# Patient Record
Sex: Female | Born: 1940
Health system: Southern US, Community
[De-identification: ages and names within clinical notes are randomized; demographics above are authoritative.]

## PROBLEM LIST (undated history)

## (undated) DIAGNOSIS — M818 Other osteoporosis without current pathological fracture: Secondary | ICD-10-CM

## (undated) DIAGNOSIS — K219 Gastro-esophageal reflux disease without esophagitis: Secondary | ICD-10-CM

## (undated) DIAGNOSIS — R231 Pallor: Secondary | ICD-10-CM

## (undated) DIAGNOSIS — E042 Nontoxic multinodular goiter: Secondary | ICD-10-CM

## (undated) DIAGNOSIS — C8318 Mantle cell lymphoma, lymph nodes of multiple sites: Secondary | ICD-10-CM

## (undated) DIAGNOSIS — J449 Chronic obstructive pulmonary disease, unspecified: Secondary | ICD-10-CM

## (undated) DIAGNOSIS — E611 Iron deficiency: Secondary | ICD-10-CM

## (undated) DIAGNOSIS — E0520 Thyrotoxicosis with toxic multinodular goiter without thyrotoxic crisis or storm: Secondary | ICD-10-CM

## (undated) DIAGNOSIS — E66811 Obesity, class 1: Secondary | ICD-10-CM

## (undated) DIAGNOSIS — E559 Vitamin D deficiency, unspecified: Secondary | ICD-10-CM

## (undated) DIAGNOSIS — E211 Secondary hyperparathyroidism, not elsewhere classified: Secondary | ICD-10-CM

## (undated) DIAGNOSIS — E669 Obesity, unspecified: Secondary | ICD-10-CM

## (undated) DIAGNOSIS — R432 Parageusia: Secondary | ICD-10-CM

## (undated) DIAGNOSIS — E063 Autoimmune thyroiditis: Secondary | ICD-10-CM

## (undated) DIAGNOSIS — I10 Essential (primary) hypertension: Secondary | ICD-10-CM

## (undated) DIAGNOSIS — Z8572 Personal history of non-Hodgkin lymphomas: Secondary | ICD-10-CM

## (undated) DIAGNOSIS — Z85038 Personal history of other malignant neoplasm of large intestine: Secondary | ICD-10-CM

## (undated) DIAGNOSIS — E782 Mixed hyperlipidemia: Secondary | ICD-10-CM

## (undated) HISTORY — DX: Nontoxic multinodular goiter: E04.2

## (undated) HISTORY — DX: Autoimmune thyroiditis: E06.3

## (undated) HISTORY — DX: Personal history of non-Hodgkin lymphomas: Z85.72

## (undated) HISTORY — DX: Iron deficiency: E61.1

## (undated) HISTORY — DX: Mantle cell lymphoma, lymph nodes of multiple sites: C83.18

## (undated) HISTORY — DX: Thyrotoxicosis with toxic multinodular goiter without thyrotoxic crisis or storm: E05.20

## (undated) HISTORY — DX: Mixed hyperlipidemia: E78.2

## (undated) HISTORY — DX: Other osteoporosis without current pathological fracture: M81.8

## (undated) HISTORY — PX: HAND SURGERY: SHX662

## (undated) HISTORY — DX: Personal history of other malignant neoplasm of large intestine: Z85.038

## (undated) HISTORY — DX: Hypercalcemia: E83.52

## (undated) HISTORY — DX: Pallor: R23.1

## (undated) HISTORY — DX: Vitamin D deficiency, unspecified: E55.9

## (undated) HISTORY — DX: Gastro-esophageal reflux disease without esophagitis: K21.9

## (undated) HISTORY — DX: Secondary hyperparathyroidism, not elsewhere classified: E21.1

## (undated) HISTORY — DX: Obesity, unspecified: E66.9

## (undated) HISTORY — DX: Chronic obstructive pulmonary disease, unspecified: J44.9

## (undated) HISTORY — DX: Obesity, class 1: E66.811

## (undated) HISTORY — DX: Parageusia: R43.2

## (undated) HISTORY — DX: Essential (primary) hypertension: I10

---

## 1998-07-16 ENCOUNTER — Ambulatory Visit (HOSPITAL_COMMUNITY): Admission: RE | Admit: 1998-07-16 | Discharge: 1998-07-16 | Payer: Self-pay | Admitting: Obstetrics & Gynecology

## 2005-06-29 ENCOUNTER — Ambulatory Visit: Payer: Self-pay | Admitting: "Endocrinology

## 2005-09-27 ENCOUNTER — Ambulatory Visit: Payer: Self-pay | Admitting: "Endocrinology

## 2005-12-26 ENCOUNTER — Ambulatory Visit: Payer: Self-pay | Admitting: "Endocrinology

## 2006-05-09 ENCOUNTER — Ambulatory Visit: Payer: Self-pay | Admitting: "Endocrinology

## 2006-10-16 ENCOUNTER — Ambulatory Visit: Payer: Self-pay | Admitting: "Endocrinology

## 2007-04-17 ENCOUNTER — Ambulatory Visit: Payer: Self-pay | Admitting: "Endocrinology

## 2007-10-16 ENCOUNTER — Ambulatory Visit: Payer: Self-pay | Admitting: "Endocrinology

## 2008-01-29 ENCOUNTER — Ambulatory Visit: Payer: Self-pay | Admitting: "Endocrinology

## 2008-05-27 ENCOUNTER — Encounter: Payer: Self-pay | Admitting: Cardiovascular Disease

## 2008-06-02 ENCOUNTER — Encounter: Payer: Self-pay | Admitting: Cardiovascular Disease

## 2008-06-06 ENCOUNTER — Ambulatory Visit: Payer: Self-pay | Admitting: "Endocrinology

## 2008-06-11 ENCOUNTER — Encounter: Payer: Self-pay | Admitting: Cardiovascular Disease

## 2008-12-08 ENCOUNTER — Ambulatory Visit: Payer: Self-pay | Admitting: "Endocrinology

## 2009-01-14 ENCOUNTER — Encounter: Admission: RE | Admit: 2009-01-14 | Discharge: 2009-01-14 | Payer: Self-pay | Admitting: "Endocrinology

## 2009-06-01 ENCOUNTER — Ambulatory Visit: Payer: Self-pay | Admitting: "Endocrinology

## 2009-11-24 ENCOUNTER — Ambulatory Visit: Payer: Self-pay | Admitting: "Endocrinology

## 2009-12-15 ENCOUNTER — Encounter: Payer: Self-pay | Admitting: Cardiovascular Disease

## 2010-01-11 ENCOUNTER — Encounter: Payer: Self-pay | Admitting: Cardiovascular Disease

## 2010-02-11 ENCOUNTER — Ambulatory Visit: Payer: Self-pay | Admitting: Cardiovascular Disease

## 2010-02-11 DIAGNOSIS — E782 Mixed hyperlipidemia: Secondary | ICD-10-CM

## 2010-02-11 DIAGNOSIS — R079 Chest pain, unspecified: Secondary | ICD-10-CM

## 2010-02-11 DIAGNOSIS — I10 Essential (primary) hypertension: Secondary | ICD-10-CM | POA: Insufficient documentation

## 2010-02-11 DIAGNOSIS — R0602 Shortness of breath: Secondary | ICD-10-CM | POA: Insufficient documentation

## 2010-02-11 HISTORY — DX: Chest pain, unspecified: R07.9

## 2010-02-11 HISTORY — DX: Mixed hyperlipidemia: E78.2

## 2010-05-24 ENCOUNTER — Ambulatory Visit: Payer: Self-pay | Admitting: "Endocrinology

## 2010-08-19 ENCOUNTER — Encounter: Payer: Self-pay | Admitting: Cardiology

## 2010-08-24 ENCOUNTER — Encounter: Payer: Self-pay | Admitting: Cardiology

## 2010-10-04 ENCOUNTER — Encounter: Payer: Self-pay | Admitting: Cardiology

## 2010-10-12 ENCOUNTER — Ambulatory Visit
Admission: RE | Admit: 2010-10-12 | Discharge: 2010-10-12 | Payer: Self-pay | Source: Home / Self Care | Attending: Cardiology | Admitting: Cardiology

## 2010-10-12 ENCOUNTER — Encounter: Payer: Self-pay | Admitting: Cardiology

## 2010-10-12 DIAGNOSIS — I77810 Thoracic aortic ectasia: Secondary | ICD-10-CM

## 2010-10-12 DIAGNOSIS — I712 Thoracic aortic aneurysm, without rupture, unspecified: Secondary | ICD-10-CM | POA: Insufficient documentation

## 2010-10-12 HISTORY — DX: Thoracic aortic aneurysm, without rupture, unspecified: I71.20

## 2010-10-12 HISTORY — DX: Thoracic aortic ectasia: I77.810

## 2010-10-14 NOTE — Consult Note (Signed)
Summary: [facility]   Imported By: Marylou Mccoy 02/10/2010 11:51:39  _____________________________________________________________________  External Attachment:    Type:   Image     Comment:   External Document

## 2010-10-14 NOTE — Letter (Signed)
Summary: Dr Glean Hess Revankar's Office Note  Dr Glean Hess Revankar's Office Note   Imported By: Roderic Ovens 03/01/2010 10:13:30  _____________________________________________________________________  External Attachment:    Type:   Image     Comment:   External Document

## 2010-10-14 NOTE — Assessment & Plan Note (Signed)
Summary: NP3/CHEST PAIN-MB   Primary Provider:  Dr. Lucianne Lei  CC:  chest pain.  History of Present Illness: Joy Daniels is seen today at the request of Dr Mathis Bud.  She has had a couple of episodes of atypical SSCP.  The first episode ? a couple of months ago was in the setting of a bronchitic/COPD exacerbation.  It had a slight pleuritic componant and was right sided. It resolved with the bronchitis.  She had a little more pain 2 weeks latter that was self limited and non exertional but not pleuritic this time.  Her history is difficult as she appears to have some memory problems.  She has also been under some stress as her 72 yo daughter and 4 children alre living with her due to credit card problems.  She is currently not having any SSCP.  CRF include well Rx HTN and elevated lipids.  Her activity is limited by COPD/dyspnea and right knee pain.  She has had a recent injection by Dr Mathis Bud.    I reviewed notes from Dr Henrietta Hoover who saw her in 05/2008 for atypical pain.  She had a normal stress myovue at that time.  Since her ECG is normal, symptoms atypical and resolved with normal myovue in 2009 I dont think further w/u is necessary at this time.   Current Problems (verified): None  Current Medications (verified): 1)  Advair Diskus 250-50 Mcg/dose Aepb (Fluticasone-Salmeterol) .... As Directed 2)  Calcitriol 0.25 Mcg Caps (Calcitriol) .Marland Kitchen.. 1 Tab By Mouth Once Daily 3)  Furosemide 20 Mg Tabs (Furosemide) .... Take One Tablet By Mouth Daily. 4)  Losartan Potassium-Hctz 100-25 Mg Tabs (Losartan Potassium-Hctz) .Marland Kitchen.. 1 Tab By Mouth Once Daily 5)  Citracal/vitamin D 250-200 Mg-Unit Tabs (Calcium Citrate-Vitamin D) .Marland Kitchen.. 1 Tab By Mouth Once Daily 6)  Metoprolol Succinate 50 Mg Xr24h-Tab (Metoprolol Succinate) .... Take One Tablet By Mouth Daily 7)  Omeprazole 40 Mg Cpdr (Omeprazole) .Marland Kitchen.. 1 Tab By Mouth Once Daily 8)  Proair Hfa 108 (90 Base) Mcg/act Aers (Albuterol Sulfate) .... As Directed 9)  Potassium  Chloride Cr 10 Meq Cr-Caps (Potassium Chloride) .Marland Kitchen.. 1 Tab By Mouth Once Daily 10)  Singulair 10 Mg Tabs (Montelukast Sodium) .Marland Kitchen.. 1 Tab By Mouth Once Daily 11)  Sertraline Hcl 50 Mg Tabs (Sertraline Hcl) .Marland Kitchen.. 1 Tab By Mouth Once Daily 12)  Vesicare 10 Mg Tabs (Solifenacin Succinate) .Marland Kitchen.. 1 Tab By Mouth Once Daily 13)  Centrum Silver  Tabs (Multiple Vitamins-Minerals) .Marland Kitchen.. 1 Tab By Mouth Once Daily 14)  Vitamin B-12 .Marland Kitchen.. 1 Tab By Mouth Once Daily 15)  Asmanex 30 Metered Doses 220 Mcg/inh Aepb (Mometasone Furoate) .... 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   Vital Signs:  Patient profile:   70 year old female Height:  60 inches Weight:      164 pounds BMI:     32.14 Pulse rate:   88 / minute Resp:     12 per minute BP sitting:   112 / 76  (left arm)  Vitals Entered By: Kem Parkinson (February 11, 2010 3:58 PM)   Impression & Recommendations:  Problem # 1:  DYSPNEA (ICD-786.05) COPD/bronchitis no evidence of cor pulmonale or CHF Her updated medication list for this problem includes:    Furosemide 20 Mg  Tabs (Furosemide) .Marland Kitchen... Take one tablet by mouth daily.    Losartan Potassium-hctz 100-25 Mg Tabs (Losartan potassium-hctz) .Marland Kitchen... 1 tab by mouth once daily    Metoprolol Succinate 50 Mg Xr24h-tab (Metoprolol succinate) .Marland Kitchen... Take one tablet by mouth daily  Problem # 2:  MIXED HYPERLIPIDEMIA (ICD-272.2) Diet Rx F/U primary for lipds  Problem # 3:  ESSENTIAL HYPERTENSION, BENIGN (ICD-401.1) Well controlled Her updated medication list for this problem includes:    Furosemide 20 Mg Tabs (Furosemide) .Marland Kitchen... Take one tablet by mouth daily.    Losartan Potassium-hctz 100-25 Mg Tabs (Losartan potassium-hctz) .Marland Kitchen... 1 tab by mouth once daily    Metoprolol Succinate 50 Mg Xr24h-tab (Metoprolol succinate) .Marland Kitchen... Take one tablet by mouth daily  Problem # 4:  CHEST PAIN UNSPECIFIED (ICD-786.50) Resolved related to COPD flair.  Normal ECG and nonischemic myovue 2009  Continue ASA and BB Observe Her updated medication list for this problem includes:    Metoprolol Succinate 50 Mg Xr24h-tab (Metoprolol succinate) .Marland Kitchen... Take one tablet by mouth daily  Patient Instructions: 1)  Your physician recommends that you schedule a follow-up appointment in: AS NEEDED 2)  FOR KNEE SURGERY-DR FRANK ALUISIO OR DR Lajoyce Corners   EKG Report  Procedure date:  02/11/2010  Findings:      NSR 88 Poor R wave progression ? lead placement Voltage criteria for LVH Otherwise normal

## 2010-10-14 NOTE — Consult Note (Signed)
Summary: [facility]   Imported By: Marylou Mccoy 02/10/2010 11:53:20  _____________________________________________________________________  External Attachment:    Type:   Image     Comment:   External Document

## 2010-10-20 NOTE — Assessment & Plan Note (Signed)
Summary: ec6. recently dx ca. sob. pt has humana gold choice. gd   Primary Provider:  Dr. Lucianne Lei   History of Present Illness: 70 year old female for evaluation of TAA. Myoview in September of 2009 showed an ejection fraction of 64% and normal perfusion. This was performed in [place]. Seen by Dr. Eden Emms in June of 2011 for dyspnea and this was felt secondary to COPD/bronchitis. Patient was recently diagnosed with thoracic aortic aneurysm. This occurred in the setting of a CT scan for mantle cell lymphoma. Apparently it measured 3.8 cm but those records are not available. We were therefore asked to evaluate. She does have chronic dyspnea on exertion but there is no orthopnea or PND. She has had mild edema in the right lower extremity from recent DVT. There is no chest pain, palpitations or syncope.  Current Medications (verified): 1)  Advair Diskus 250-50 Mcg/dose Aepb (Fluticasone-Salmeterol) .... As Directed 2)  Calcitriol 0.25 Mcg Caps (Calcitriol) .Marland Kitchen.. 1 Tab By Mouth Once Daily 3)  Losartan Potassium-Hctz 100-25 Mg Tabs (Losartan Potassium-Hctz) .Marland Kitchen.. 1 Tab By Mouth Once Daily 4)  Citracal/vitamin D 250-200 Mg-Unit Tabs (Calcium Citrate-Vitamin D) .Marland Kitchen.. 1 Tab By Mouth Once Daily 5)  Metoprolol Succinate 50 Mg Xr24h-Tab (Metoprolol Succinate) .... Take One Tablet By Mouth Daily 6)  Omeprazole 40 Mg Cpdr (Omeprazole) .Marland Kitchen.. 1 Tab By Mouth Once Daily 7)  Proair Hfa 108 (90 Base) Mcg/act Aers (Albuterol Sulfate) .... As Directed 8)  Potassium Chloride Cr 10 Meq Cr-Caps (Potassium Chloride) .Marland Kitchen.. 1 Tab By Mouth Once Daily 9)  Singulair 10 Mg Tabs (Montelukast Sodium) .Marland Kitchen.. 1 Tab By Mouth Once Daily 10)  Centrum Silver  Tabs (Multiple Vitamins-Minerals) .Marland Kitchen.. 1 Tab By Mouth Once Daily 11)  Voltaren 1 % Gel (Diclofenac Sodium) .... Uad 12)  Welchol 625 Mg Tabs (Colesevelam Hcl) .... Two Times A Day 13)  Prochlorperazine Maleate 10 Mg Tabs (Prochlorperazine Maleate) .... As Needed 14)  Miralax  Powd  (Polyethylene Glycol 3350) .... As Needed  Allergies (verified): No Known Drug Allergies  Past History:  Past Medical History: H/O dyslipidemeia H/O HTN GERD Hyperthyroid s/p radiation therapy Osteoporosis Allergic Rhinosinustitis Anxiety Postmanapusal H/O  chronic tinnitus S-N deafness Urinary incontinence Sleep apnea Recent DVT COPD Mantle cell lymphoma  Past Surgical History: No previous surgery  Family History: Reviewed history from 02/11/2010 and no changes required. Sister with MI at age 28 Family history of CVA  Social History: Reviewed history from 02/11/2010 and no changes required. Married Nonsmoker No ETOH Sedentary Daughter and her 4 grandchildren living with her  Review of Systems       Fatigue from Recent Chemotherapy but no fevers or chills, productive cough, hemoptysis, dysphasia, odynophagia, melena, hematochezia, dysuria, hematuria, rash, seizure activity, orthopnea, PND,  claudication. Remaining systems are negative.   Vital Signs:  Patient profile:   70 year old female Height:      60 inches Weight:      164 pounds Pulse rate:   97 / minute BP sitting:   120 / 60  (right arm)  Vitals Entered By: Laurance Flatten CMA (October 12, 2010 12:07 PM)  Physical Exam  General:  Well-developed well-nourished in no acute distress.  Skin is warm and dry.  HEENT is normal.  Neck is supple. No thyromegaly.  Chest is clear to auscultation with normal expansion.  Cardiovascular exam is regular rate and rhythm.  Abdominal exam nontender or distended. No masses palpated. Extremities show no edema. neuro grossly intact    EKG  Procedure date:  10/12/2010  Findings:      Sinus rhythm at a rate of 97. Left ventricular hypertrophy. Nonspecific ST changes.  Impression & Recommendations:  Problem # 1:  THORACIC AORTIC ANEURYSM (ICD-441.2) Will review the patient's CT scan and have records forwarded from [facility]. Per the patient it measured 3.8  cm. Plan followup study in one year. No indication for intervention.  Problem # 2:  DYSPNEA (ICD-786.05) Felt previously secondary to COPD. No change in symptoms. Previous negative Myoview. The following medications were removed from the medication list:    Furosemide 20 Mg Tabs (Furosemide) .Marland Kitchen... Take one tablet by mouth daily. Her updated medication list for this problem includes:    Losartan Potassium-hctz 100-25 Mg Tabs (Losartan potassium-hctz) .Marland Kitchen... 1 tab by mouth once daily    Metoprolol Succinate 50 Mg Xr24h-tab (Metoprolol succinate) .Marland Kitchen... Take one tablet by mouth daily  Problem # 3:  MIXED HYPERLIPIDEMIA (ICD-272.2) Management per primary care. Her updated medication list for this problem includes:    Welchol 625 Mg Tabs (Colesevelam hcl) .Marland Kitchen..Marland Kitchen Two times a day  Problem # 4:  ESSENTIAL HYPERTENSION, BENIGN (ICD-401.1) Blood pressure controlled. Continue present medications. Lipids and liver monitored by primary care. The following medications were removed from the medication list:    Furosemide 20 Mg Tabs (Furosemide) .Marland Kitchen... Take one tablet by mouth daily. Her updated medication list for this problem includes:    Losartan Potassium-hctz 100-25 Mg Tabs (Losartan potassium-hctz) .Marland Kitchen... 1 tab by mouth once daily    Metoprolol Succinate 50 Mg Xr24h-tab (Metoprolol succinate) .Marland Kitchen... Take one tablet by mouth daily

## 2010-10-25 ENCOUNTER — Telehealth (INDEPENDENT_AMBULATORY_CARE_PROVIDER_SITE_OTHER): Payer: Self-pay | Admitting: *Deleted

## 2010-11-03 NOTE — Progress Notes (Signed)
Summary: DVD from [facility]  Received DVD of CT Chest-Abd-Pelvis from 10/04/10 & CT Chest 08/31/10. Place in black box.

## 2010-11-09 NOTE — Letter (Signed)
[facility]: [facility]   Imported By: Marylou Mccoy 11/05/2010 15:44:14  _____________________________________________________________________  External Attachment:    Type:   Image     Comment:   External Document

## 2010-11-09 NOTE — Letter (Signed)
Summary: [facility]   Imported By: Marylou Mccoy 11/05/2010 15:51:40  _____________________________________________________________________  External Attachment:    Type:   Image     Comment:   External Document

## 2010-11-15 ENCOUNTER — Ambulatory Visit (INDEPENDENT_AMBULATORY_CARE_PROVIDER_SITE_OTHER): Payer: Medicare PPO | Admitting: Pediatrics

## 2010-11-15 DIAGNOSIS — E559 Vitamin D deficiency, unspecified: Secondary | ICD-10-CM

## 2010-11-15 DIAGNOSIS — E210 Primary hyperparathyroidism: Secondary | ICD-10-CM

## 2010-11-15 DIAGNOSIS — E063 Autoimmune thyroiditis: Secondary | ICD-10-CM

## 2011-01-27 ENCOUNTER — Encounter: Payer: Self-pay | Admitting: *Deleted

## 2011-04-28 ENCOUNTER — Other Ambulatory Visit: Payer: Self-pay | Admitting: "Endocrinology

## 2011-05-18 ENCOUNTER — Ambulatory Visit (INDEPENDENT_AMBULATORY_CARE_PROVIDER_SITE_OTHER): Payer: Medicare PPO | Admitting: "Endocrinology

## 2011-05-18 VITALS — BP 124/78 | HR 71 | Wt 173.0 lb

## 2011-05-18 DIAGNOSIS — E211 Secondary hyperparathyroidism, not elsewhere classified: Secondary | ICD-10-CM

## 2011-05-18 DIAGNOSIS — K3189 Other diseases of stomach and duodenum: Secondary | ICD-10-CM

## 2011-05-18 DIAGNOSIS — R432 Parageusia: Secondary | ICD-10-CM

## 2011-05-18 DIAGNOSIS — R439 Unspecified disturbances of smell and taste: Secondary | ICD-10-CM

## 2011-05-18 DIAGNOSIS — R1013 Epigastric pain: Secondary | ICD-10-CM

## 2011-05-18 DIAGNOSIS — E559 Vitamin D deficiency, unspecified: Secondary | ICD-10-CM

## 2011-05-18 DIAGNOSIS — E213 Hyperparathyroidism, unspecified: Secondary | ICD-10-CM

## 2011-05-18 DIAGNOSIS — E063 Autoimmune thyroiditis: Secondary | ICD-10-CM

## 2011-05-18 NOTE — Patient Instructions (Signed)
Followup visit in 6 months. Please continue to take her calcitriol, calcium, vitamin D.

## 2011-05-19 LAB — T3, FREE: T3, Free: 2.6 pg/mL (ref 2.3–4.2)

## 2011-05-19 LAB — TSH: TSH: 2.497 u[IU]/mL (ref 0.350–4.500)

## 2011-05-19 LAB — PTH, INTACT AND CALCIUM
Calcium, Total (PTH): 10.4 mg/dL (ref 8.4–10.5)
PTH: 64.2 pg/mL (ref 14.0–72.0)

## 2011-05-19 LAB — VITAMIN D 25 HYDROXY (VIT D DEFICIENCY, FRACTURES): Vit D, 25-Hydroxy: 36 ng/mL (ref 30–89)

## 2011-05-19 LAB — T4, FREE: Free T4: 1.04 ng/dL (ref 0.80–1.80)

## 2011-05-21 LAB — VITAMIN D 1,25 DIHYDROXY: Vitamin D2 1, 25 (OH)2: 17 pg/mL

## 2011-06-21 ENCOUNTER — Telehealth: Payer: Self-pay | Admitting: *Deleted

## 2011-06-21 NOTE — Telephone Encounter (Signed)
T/C to patient with 05/18/11 Lab results.  Per Dr. Fransico Michael: 1. 25 Vit. D and 1,25 Vit. D are normal. 2. TFT levels are normal. 3. PTH is high normal. 4. Follow-up as planned.  Patient requests copy of these results be faxed to Dr. Lucianne Lei.

## 2011-10-21 ENCOUNTER — Encounter: Payer: Self-pay | Admitting: "Endocrinology

## 2011-10-21 DIAGNOSIS — C8318 Mantle cell lymphoma, lymph nodes of multiple sites: Secondary | ICD-10-CM | POA: Insufficient documentation

## 2011-10-21 DIAGNOSIS — E211 Secondary hyperparathyroidism, not elsewhere classified: Secondary | ICD-10-CM | POA: Insufficient documentation

## 2011-10-21 DIAGNOSIS — I10 Essential (primary) hypertension: Secondary | ICD-10-CM | POA: Insufficient documentation

## 2011-10-21 DIAGNOSIS — E063 Autoimmune thyroiditis: Secondary | ICD-10-CM | POA: Insufficient documentation

## 2011-10-21 DIAGNOSIS — M818 Other osteoporosis without current pathological fracture: Secondary | ICD-10-CM | POA: Insufficient documentation

## 2011-10-21 DIAGNOSIS — E0520 Thyrotoxicosis with toxic multinodular goiter without thyrotoxic crisis or storm: Secondary | ICD-10-CM | POA: Insufficient documentation

## 2011-10-21 DIAGNOSIS — E669 Obesity, unspecified: Secondary | ICD-10-CM | POA: Insufficient documentation

## 2011-10-21 DIAGNOSIS — E782 Mixed hyperlipidemia: Secondary | ICD-10-CM | POA: Insufficient documentation

## 2011-10-21 DIAGNOSIS — R432 Parageusia: Secondary | ICD-10-CM | POA: Insufficient documentation

## 2011-10-21 DIAGNOSIS — E042 Nontoxic multinodular goiter: Secondary | ICD-10-CM | POA: Insufficient documentation

## 2011-10-21 DIAGNOSIS — E611 Iron deficiency: Secondary | ICD-10-CM | POA: Insufficient documentation

## 2011-10-21 DIAGNOSIS — K219 Gastro-esophageal reflux disease without esophagitis: Secondary | ICD-10-CM | POA: Insufficient documentation

## 2011-10-21 DIAGNOSIS — R231 Pallor: Secondary | ICD-10-CM | POA: Insufficient documentation

## 2011-10-21 DIAGNOSIS — J449 Chronic obstructive pulmonary disease, unspecified: Secondary | ICD-10-CM | POA: Insufficient documentation

## 2011-10-21 DIAGNOSIS — E559 Vitamin D deficiency, unspecified: Secondary | ICD-10-CM | POA: Insufficient documentation

## 2011-10-21 NOTE — Progress Notes (Addendum)
Subjective:  Patient Name: Joy Daniels Date of Birth: [birth year]  MRN: [id number]  Viha Kriegel  presents to the office today for follow-up of her secondary hyperparathyroidism, hypocalcemia, vitamin D deficiency, iron deficiency, dysgeusia, dyspepsia, and nontoxic multinodular goiter.  HISTORY OF PRESENT ILLNESS:   Joy Daniels is a 71 y.o. Caucasian woman.  Joy Daniels was accompanied by her sister.   1. The patient was first referred to me on 02/01/05 by her primary care physician, Dr. Samuel Germany, for followup of toxic nodular goiter. The patient was then 71 years of age.   A.  The patient had been previously evaluated by Dr. Lillia Mountain, an adultt endocrinologist based in [place]. Dr. Cheryll Cockayne was performing an outreach clinic at the [facility] in [place], [place]. He evaluated the patient for hyperthyroidism and found that she had a toxic nodular goiter. He arranged for her to have radioactive iodine treatment on 12/13/04 with 24.5 mCi of I-131. When Dr. Cheryll Cockayne retired, I was asked to handle the outreach clinic for the next several months. At the patient's first visit with me, many of her hyperthyroid symptoms and signs had significantly improved. Past medical history was positive for hypertension, osteoporosis, hip pains, and GERD. She had a previous injury to her right hand that required surgical repair. Family history was positive for cancer in one sister and a heart attack in another sister. Her brother had diabetes and had a stroke. On physical examination she was hypertensive. She had a 25+ gram goiter. She also had pallor of her nailbeds of her fingers.   B. At her next followup visit in [place] on 03/29/05, she felt much better. Her energy had been much improved. Muscle strength was improving. She was still significantly bothered by her excessive belly hunger that was causing weight gain. Recent TFTs from 03/22/05 were normal, with a TSH of 1.11, a free T4 of 1.22, and a free T3 of 2.8. I started her on  Nexium 40 mg per day. Since I was getting ready to terminate the outreach clinic in [place], I made arrangements for her to be followed at my clinic in [place].  2. During the last 6 years that I have followed her here in [place], we dealt was several different problem areas:  A. I followed two aspects of her multinodular goiter, the nodules themselves and her thyroid hormone status.   1. A thyroid ultrasound performed on 10/26/06 showed a 1.5 cm dominant hyperechoic mass in the left upper pole. There were 2 smaller nodules in the right lobe measuring 6.9 and 7.6 mm in diameter. On 12/14/06 a fine needle aspirate was performed on the dominant nodule in the left upper lobe. The pathologists diagnosis was: "Rare benign follicular cells, abundant histiocytes and colloid. No neoplasms identified." A followup thyroid ultrasound performed on 11/05/07 showed an echogenic nodule in the mid left lobe measuring 1.7 x 1.1 x 0.8 cm. This was not grossly changed from images obtained during the previous fine needle aspirate. There was also a smaller nodules in the left lobe measuring 1.3 x 0.6 cm.  In the inferior aspect of the right lobe there was a nodule measuring 1.1 x 0.7 x 0.5. Her next thyroid ultrasound performed on 01/14/2009 showed a partially calcified nodule in the lower pole of the right lobe measuring 1.8 x 0.6 x 0.8. By report, this nodule had increased somewhat in size. However, because the images from the 2009 study were not available, no direct comparison could be made. The largest nodule in the left  lobe measured 1.6 x 1.0 x 0.8., essentially unchanged from the previous study.   2. The patient remained euthyroid through the spring of 2010. In September of 2010 and again in April of 2011 she became mildly hypothyroid. It appeared that she was having some flareups of Hashimoto's disease at those times. Her TFTs subsequently normalized on 11/26/10.  B. Because of her known osteoporosis, I wanted to treat  her with a bisphosphonate. Since she had previously not tolerated Actonel well, she was reluctant. I finally convinced her to try Boniva, but she later discontinued that medication as well. She was not at all interested in doing Forteo injections. To further try to minimize bone loss, I monitored her bone mineral parameters closely. Up through her visit on 04/17/07 her parameters were normal, but she had many difficulties in taking all of her calcium and vitamin D pills, in part due to constipation. Unfortunately, by 12/03/07 her 25-hydroxy vitamin D dropped to 24.2 and her 1,25- dihydroxy vitamin D dropped to 23.3. It appeared not only was she deficient in vitamin D intake, but her kidneys were also not optimally converting 25 vitamin D to 1, 25 hydroxy vitamin D. I prescribed vitamin D, 50,000 IU per week. I also added calcitriol, 0.25 mcg per week. When she took these medications, her bone parmeters normalized. In early 2010, however, she had stopped taking these medications. Her PTH rose to 75 and then to 81.8. When she again took her calcium and her two vitamin D preparations, her PTH decreased progressively to 62.1 and then to 33 on 11/26/10. On that date, her serum calcium was 10.2, her 25-hydroxy vitamin D was 32.2, and her 1, 25- dihydroxy vitamin D was 28.2. By then, however, she had stopped taking the vitamin D 50,000 international unit weekly because her insurance company would not cover it.  C. 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 She began chemotherapy and 11/2 soft/11. He is receiving her cancer treatments at the [facility]. 3. The patient's last PSSG visit was on 11/25/10. In the interim, at her recent CT scan her lymph nodes looked "fine". She has been told she is "lymphoma free". She still taking chemotherapy for 8 hours every month. 4. Pertinent Review of Systems:  Constitutional: The patient feels tired all  the time. She is "lazy".  Eyes: Vision is good with her glasses. Her eyes are dry all the time. Eyelids often seemed to stick to the eyes. Mouth: Mouth is also quite dry. But still working better. Neck: She has some left lower anterior neck soreness dramatically. She has no complaints of difficulty swallowing.  Heart: Heart rate increases with exercise or other physical activity. The patient has no complaints of palpitations, irregular heat beats, chest pain, or chest pressure. Lungs: Her COPD is still a major issue. She frequently gets short of breath even with minor exertion. Gastrointestinal: She has occasional constipation. The patient has no complaints of excessive hunger, acid reflux, upset stomach, stomach aches or pains, or diarrhea. Legs: Muscle mass and strength seem normal. There are no complaints of numbness, tingling, burning, or pain. No edema is noted. Feet: There are no obvious foot problems. There are no complaints of numbness, tingling, burning, or pain. No edema is noted.   PAST MEDICAL, FAMILY, AND SOCIAL HISTORY:  Past Medical History  Diagnosis Date  . Toxic nodular goiter   . Other osteoporosis   . GERD (gastroesophageal reflux disease)   .  Iron deficiency   . Pallor   . Combined hyperlipidemia   . Hypercalcemia   . Nontoxic multinodular goiter   . Vitamin D deficiency disease   . Hyperparathyroidism , secondary, non-renal   . Thyroiditis, autoimmune   . Hypothyroidism, acquired, autoimmune   . COPD (chronic obstructive pulmonary disease)   . Dysgeusia   . Lymphoma, mantle cell, multiple sites   . Hypertension   . Obesity (BMI 30.0-34.9)     Family History  Problem Relation Age of Onset  . Diabetes Father   . Cancer Sister     Current outpatient prescriptions:calcitRIOL (ROCALTROL) 0.25 MCG capsule, TAKE 1 CAPSULE WEEKLY, Disp: 12 capsule, Rfl: 6;  furosemide (LASIX) 20 MG tablet, Take 20 mg by mouth 2 (two) times daily.  , Disp: , Rfl: ;  metoprolol  (LOPRESSOR) 50 MG tablet, Take 50 mg by mouth daily.  , Disp: , Rfl: ;  omeprazole (PRILOSEC) 10 MG capsule, Take 10 mg by mouth daily.  , Disp: , Rfl:  sertraline (ZOLOFT) 50 MG tablet, Take 50 mg by mouth daily.  , Disp: , Rfl:   Allergies as of 05/18/2011 - Review Complete 05/18/2011  Allergen Reaction Noted  . Codeine  01/27/2011    1. Work and Family: Her family is supportive. 2. Activities: She "sits a lot". She does a little bit of house cleaning. 3. Smoking, alcohol, or drugs: Unknown 4. Primary Care Provider: Dr. Coralee North B. Uppin  ROS: There are no other significant problems involving Joy Daniels's other body systems.   Objective:  Vital Signs:  BP 124/78  Pulse 71  Wt 173 lb (78.472 kg)   Ht Readings from Last 3 Encounters:  10/12/10 5' (1.524 m)  02/11/10 5' (1.524 m)   Wt Readings from Last 3 Encounters:  05/18/11 173 lb (78.472 kg)  10/12/10 164 lb (74.39 kg)  02/11/10 164 lb (74.39 kg)   PHYSICAL EXAM:  Constitutional: The patient appears tired.   Face: The face appears normal.  Eyes: There is no obvious arcus or proptosis. Moisture appears normal. Mouth: The oropharynx and tongue appear normal. Oral moisture is normal. Neck: The neck appears to be visibly normal. No carotid bruits are noted. The thyroid gland is low-lying. The thyroid gland is approximately 20 grams in size. The consistency of the thyroid gland is normal. The left lobe of the thyroid gland is 1+ tender to palpation. Lungs: The lungs are clear to auscultation. Air movement is good. Heart: Heart rate and rhythm are regular. Heart sounds S1 and S2 are normal. I did not appreciate any pathologic cardiac murmurs. Abdomen: The abdomen appears to be normal in size. Bowel sounds are normal. There is no obvious hepatomegaly, splenomegaly, or other mass effect.  Arms: Muscle size and bulk are normal for age. Hands: There is no obvious tremor. Phalangeal and metacarpophalangeal joints are normal. Palmar muscles  are normal. Palmar skin is normal. Palmar moisture is also normal. Legs: Muscles appear normal for age. She has trace leg edema.  Neurologic: Strength is normal for age in both the upper and lower extremities. Muscle tone is normal. Sensation to touch is normal in both legs.    LAB DATA: 11/16/10 as noted above.   Assessment and Plan:   ASSESSMENT:  1. Obesity: The patient has gained 9 pounds recently. Some of that is water weight. 2. Vitamin D deficiency: Patient is doing well in March. Unfortunately, she stopped her 50.000 units of vitamin D per week. Based upon her past history, it  is unlikely that she will take as many calcium and vitamin D pills daily as she will need. We will see. 3. Thyroiditis: Her Hashimoto's disease is active today. She was euthyroid in March. I suspect that she will lose more thyroid cells over time and eventually become permanently hypothyroid. 4. Dyspepsia: She is doing better. 5. Hyperparathyroidism, secondary to calcium and vitamin D intake deficiency: She was doing very well in March. 6. Dysgeusia: Surprisingly, despite her chemotherapy, this problem has resolved.  PLAN:  1. Diagnostic: TFTs, PTH, calcium, both vitamin D's 2. Therapeutic: Please take all of her medications, to include her calcitriol, calcium, and vitamin D.  3. Patient education: Now that she is recovering from her lymphoma, it is still important to protect her bones the best way we can. We need to keep up her intake of calcium and vitamin D. 4. Follow-up: Return in about 6 months (around 11/15/2011).  Level of Service: This visit lasted in excess of 40 minutes. More than 50% of the visit was devoted to counseling.  David Stall, MD 10/21/2011 9:25 AM  Adendum: 1. Lab results from 05/18/11: Calcium was 10.4. PT to 64.2. 25 hydroxy vitamin D was 36. 1, 25 hydroxy vitamin D was 47. TSH was 2.497. Free T4 was 1.04. Free T3 was 2.6. 2. Results were called to patient on  06/21/11. David Stall

## 2011-11-17 ENCOUNTER — Ambulatory Visit (INDEPENDENT_AMBULATORY_CARE_PROVIDER_SITE_OTHER): Payer: Medicare PPO | Admitting: "Endocrinology

## 2011-11-17 ENCOUNTER — Encounter: Payer: Self-pay | Admitting: "Endocrinology

## 2011-11-17 VITALS — BP 132/82 | HR 96 | Wt 180.0 lb

## 2011-11-17 DIAGNOSIS — R432 Parageusia: Secondary | ICD-10-CM

## 2011-11-17 DIAGNOSIS — E211 Secondary hyperparathyroidism, not elsewhere classified: Secondary | ICD-10-CM

## 2011-11-17 DIAGNOSIS — E559 Vitamin D deficiency, unspecified: Secondary | ICD-10-CM

## 2011-11-17 DIAGNOSIS — R5381 Other malaise: Secondary | ICD-10-CM

## 2011-11-17 DIAGNOSIS — C8589 Other specified types of non-Hodgkin lymphoma, extranodal and solid organ sites: Secondary | ICD-10-CM

## 2011-11-17 DIAGNOSIS — E669 Obesity, unspecified: Secondary | ICD-10-CM

## 2011-11-17 DIAGNOSIS — C8590 Non-Hodgkin lymphoma, unspecified, unspecified site: Secondary | ICD-10-CM

## 2011-11-17 DIAGNOSIS — R439 Unspecified disturbances of smell and taste: Secondary | ICD-10-CM

## 2011-11-17 DIAGNOSIS — R1013 Epigastric pain: Secondary | ICD-10-CM

## 2011-11-17 DIAGNOSIS — E049 Nontoxic goiter, unspecified: Secondary | ICD-10-CM

## 2011-11-17 DIAGNOSIS — R5383 Other fatigue: Secondary | ICD-10-CM

## 2011-11-17 DIAGNOSIS — E069 Thyroiditis, unspecified: Secondary | ICD-10-CM

## 2011-11-17 NOTE — Progress Notes (Signed)
Subjective:  Patient Name: Joy Daniels Date of Birth: February 14, 1941  MRN: [id number]  Joy Daniels  presents to the office today for follow-up of her secondary hyperparathyroidism, hypocalcemia, vitamin D deficiency, iron deficiency, dysgeusia, dyspepsia, and nontoxic multinodular goiter.  HISTORY OF PRESENT ILLNESS:   Joy Daniels is a 71 y.o. Caucasian woman.  Joy Daniels was accompanied by her friend.   1. The patient was first referred to me on 02/01/05 by her primary care physician, Dr. Samuel Germany, for followup of toxic nodular goiter. The patient was then 71 years of age.   A.  The patient had been previously evaluated by Dr. Lillia Mountain, an adult endocrinologist based in [place]. Dr. Cheryll Cockayne was performing an outreach clinic at the [facility] in [place], [place]. He evaluated the patient for hyperthyroidism and found that she had a toxic nodular goiter. He arranged for her to have radioactive iodine treatment on 12/13/04 with 24.5 mCi of I-131. When Dr. Cheryll Cockayne retired, I was asked to handle the outreach clinic for the next several months. At the patient's first visit with me, many of her hyperthyroid symptoms and signs had significantly improved. Past medical history was positive for hypertension, osteoporosis, hip pains, and GERD. She had a previous injury to her right hand that required surgical repair. Family history was positive for cancer in one sister and a heart attack in another sister. Her brother had diabetes and had a stroke. On physical examination she was hypertensive. She had a 25+ gram goiter. She also had pallor of the nailbeds of her fingers.   B. At her next followup visit in [place] on 03/29/05, she felt much better. Her energy had been much improved. Muscle strength was improving. She was still significantly bothered by her excessive belly hunger that was causing weight gain. Recent TFTs from 03/22/05 were normal, with a TSH of 1.11, a free T4 of 1.22, and a free T3 of 2.8. I started her on  Nexium 40 mg per day. Since I was getting ready to terminate the outreach clinic in [place], I made arrangements for her to be followed at my clinic in [place].  2. During the last 6 years that I have followed her here in [place], we dealt with several different problem areas:  A. I followed two aspects of her multinodular goiter, the nodules themselves and her thyroid hormone status.   1. A thyroid ultrasound performed on 10/26/06 showed a 1.5 cm dominant hyperechoic mass in the left upper pole. There were 2 smaller nodules in the right lobe measuring 6.9 and 7.6 mm in diameter. On 12/14/06 a fine needle aspirate was performed on the dominant nodule in the left upper lobe. The pathologists diagnosis was: "Rare benign follicular cells, abundant histiocytes and colloid. No neoplasms identified." A followup thyroid ultrasound performed on 11/05/07 showed an echogenic nodule in the mid left lobe measuring 1.7 x 1.1 x 0.8 cm. This was not grossly changed from images obtained during the previous fine needle aspirate. There was also a smaller nodule in the left lobe measuring 1.3 x 0.6 cm.  In the inferior aspect of the right lobe there was a nodule measuring 1.1 x 0.7 x 0.5. Her next thyroid ultrasound performed on 01/14/2009 showed a partially calcified nodule in the lower pole of the right lobe measuring 1.8 x 0.6 x 0.8. By report, this nodule had increased somewhat in size. However, because the images from the 2009 study were not available, no direct comparison could be made. The largest nodule in the left  lobe measured 1.6 x 1.0 x 0.8., essentially unchanged from the previous study.   2. The patient remained euthyroid through the spring of 2010. In September of 2010 and again in April of 2011 she became mildly hypothyroid. It appeared that she was having some flareups of Hashimoto's disease at those times. Her TFTs subsequently normalized on 11/26/10.  B. Because of her known osteoporosis, I wanted to treat  her with a bisphosphonate. Since she had previously not tolerated Actonel well, she was reluctant. I finally convinced her to try Boniva, but she later discontinued that medication as well. She was not at all interested in doing Forteo injections. To further try to minimize bone loss, I monitored her bone mineral parameters closely. Up through her visit on 04/17/07 her parameters were normal, but she had many difficulties in taking all of her calcium and vitamin D pills, in part due to constipation. Unfortunately, by 12/03/07 her 25-hydroxy vitamin D dropped to 24.2 and her 1,25- dihydroxy vitamin D dropped to 23.3. It appeared not only was she deficient in vitamin D intake, but her kidneys were also not optimally converting 25-hydroxy vitamin D to 1, 25-dihydroxy hydroxy vitamin D. I prescribed vitamin D, 50,000 IU per week. I also added calcitriol, 0.25 mcg per week. When she took these medications, her bone parameters normalized. In early 2010, however, she had stopped taking these medications. Her PTH rose to 75 and then to 81.8. When she again took her calcium and her two vitamin D preparations, her PTH decreased progressively to 62.1 and then to 33 on 11/26/10. On that date, her serum calcium was 10.2, her 25-hydroxy vitamin D was 32.2, and her 1, 25- dihydroxy vitamin D was 28.2. By then, however, she had stopped taking the vitamin D 50,000 international unit weekly because her insurance company would not cover it.  C. 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 She began chemotherapy and 07/14/10. She is receiving her cancer treatments at the [facility]. She will receive chemotherapy every other month for two years. 3. The patient's last PSSG visit was on 05/18/11. In the interim, she had more chemotherapy in January. She is still in remission. She will have another CT scan and round of chemotherapy later this month. She has  had a recent URI. She still feels tired and weak, but is better. She has yet to regain her strength. 4. Pertinent Review of Systems:  Constitutional: The patient feels tired a lot. She gets short of breath with exertion due to her COPD.   Eyes: Vision is good with her glasses. Her eyes are dry a lot. Eyelids sometimes seem to stick to the eyes. She knows that she should use eye drops, but she is not bothered enough to do so.  Mouth: Mouth is also quite dry, but is better. Neck: She has some left mid-neck "cramps". She has no complaints of difficulty swallowing.  Heart: Heart rate increases with exercise or other physical activity. The patient has no complaints of palpitations, irregular heat beats, chest pain, or chest pressure. Lungs: Her COPD is still a major issue. She gets short of breath even with minor exertion. Gastrointestinal: She has occasional constipation. The patient has no complaints of excessive hunger, acid reflux, upset stomach, stomach aches or pains, diarrhea, or constipation. Legs: Muscle mass and strength seem normal. There are no complaints of numbness, tingling, or burning. She has a "bad" right knee. She has  some edema, always worse on the right.  Feet: There are no obvious foot problems. There are no complaints of numbness, tingling, burning, or pain. No edema is noted.   PAST MEDICAL, FAMILY, AND SOCIAL HISTORY:  Past Medical History  Diagnosis Date  . Toxic nodular goiter   . Other osteoporosis   . GERD (gastroesophageal reflux disease)   . Iron deficiency   . Pallor   . Combined hyperlipidemia   . Hypercalcemia   . Nontoxic multinodular goiter   . Vitamin D deficiency disease   . Hyperparathyroidism , secondary, non-renal   . Thyroiditis, autoimmune   . Hypothyroidism, acquired, autoimmune   . COPD (chronic obstructive pulmonary disease)   . Dysgeusia   . Lymphoma, mantle cell, multiple sites   . Hypertension   . Obesity (BMI 30.0-34.9)     Family  History  Problem Relation Age of Onset  . Diabetes Father   . Cancer Sister   . Heart disease Sister     Current outpatient prescriptions:calcitRIOL (ROCALTROL) 0.25 MCG capsule, TAKE 1 CAPSULE WEEKLY, Disp: 12 capsule, Rfl: 6;  furosemide (LASIX) 20 MG tablet, Take 20 mg by mouth 2 (two) times daily.  , Disp: , Rfl: ;  metoprolol (LOPRESSOR) 50 MG tablet, Take 50 mg by mouth daily.  , Disp: , Rfl: ;  omeprazole (PRILOSEC) 10 MG capsule, Take 10 mg by mouth daily.  , Disp: , Rfl:  sertraline (ZOLOFT) 50 MG tablet, Take 50 mg by mouth daily.  , Disp: , Rfl:   Allergies as of 11/17/2011 - Review Complete 11/17/2011  Allergen Reaction Noted  . Codeine  01/27/2011    1. Work and Family: Her family is supportive. 2. Activities: She "sits a lot". She does a little bit of house cleaning. 3. Smoking, alcohol, or drugs: Unknown 4. Primary Care Provider: Dr. Coralee North B. Uppin  ROS: There are no other significant problems involving Joy Daniels other body systems.   Objective:  Vital Signs:  BP 132/82  Pulse 96  Wt 180 lb (81.647 kg)   Ht Readings from Last 3 Encounters:  10/12/10 5' (1.524 m)  02/11/10 5' (1.524 m)   Wt Readings from Last 3 Encounters:  11/17/11 180 lb (81.647 kg)  05/18/11 173 lb (78.472 kg)  10/12/10 164 lb (74.39 kg)   PHYSICAL EXAM:  Constitutional: The patient really looks good today. .   Face: The face appears normal.  Eyes: There is no obvious arcus or proptosis. Moisture appears normal. Mouth: The oropharynx and tongue appear normal. Oral moisture is normal. Neck: The neck appears to be visibly normal. No carotid bruits are noted. The thyroid gland is low-lying. The thyroid gland is 18-20 grams in size. The consistency of the thyroid gland is normal. The thyroid gland is not tender to palpation. Lungs: The lungs are clear to auscultation. Air movement is good. Heart: Heart rate and rhythm are regular. Heart sounds S1 and S2 are normal. I did not appreciate any  pathologic cardiac murmurs. Abdomen: The abdomen is enlarged. Bowel sounds are normal. There is no obvious hepatomegaly, splenomegaly, or other mass effect.  Arms: Muscle size and bulk are normal for age. Hands: There is no obvious tremor. Phalangeal and metacarpophalangeal joints are normal. Palmar muscles are normal. Palmar skin is normal. Palmar moisture is also normal. Legs: Muscles appear normal for age. She has trace leg edema on the right, none on the left.  Neurologic: Strength is normal for age in both the upper and lower extremities. Muscle  tone is normal. Sensation to touch is normal in both legs.    LAB DATA: 05/19/11: PTH 64, calcium 10.4, 25-hydroxy vitamin D 36, 1,25-dihydroxy vitamin D 47, TSH 2.497, free T4 1.04, free T3 2.6   Assessment and Plan:   ASSESSMENT:  1. Obesity: The patient has gained 16 pounds in 14 months. She is still taking in more starches and sugars than she needs. She is taking in about 125 excess calories per day.  2. Vitamin D deficiency: Patient was doing well in March and September.  3. Thyroiditis: Her Hashimoto's disease is clinically quiescent today.  4. Dyspepsia: She is doing better. 5. Hyperparathyroidism, secondary to calcium and vitamin D intake deficiency: Her PTH was in the upper quartile of normal  6. Dysgeusia: She still has some problems, but is doing much better. Vitamin B6 will help. 7. Goiter: Her thyroid gland is smaller today. The waxing and waning of the thyroid gland size is another indicator of Hashimoto's disease. 8. Fatigue: Patient is doing much better. 9. Lymphoma: her lymphoma is in remission.  PLAN:  1. Diagnostic: TFTs, PTH, calcium, both vitamin D's 2. Therapeutic: Please take all of her medications, to include her calcitriol, calcium, and vitamin D. Take a multivitamin pill daily. 3. Patient education: Now that she is recovering from her lymphoma, it is still important to protect her bones the best way we can. We need to  keep up her intake of calcium and vitamin D. 4. Follow-up: 6 months  Level of Service: This visit lasted in excess of 40 minutes. More than 50% of the visit was devoted to counseling.  David Stall, MD 11/17/2011 9:04 AM

## 2011-11-17 NOTE — Patient Instructions (Signed)
Follow-up visit in 6 months. Please take a good multivitamin daily. Pleas try to fit in a walk every day.

## 2011-11-18 LAB — TSH: TSH: 2.312 u[IU]/mL (ref 0.350–4.500)

## 2011-11-18 LAB — VITAMIN D 25 HYDROXY (VIT D DEFICIENCY, FRACTURES): Vit D, 25-Hydroxy: 40 ng/mL (ref 30–89)

## 2011-11-20 LAB — VITAMIN D 1,25 DIHYDROXY
Vitamin D 1, 25 (OH)2 Total: 67 pg/mL (ref 18–72)
Vitamin D2 1, 25 (OH)2: 26 pg/mL
Vitamin D3 1, 25 (OH)2: 41 pg/mL

## 2012-06-11 ENCOUNTER — Encounter: Payer: Self-pay | Admitting: "Endocrinology

## 2012-06-11 ENCOUNTER — Ambulatory Visit (INDEPENDENT_AMBULATORY_CARE_PROVIDER_SITE_OTHER): Payer: Medicare PPO | Admitting: "Endocrinology

## 2012-06-11 VITALS — BP 134/63 | HR 87 | Wt 175.4 lb

## 2012-06-11 DIAGNOSIS — R1013 Epigastric pain: Secondary | ICD-10-CM

## 2012-06-11 DIAGNOSIS — K3189 Other diseases of stomach and duodenum: Secondary | ICD-10-CM

## 2012-06-11 DIAGNOSIS — R439 Unspecified disturbances of smell and taste: Secondary | ICD-10-CM

## 2012-06-11 DIAGNOSIS — E049 Nontoxic goiter, unspecified: Secondary | ICD-10-CM

## 2012-06-11 DIAGNOSIS — J069 Acute upper respiratory infection, unspecified: Secondary | ICD-10-CM

## 2012-06-11 DIAGNOSIS — E211 Secondary hyperparathyroidism, not elsewhere classified: Secondary | ICD-10-CM

## 2012-06-11 DIAGNOSIS — E669 Obesity, unspecified: Secondary | ICD-10-CM

## 2012-06-11 DIAGNOSIS — E063 Autoimmune thyroiditis: Secondary | ICD-10-CM

## 2012-06-11 DIAGNOSIS — R5383 Other fatigue: Secondary | ICD-10-CM

## 2012-06-11 DIAGNOSIS — E559 Vitamin D deficiency, unspecified: Secondary | ICD-10-CM

## 2012-06-11 DIAGNOSIS — R432 Parageusia: Secondary | ICD-10-CM

## 2012-06-11 DIAGNOSIS — C8590 Non-Hodgkin lymphoma, unspecified, unspecified site: Secondary | ICD-10-CM

## 2012-06-11 DIAGNOSIS — C8589 Other specified types of non-Hodgkin lymphoma, extranodal and solid organ sites: Secondary | ICD-10-CM

## 2012-06-11 LAB — COMPREHENSIVE METABOLIC PANEL
ALT: 20 U/L (ref 0–35)
AST: 18 U/L (ref 0–37)
Albumin: 4.3 g/dL (ref 3.5–5.2)
Alkaline Phosphatase: 69 U/L (ref 39–117)
Glucose, Bld: 98 mg/dL (ref 70–99)
Potassium: 3.6 mEq/L (ref 3.5–5.3)
Sodium: 144 mEq/L (ref 135–145)
Total Bilirubin: 0.8 mg/dL (ref 0.3–1.2)
Total Protein: 6.7 g/dL (ref 6.0–8.3)

## 2012-06-11 LAB — T4, FREE: Free T4: 0.98 ng/dL (ref 0.80–1.80)

## 2012-06-11 MED ORDER — CALCITRIOL 0.25 MCG PO CAPS: ORAL_CAPSULE | ORAL | Status: AC

## 2012-06-11 MED ORDER — CALCITRIOL 0.25 MCG PO CAPS: 0.2500 ug | ORAL_CAPSULE | Freq: Every day | ORAL | Status: DC

## 2012-06-11 NOTE — Patient Instructions (Addendum)
Follow up visit in 6 months. Please take all medications. If you have questions, please call.

## 2012-06-11 NOTE — Progress Notes (Signed)
Subjective:  Patient Name: Joy Daniels Date of Birth: 03/23/1941  MRN: [id number]  Keyra Virella  presents to the office today for follow-up of her secondary hyperparathyroidism, hypocalcemia, vitamin D deficiency, iron deficiency, dysgeusia, dyspepsia, hypothyroidism, thyroiditis, nontoxic multinodular goiter, and mantle cell lymphoma.  HISTORY OF PRESENT ILLNESS:   Karissa is a 71 y.o. Caucasian woman.  Oasis was unaccompanied.   1. The patient was first referred to me on 02/01/05 by her primary care physician, Dr. Samuel Germany, for follow up of toxic nodular goiter. The patient was then 71 years of age.   A.  The patient had been previously evaluated by Dr. Lillia Mountain, an adult endocrinologist based in [place]. Dr. Cheryll Cockayne was performing an outreach clinic at the [facility] in [place], [place]. He evaluated the patient for hyperthyroidism and found that she had a toxic nodular goiter. He arranged for her to have radioactive iodine treatment on 12/13/04 with 24.5 mCi of I-131. When Dr. Cheryll Cockayne retired, I was asked to handle the outreach clinic for the next several months. At the patient's first visit with me, many of her hyperthyroid symptoms and signs had significantly improved. Past medical history was positive for hypertension, osteoporosis, hip pains, dyspepsia, and GERD. She had a previous injury to her right hand that required surgical repair. Family history was positive for cancer in one sister and a heart attack in another sister. Her brother had diabetes and had a stroke. On physical examination she was hypertensive. She had a 25+ gram goiter. She also had pallor of the nailbeds of her fingers.   B. At her next follow up visit in [place] on 03/29/05, she felt much better. Her energy was much improved. Muscle strength was improving. She was still significantly bothered by her excessive belly hunger that was causing weight gain. Recent TFTs from 03/22/05 were normal, with a TSH of 1.11, a free T4 of  1.22, and a free T3 of 2.8. I started her on Nexium 40 mg per day. Since I was getting ready to terminate the outreach clinic in [place], I made arrangements for her to be followed at my clinic in [place].  2. During the last 7 years that I have followed her here in [place], we dealt with several different problem areas:  A. I followed two aspects of her multinodular goiter, the nodules themselves and her thyroid hormone status.   1. A thyroid ultrasound performed on 10/26/06 showed a 1.5 cm dominant hyperechoic mass in the left upper pole. There were 2 smaller nodules in the right lobe measuring 6.9 and 7.6 mm in diameter. On 12/14/06 a fine needle aspirate was performed on the dominant nodule in the left upper lobe. The pathologists diagnosis was: "Rare benign follicular cells, abundant histiocytes and colloid. No neoplasms identified." A follow up thyroid ultrasound performed on 11/05/07 showed an echogenic nodule in the mid left lobe measuring 1.7 x 1.1 x 0.8 cm. This was not grossly changed from images obtained during the previous fine needle aspirate. There was also a smaller nodule in the left lobe measuring 1.3 x 0.6 cm.  In the inferior aspect of the right lobe there was a nodule measuring 1.1 x 0.7 x 0.5. Her next thyroid ultrasound performed on 01/14/2009 showed a partially calcified nodule in the lower pole of the right lobe measuring 1.8 x 0.6 x 0.8. By report, this nodule had increased somewhat in size. However, because the images from the 2009 study were not available, no direct comparison could be made. The  largest nodule in the left lobe measured 1.6 x 1.0 x 0.8., essentially unchanged from the previous study.   2. The patient remained euthyroid through the spring of 2010. In September of 2010 and again in April of 2011 she became mildly hypothyroid. It appeared that she was having some flare ups of Hashimoto's disease at those times. Her TFTs subsequently normalized on 11/26/10.  B. Because  of her known osteoporosis, I wanted to treat her with a bisphosphonate. Since she had previously not tolerated Actonel well, she was reluctant. I finally convinced her to try Boniva, but she later discontinued that medication as well. She was not at all interested in doing Forteo injections. To further try to minimize bone loss, I monitored her bone mineral parameters closely. Up through her visit on 04/17/07 her parameters were normal, but she had many difficulties in taking all of her calcium and vitamin D pills, in part due to constipation. Unfortunately, by 12/03/07 her 25-hydroxy vitamin D dropped to 24.2 and her 1,25- dihydroxy vitamin D dropped to 23.3. It appeared not only was she deficient in vitamin D intake, but her kidneys were also not optimally converting 25-hydroxy vitamin D to 1, 25-dihydroxy hydroxy vitamin D. I prescribed vitamin D, 50,000 IU per week. I also added calcitriol, 0.25 mcg per week. When she took these medications, her bone parameters normalized. In early 2010, however, she had stopped taking these medications. Her PTH rose to 75 and then to 81.8. When she again took her calcium and her two vitamin D preparations, her PTH decreased progressively to 62.1 and then to 33 on 11/26/10. On that date, her serum calcium was 10.2, her 25-hydroxy vitamin D was 32.2, and her 1, 25- dihydroxy vitamin D was 28.2. By then, however, she had stopped taking the vitamin D 50,000 international unit weekly because her insurance company would not cover it.  C. 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 She began chemotherapy on 07/14/10. She is receiving her cancer treatments at the [facility] She will receive chemotherapy every other month for two years. She just had her 9th course of chemotherapy. She will have an additional 3 courses. The cancer has been in remission. She will have a follow up CT scan in November just  prior to her next course of chemotherapy.  3. The patient's last [facility] visit was on 11/17/11. In the interim, she has had more COPD symptoms, some URI symptoms, some nausea, vomiting, and diarrhea. She saw a urologist for her urinary incontinence. He gave her a new pill which helps. She still feels tired and weak, but is better. She has yet to regain her strength. She ran out of calcitriol. She has vitamin D, but has not been taking it. She has her nebulizer and inhalers, but just doesn't use them.  4. Pertinent Review of Systems:  Constitutional: The patient feels tired a lot. She gets short of breath with exertion due to her COPD.  Her appetite is still poor.  Eyes: Vision is good with her glasses. Her eyes are dry a lot. Eyelids sometimes seem to stick to the eyes. She knows that she should use eye drops, but she is not bothered enough to use them.  Mouth: Mouth is also quite dry, but is better. Neck: She has some left lateral mid-neck "cramps". She has no complaints of difficulty swallowing.  Heart: Heart rate increases with exercise or other physical activity. The  patient has no complaints of palpitations, irregular heat beats, chest pain, or chest pressure. Lungs: Her COPD is still a major issue. She gets short of breath even with minor exertion. S noted above, she is not using her nebulizer or MDIs. Gastrointestinal: As above. She has occasional constipation. The patient has no complaints of excessive hunger, acid reflux, upset stomach, stomach aches or pains, or constipation. Legs: Muscle mass and strength seem normal. There are no complaints of numbness, tingling, or burning. She has some edema, but much less.   Feet: There are no obvious foot problems. There are no complaints of numbness, tingling, burning, or pain. No edema is noted. Neuro: Her balance is not so good. She should use her cane, but doesn't.   PAST MEDICAL, FAMILY, AND SOCIAL HISTORY:  Past Medical History  Diagnosis Date  .  Toxic nodular goiter   . Other osteoporosis   . GERD (gastroesophageal reflux disease)   . Iron deficiency   . Pallor   . Combined hyperlipidemia   . Hypercalcemia   . Nontoxic multinodular goiter   . Vitamin D deficiency disease   . Hyperparathyroidism , secondary, non-renal   . Thyroiditis, autoimmune   . Hypothyroidism, acquired, autoimmune   . COPD (chronic obstructive pulmonary disease)   . Dysgeusia   . Lymphoma, mantle cell, multiple sites   . Hypertension   . Obesity (BMI 30.0-34.9)     Family History  Problem Relation Age of Onset  . Diabetes Father   . Cancer Sister   . Heart disease Sister     Current outpatient prescriptions:calcitRIOL (ROCALTROL) 0.25 MCG capsule, TAKE 1 CAPSULE WEEKLY, Disp: 12 capsule, Rfl: 6;  furosemide (LASIX) 20 MG tablet, Take 20 mg by mouth 2 (two) times daily.  , Disp: , Rfl: ;  metoprolol (LOPRESSOR) 50 MG tablet, Take 50 mg by mouth daily.  , Disp: , Rfl: ;  omeprazole (PRILOSEC) 10 MG capsule, Take 10 mg by mouth daily.  , Disp: , Rfl:  sertraline (ZOLOFT) 50 MG tablet, Take 50 mg by mouth daily.  , Disp: , Rfl:   Allergies as of 06/11/2012 - Review Complete 06/11/2012  Allergen Reaction Noted  . Codeine  01/27/2011    1. Work and Family: Her family is supportive. 2. Activities: She "sits a lot". She does some house work. 3. Smoking, alcohol, or drugs: Unknown 4. Primary Care Provider: Dr. Coralee North B. Uppin  ROS: There are no other significant problems involving Taylen's other body systems.   Objective:  Vital Signs:  BP 134/63  Pulse 87  Wt 175 lb 6.4 oz (79.561 kg)   Ht Readings from Last 3 Encounters:  10/12/10 5' (1.524 m)  02/11/10 5' (1.524 m)   Wt Readings from Last 3 Encounters:  06/11/12 175 lb 6.4 oz (79.561 kg)  11/17/11 180 lb (81.647 kg)  05/18/11 173 lb (78.472 kg)   PHYSICAL EXAM:  Constitutional: The patient really looks tired today.   Face: The face appears normal.  Eyes: There is no obvious arcus or  proptosis. Moisture appears normal. Mouth: The oropharynx and tongue appear normal. Oral moisture is normal. Neck: The neck appears to be visibly normal. No carotid bruits are noted. The thyroid gland is low-lying. The thyroid gland is 18-20 grams in size. The consistency of the thyroid gland is normal. The thyroid gland is not tender to palpation. Lungs: The lungs are clear to auscultation. Air movement is good. Heart: Heart rate and rhythm are regular. Heart sounds S1 and  S2 are normal. I did not appreciate any pathologic cardiac murmurs. Abdomen: The abdomen is enlarged. Bowel sounds are normal. There is no obvious hepatomegaly, splenomegaly, or other mass effect.  Arms: Muscle size and bulk are normal for age. Hands: There is no obvious tremor. Phalangeal and metacarpophalangeal joints are normal. Palmar muscles are normal. Palmar skin is normal. Palmar moisture is also normal. Legs: Muscles appear normal for age. She has trace leg edema bilaterally.  Feet: DP pulses are 1+ bilaterally. Neurologic: Strength is normal for age in both the upper and lower extremities. Muscle tone is normal. Sensation to touch is normal in both legs.  Shins are painful to deeper palpation.  LAB DATA:  11/17/11: PTH 70.9,calcium 10.0, 25-hydroxy vitamin D 40, 1,25-dihydroxy vitamin D 67, TSH 2.312. Free T4  0.97, Free T3 2.4  05/19/11: PTH 64, calcium 10.4, 25-hydroxy vitamin D 36, 1,25-dihydroxy vitamin D 47, TSH 2.497, free T4 1.04, free T3 2.6   Assessment and Plan:   ASSESSMENT:  1. Obesity: The patient has lost 5 pounds in 6 months, partly due to food not tasting well and partly due to recent episodes of stomach flu and/or food poisoning.  2. Vitamin D deficiency: Patient was doing well in March and the prior September. Since she stopped taking vitamin D, I expect that her vitamin D level will be lower now.  3. Thyroiditis: Her Hashimoto's disease is clinically quiescent today. She was again euthyroid in  March. Due to the mantle radiation, she is at risk for developing hypothyroidism due to the XRT itself.  4. Dyspepsia: She is doing better. 5. Hyperparathyroidism, secondary to calcium and vitamin D intake deficiency: Her PTH was more elevated in March, c/w somewhat lower calcium and vitamin D intake.   6. Dysgeusia: She still has some problems, but is doing much better. Vitamin B6 will help, if she takes it. Unfortunately, she has not been willing to do so.  7. Goiter: Her thyroid gland is again within normal for size  today. The waxing and waning of the thyroid gland size in the past is another indicator of Hashimoto's disease. 8. Fatigue: Patient is still fairly tired. There are many potential reasons for this problem.  9. Lymphoma: Her lymphoma is in remission. 10. URI: She appears to have a new URI. Her lungs sound amazingly good.   PLAN:  1. Diagnostic: TFTs, PTH, calcium, both vitamin D's 2. Therapeutic: Please take all of her medications, to include her calcitriol, calcium, and vitamin D. Take a multivitamin pill daily. 3. Patient education: Since her lymphoma is in remission, it is important to protect her bones the best way we can. We need to keep up her intake of calcium and vitamin D. 4. Follow-up: 6 months  Level of Service: This visit lasted in excess of 40 minutes. More than 50% of the visit was devoted to counseling.  David Stall, MD 06/11/2012 10:07 AM

## 2012-06-12 LAB — VITAMIN D 25 HYDROXY (VIT D DEFICIENCY, FRACTURES): Vit D, 25-Hydroxy: 29 ng/mL — ABNORMAL LOW (ref 30–89)

## 2012-06-12 LAB — PTH, INTACT AND CALCIUM
Calcium, Total (PTH): 9.9 mg/dL (ref 8.4–10.5)
PTH: 102.3 pg/mL — ABNORMAL HIGH (ref 14.0–72.0)

## 2012-06-15 LAB — VITAMIN D 1,25 DIHYDROXY
Vitamin D2 1, 25 (OH)2: 14 pg/mL
Vitamin D3 1, 25 (OH)2: 43 pg/mL

## 2012-12-10 ENCOUNTER — Encounter: Payer: Self-pay | Admitting: "Endocrinology

## 2012-12-10 ENCOUNTER — Ambulatory Visit (INDEPENDENT_AMBULATORY_CARE_PROVIDER_SITE_OTHER): Payer: Medicare PPO | Admitting: "Endocrinology

## 2012-12-10 VITALS — BP 125/80 | HR 92 | Wt 176.3 lb

## 2012-12-10 DIAGNOSIS — E049 Nontoxic goiter, unspecified: Secondary | ICD-10-CM

## 2012-12-10 DIAGNOSIS — R5381 Other malaise: Secondary | ICD-10-CM

## 2012-12-10 DIAGNOSIS — R432 Parageusia: Secondary | ICD-10-CM

## 2012-12-10 DIAGNOSIS — R5383 Other fatigue: Secondary | ICD-10-CM

## 2012-12-10 DIAGNOSIS — R1013 Epigastric pain: Secondary | ICD-10-CM

## 2012-12-10 DIAGNOSIS — E559 Vitamin D deficiency, unspecified: Secondary | ICD-10-CM

## 2012-12-10 DIAGNOSIS — E063 Autoimmune thyroiditis: Secondary | ICD-10-CM

## 2012-12-10 DIAGNOSIS — E669 Obesity, unspecified: Secondary | ICD-10-CM

## 2012-12-10 DIAGNOSIS — C8310 Mantle cell lymphoma, unspecified site: Secondary | ICD-10-CM

## 2012-12-10 DIAGNOSIS — C8319 Mantle cell lymphoma, extranodal and solid organ sites: Secondary | ICD-10-CM

## 2012-12-10 DIAGNOSIS — R439 Unspecified disturbances of smell and taste: Secondary | ICD-10-CM

## 2012-12-10 DIAGNOSIS — E211 Secondary hyperparathyroidism, not elsewhere classified: Secondary | ICD-10-CM

## 2012-12-10 LAB — COMPREHENSIVE METABOLIC PANEL
ALT: 28 U/L (ref 0–35)
AST: 28 U/L (ref 0–37)
Albumin: 3.9 g/dL (ref 3.5–5.2)
CO2: 29 mEq/L (ref 19–32)
Calcium: 9.9 mg/dL (ref 8.4–10.5)
Chloride: 102 mEq/L (ref 96–112)
Potassium: 3.6 mEq/L (ref 3.5–5.3)
Total Protein: 6.4 g/dL (ref 6.0–8.3)

## 2012-12-10 LAB — CBC WITH DIFFERENTIAL/PLATELET
Basophils Absolute: 0 10*3/uL (ref 0.0–0.1)
Basophils Relative: 1 % (ref 0–1)
Eosinophils Absolute: 0.2 10*3/uL (ref 0.0–0.7)
Hemoglobin: 11.1 g/dL — ABNORMAL LOW (ref 12.0–15.0)
MCH: 31.1 pg (ref 26.0–34.0)
MCHC: 33 g/dL (ref 30.0–36.0)
Monocytes Absolute: 0.8 10*3/uL (ref 0.1–1.0)
Monocytes Relative: 14 % — ABNORMAL HIGH (ref 3–12)
Neutrophils Relative %: 54 % (ref 43–77)
RDW: 15.7 % — ABNORMAL HIGH (ref 11.5–15.5)

## 2012-12-10 NOTE — Patient Instructions (Signed)
Follow up visit in 6 months. Please have lab tests done 2 weeks early.

## 2012-12-10 NOTE — Progress Notes (Signed)
Subjective:  Patient Name: Joy Daniels Date of Birth: 05/20/1941  MRN: [id number]  Joy Daniels  presents to the office today for follow-up of her secondary hyperparathyroidism, hypocalcemia, vitamin D deficiency, iron deficiency, dysgeusia, dyspepsia, hypothyroidism, thyroiditis, nontoxic multinodular goiter, and mantle cell lymphoma.  HISTORY OF PRESENT ILLNESS:   Joy Daniels is a 72 y.o. Caucasian woman.  Joy Daniels was unaccompanied.   1. The patient was first referred to me on 02/01/05 by her primary care physician, Dr. Samuel Germany, for follow up of toxic nodular goiter. The patient was then 72 years of age.   A.  The patient had been previously evaluated by Dr. Lillia Mountain, an adult endocrinologist based in [place]. Dr. Cheryll Cockayne was performing an outreach clinic at the [facility] in [place], [place]. He evaluated the patient for hyperthyroidism and found that she had a toxic nodular goiter. He arranged for her to have radioactive iodine treatment on 12/13/04 with 24.5 mCi of I-131. When Dr. Cheryll Cockayne retired, I was asked to handle the outreach clinic for the next several months. At the patient's first visit with me, many of her hyperthyroid symptoms and signs had significantly improved. Past medical history was positive for hypertension, osteoporosis, hip pains, dyspepsia, and GERD. She had a previous injury to her right hand that required surgical repair. Family history was positive for cancer in one sister and a heart attack in another sister. Her brother had diabetes and had a stroke. On physical examination she was hypertensive. She had a 25+ gram goiter. She also had pallor of the nailbeds of her fingers.   B. At her next follow up visit in [place] on 03/29/05, she felt much better. Her energy was much improved. Muscle strength was improving. She was still significantly bothered by her excessive belly hunger that was causing weight gain. TFTs from 03/22/05 were normal, with a TSH of 1.11, a free T4 of 1.22,  and a free T3 of 2.8. I started her on Nexium 40 mg per day. Since I was getting ready to terminate the outreach clinic in [place], I made arrangements for her to be followed at my clinic in [place].  2. During the last 7 years that I have followed her here in [place], we have dealt with several different problem areas:  A. I followed two aspects of her multinodular goiter, the nodules themselves and her thyroid hormone status.   1. A thyroid ultrasound performed on 10/26/06 showed a 1.5 cm dominant hyperechoic mass in the left upper pole. There were 2 smaller nodules in the right lobe measuring 6.9 and 7.6 mm in diameter. On 12/14/06 a fine needle aspirate was performed on the dominant nodule in the left upper lobe. The pathologists diagnosis was: "Rare benign follicular cells, abundant histiocytes and colloid. No neoplasms identified." A follow up thyroid ultrasound performed on 11/05/07 showed an echogenic nodule in the mid left lobe measuring 1.7 x 1.1 x 0.8 cm. This was not grossly changed from images obtained during the previous fine needle aspirate. There was also a smaller nodule in the left lobe measuring 1.3 x 0.6 cm.  In the inferior aspect of the right lobe there was a nodule measuring 1.1 x 0.7 x 0.5. Her next thyroid ultrasound performed on 01/14/2009 showed a partially calcified nodule in the lower pole of the right lobe measuring 1.8 x 0.6 x 0.8. By report, this nodule had increased somewhat in size. However, because the images from the 2009 study were not available, no direct comparison could be made. The  largest nodule in the left lobe measured 1.6 x 1.0 x 0.8., essentially unchanged from the previous study.   2. The patient remained euthyroid through the spring of 2010. In September of 2010 and again in April of 2011 she became mildly hypothyroid. It appeared that she was having some flare ups of Hashimoto's disease at those times. Her TFTs subsequently normalized on 11/26/10.  B. Because  of her known osteoporosis, I wanted to treat her with a bisphosphonate. Since she had previously not tolerated Actonel well, she was reluctant. I finally convinced her to try Boniva, but she later discontinued that medication as well. She was not at all interested in doing Forteo injections. To further try to minimize bone loss, I monitored her bone mineral parameters closely. Up through her visit on 04/17/07 her parameters were normal, but she had many difficulties in taking all of her calcium and vitamin D pills, in part due to constipation. Unfortunately, by 12/03/07 her 25-hydroxy vitamin D dropped to 24.2 and her 1,25- dihydroxy vitamin D dropped to 23.3. It appeared not only was she deficient in vitamin D intake, but her kidneys were also not optimally converting 25-hydroxy vitamin D to 1, 25-dihydroxy hydroxy vitamin D. I prescribed vitamin D, 50,000 IU per week. I also added calcitriol, 0.25 mcg per week. When she took these medications, her bone parameters normalized. In early 2010, however, she had stopped taking these medications. Her PTH rose to 75 and then to 81.8. When she again took her calcium and her two vitamin D preparations, her PTH decreased progressively to 62.1 and then to 33 on 11/26/10. On that date, her serum calcium was 10.2, her 25-hydroxy vitamin D was 32.2, and her 1, 25- dihydroxy vitamin D was 28.2. By then, however, she had stopped taking the vitamin D 50,000 international unit weekly because her insurance company would not cover it.  3. The patient's last [facility] visit was on 06/11/12. In the interim, she completed chemotherapy for her stage IV mantle cell lymphoma on 11/30/12. She has no signs of recurrent lymphoma at this time. She has not had many COPD symptoms. She has not needed to use either her inhaler or her nebulizer. Since stopping dairy products, she has not had any further nausea, vomiting, and diarrhea. She is on a new medication for urinary incontinence. She feels somewhat  less tired and weak. She is taking her calcitriol, calcium, and vitamin D.  4. Pertinent Review of Systems:  Constitutional: The patient feels tired a lot. She gets short of breath with exertion due to her COPD.  Her appetite is still better and she has regained 5 pounds.   Eyes: Vision is not as good in her left eye. Her last eye exam was more than one year ago. Her eyes are dry a lot, so she is now using her eye drops regularly.  Mouth: Mouth is not as dry, in part due to changing medications for her urinary incontinence.  Neck: She still has some left anterolateral "cramps", but much less frequently. Cepacol mouthwash helps. She occasionally has difficulty swallowing meats or hard breads.   Heart: Heart rate increases with exercise or other physical activity. The patient has no complaints of palpitations, irregular heat beats, chest pain, or chest pressure. Lungs: Her COPD is better since having chemotherapy. She gets short of breath even with minor exertion. Gastrointestinal: Her stomach pains and nausea have resolved. She has occasional constipation. The patient has no complaints of excessive hunger, acid reflux, upset stomach, stomach  aches or pains, or constipation. Legs: Muscle mass and strength seem normal. There are no complaints of numbness, tingling, or burning. She has more edema, but sometimes does not take her Lasix so that she will not have to urinate so often.  She often has restless legs at night.  Feet: There are no obvious foot problems. There are no complaints of numbness, tingling, burning, or pain. No edema is noted. Neuro: Her balance is not so good. She should use her cane, but doesn't.   PAST MEDICAL, FAMILY, AND SOCIAL HISTORY:  Past Medical History  Diagnosis Date  . Toxic nodular goiter   . Other osteoporosis   . GERD (gastroesophageal reflux disease)   . Iron deficiency   . Pallor   . Combined hyperlipidemia   . Hypercalcemia   . Nontoxic multinodular goiter   .  Vitamin D deficiency disease   . Hyperparathyroidism , secondary, non-renal   . Thyroiditis, autoimmune   . Hypothyroidism, acquired, autoimmune   . COPD (chronic obstructive pulmonary disease)   . Dysgeusia   . Lymphoma, mantle cell, multiple sites   . Hypertension   . Obesity (BMI 30.0-34.9)     Family History  Problem Relation Age of Onset  . Diabetes Father   . Cancer Sister   . Heart disease Sister     Current outpatient prescriptions:calcitRIOL (ROCALTROL) 0.25 MCG capsule, Take one capsule (0.25 mcg) per week., Disp: 12 capsule, Rfl: 3;  furosemide (LASIX) 20 MG tablet, Take 20 mg by mouth 2 (two) times daily.  , Disp: , Rfl: ;  metoprolol (LOPRESSOR) 50 MG tablet, Take 50 mg by mouth daily.  , Disp: , Rfl: ;  omeprazole (PRILOSEC) 10 MG capsule, Take 10 mg by mouth daily.  , Disp: , Rfl:  sertraline (ZOLOFT) 50 MG tablet, Take 50 mg by mouth daily.  , Disp: , Rfl:   Allergies as of 12/10/2012 - Review Complete 12/10/2012  Allergen Reaction Noted  . Codeine  01/27/2011    1. Work and Family: Her family is supportive. 2. Activities: She now feels as if she will be able to do more physically. She does some house work. 3. Smoking, alcohol, or drugs: Unknown 4. Primary Care Provider: Dr. Coralee North B. Uppin  REVIEW OF SYSTEMS: There are no other significant problems involving Joy Daniels's other body systems.   Objective:  Vital Signs:  BP 125/80  Pulse 92  Wt 176 lb 4.8 oz (79.969 kg)  BMI 34.43 kg/m2   Ht Readings from Last 3 Encounters:  10/12/10 5' (1.524 m)  02/11/10 5' (1.524 m)   Wt Readings from Last 3 Encounters:  12/10/12 176 lb 4.8 oz (79.969 kg)  06/11/12 175 lb 6.4 oz (79.561 kg)  11/17/11 180 lb (81.647 kg)   PHYSICAL EXAM:  Constitutional: The patient really looks pretty good today.  She is perky and smiling.  Face: The face appears normal.  Eyes: There is no obvious arcus or proptosis. Moisture appears normal. Mouth: The oropharynx and tongue appear  normal. Oral moisture is normal. Neck: The neck appears to be visibly normal. No carotid bruits are noted. The thyroid gland is low-lying. The thyroid gland is 18-20 grams in size. The consistency of the thyroid gland is normal. The thyroid gland is not tender to palpation. Lungs: The lungs are clear to auscultation. Air movement is good. Heart: Heart rate and rhythm are regular. Heart sounds S1 and S2 are normal. I did not appreciate any pathologic cardiac murmurs. Abdomen: The abdomen is  enlarged. Bowel sounds are normal. There is no obvious hepatomegaly, splenomegaly, or other mass effect.  Arms: Muscle size and bulk are normal for age. Hands: There is no obvious tremor. Phalangeal and metacarpophalangeal joints are normal. Palmar muscles are normal. Palmar skin is normal. Palmar moisture is also normal. Legs: Muscles appear normal for age. She has 1+ leg edema bilaterally. In retrospect, she did not take Lasix yesterday or today. Neurologic: Strength is normal for age in both the upper and lower extremities. Muscle tone is normal. Sensation to touch is normal in both legs.  Shins are painful to deeper palpation.  LAB DATA:  06/11/12: CMP was normal, to include a calcium of 9.9; TSH 2.195,  free T4 0.98, free T3 2.6; PTH 102.3, 25-hydroxy vitamin D 29, 1,25-dihydroxy vitamin D 57. 11/17/11: PTH 70.9,calcium 10.0, 25-hydroxy vitamin D 40, 1,25-dihydroxy vitamin D 67, TSH 2.312. Free T4  0.97, Free T3 2.4 05/19/11: PTH 64, calcium 10.4, 25-hydroxy vitamin D 36, 1,25-dihydroxy vitamin D 47, TSH 2.497, free T4 1.04, free T3 2.6   Assessment and Plan:   ASSESSMENT:  1. Obesity: The patient has regained a few pounds that she lost. Once she can resume exercising, that will help to control weight.  2. Vitamin D deficiency: Patient was doing well in March 2013, but not so well last September. Since she has now resumed taking the vitamin D and calcitriol, her numbers should be better.  3. Thyroiditis: Her  Hashimoto's disease is clinically quiescent today. She was euthyroid in March and in September 2013.   4. Dyspepsia: She is doing better on omeprazole. 5. Hyperparathyroidism, secondary to calcium and vitamin D intake deficiency: Her PTH was more elevated in September, c/w lower calcium and vitamin D intake.   6. Dysgeusia: Her dysgeusia has improved after resuming her Centrum Silver.  7. Goiter: Her thyroid gland is again within normal for size  today. The waxing and waning of the thyroid gland size in the past is another indicator of Hashimoto's disease. 8. Fatigue: Patient's fatigue has improved slightly.   9. Lymphoma: Her lymphoma is in remission. She really looks so much better today.   PLAN:  1. Diagnostic: TFTs, PTH, calcium, 25-hydroxy vitamin D today. Please repeat tests, to include a 1,25-dihydroxy vitamin D test 2 weeks prior to next  Visit.  2. Therapeutic: Please take all of her medications, to include her calcitriol, calcium, and vitamin D. Take a multivitamin pill daily. 3. Patient education: Since her lymphoma is in remission, it is important to protect her bones the best way we can. We need to keep up her intake of calcium and vitamin D. 4. Follow-up: 6 months  Level of Service: This visit lasted in excess of 60 minutes. More than 50% of the visit was devoted to counseling.  David Stall, MD 12/10/2012 10:11 AM

## 2012-12-11 LAB — PTH, INTACT AND CALCIUM
Calcium, Total (PTH): 9.9 mg/dL (ref 8.4–10.5)
PTH: 89.4 pg/mL — ABNORMAL HIGH (ref 14.0–72.0)

## 2013-05-09 ENCOUNTER — Other Ambulatory Visit: Payer: Self-pay | Admitting: *Deleted

## 2013-05-09 DIAGNOSIS — E038 Other specified hypothyroidism: Secondary | ICD-10-CM

## 2013-06-11 ENCOUNTER — Ambulatory Visit (INDEPENDENT_AMBULATORY_CARE_PROVIDER_SITE_OTHER): Payer: Medicare PPO | Admitting: "Endocrinology

## 2013-06-11 ENCOUNTER — Encounter: Payer: Self-pay | Admitting: "Endocrinology

## 2013-06-11 VITALS — BP 130/80 | HR 75 | Wt 174.0 lb

## 2013-06-11 DIAGNOSIS — E559 Vitamin D deficiency, unspecified: Secondary | ICD-10-CM

## 2013-06-11 DIAGNOSIS — E049 Nontoxic goiter, unspecified: Secondary | ICD-10-CM

## 2013-06-11 DIAGNOSIS — R432 Parageusia: Secondary | ICD-10-CM

## 2013-06-11 DIAGNOSIS — K3189 Other diseases of stomach and duodenum: Secondary | ICD-10-CM

## 2013-06-11 DIAGNOSIS — R1013 Epigastric pain: Secondary | ICD-10-CM

## 2013-06-11 DIAGNOSIS — R5381 Other malaise: Secondary | ICD-10-CM

## 2013-06-11 DIAGNOSIS — C8310 Mantle cell lymphoma, unspecified site: Secondary | ICD-10-CM

## 2013-06-11 DIAGNOSIS — E063 Autoimmune thyroiditis: Secondary | ICD-10-CM

## 2013-06-11 DIAGNOSIS — C8319 Mantle cell lymphoma, extranodal and solid organ sites: Secondary | ICD-10-CM

## 2013-06-11 DIAGNOSIS — R439 Unspecified disturbances of smell and taste: Secondary | ICD-10-CM

## 2013-06-11 DIAGNOSIS — E211 Secondary hyperparathyroidism, not elsewhere classified: Secondary | ICD-10-CM

## 2013-06-11 LAB — T4, FREE: Free T4: 1.06 ng/dL (ref 0.80–1.80)

## 2013-06-11 LAB — TSH: TSH: 2.901 u[IU]/mL (ref 0.350–4.500)

## 2013-06-11 NOTE — Patient Instructions (Signed)
Follow up visit in 6 months. Please take one calcium pill with vitamin D twice daily with meals. Please continue to take other medications as planned.

## 2013-06-11 NOTE — Progress Notes (Signed)
Subjective:  Patient Name: Joy Daniels Date of Birth: [birth year]  MRN: [id number]  Joy Daniels  presents to the office today for follow-up of her secondary hyperparathyroidism, hypocalcemia, vitamin D deficiency, iron deficiency, dysgeusia, dyspepsia, hypothyroidism, thyroiditis, nontoxic multinodular goiter, and mantle cell lymphoma.  HISTORY OF PRESENT ILLNESS:   Joy Daniels is a 72 y.o. Caucasian woman.  Joy Daniels was unaccompanied.   1. The patient was first referred to me on 02/01/05 by her primary care physician, Dr. Samuel Germany, for follow up of toxic nodular goiter. The patient was then 72 years of age.   A.  The patient had been previously evaluated by Dr. Lillia Mountain, an adult endocrinologist based in [place]. Dr. Cheryll Cockayne was performing an outreach clinic at the [facility] in [place], [place]. He evaluated the patient for hyperthyroidism and found that she had a toxic nodular goiter. He arranged for her to have radioactive iodine treatment on 12/13/04 with 24.5 mCi of I-131. When Dr. Cheryll Cockayne retired, I was asked to handle the outreach clinic for the next several months. At the patient's first visit with me, many of her hyperthyroid symptoms and signs had significantly improved. Past medical history was positive for hypertension, osteoporosis, hip pains, dyspepsia, and GERD. She had a previous injury to her right hand that required surgical repair. Family history was positive for cancer in one sister and a heart attack in another sister. Her brother had diabetes and had a stroke. On physical examination she was hypertensive. She had a 25+ gram goiter. She also had pallor of the nailbeds of her fingers.   B. At her next follow up visit in [place] on 03/29/05, she felt much better. Her energy was much improved. Muscle strength was improving. She was still significantly bothered by her excessive belly hunger that was causing weight gain. TFTs from 03/22/05 were normal, with a TSH of 1.11, a free T4 of 1.22,  and a free T3 of 2.8. I started her on Nexium 40 mg per day. Since I was getting ready to terminate the outreach clinic in [place], I made arrangements for her to be followed at my clinic in [place].   2. During the last 8 years that I have followed her here in [place], we have dealt with several different problem areas:  A. I followed two aspects of her multinodular goiter, the nodules themselves and her thyroid hormone status.   1. A thyroid ultrasound performed on 10/26/06 showed a 1.5 cm dominant hyperechoic mass in the left upper pole. There were 2 smaller nodules in the right lobe measuring 6.9 and 7.6 mm in diameter. On 12/14/06 a fine needle aspirate was performed on the dominant nodule in the left upper lobe. The pathologists diagnosis was: "Rare benign follicular cells, abundant histiocytes and colloid. No neoplasms identified." A follow up thyroid ultrasound performed on 11/05/07 showed an echogenic nodule in the mid left lobe measuring 1.7 x 1.1 x 0.8 cm. This was not grossly changed from images obtained during the previous fine needle aspirate. There was also a smaller nodule in the left lobe measuring 1.3 x 0.6 cm.  In the inferior aspect of the right lobe there was a nodule measuring 1.1 x 0.7 x 0.5. Her next thyroid ultrasound performed on 01/14/2009 showed a partially calcified nodule in the lower pole of the right lobe measuring 1.8 x 0.6 x 0.8. By report, this nodule had increased somewhat in size. However, because the images from the 2009 study were not available, no direct comparison could be made.  The largest nodule in the left lobe measured 1.6 x 1.0 x 0.8., essentially unchanged from the previous study.   2. The patient remained euthyroid through the spring of 2010. In September of 2010 and again in April of 2011 she became mildly hypothyroid. It appeared that she was having some flare ups of Hashimoto's disease at those times. Her TFTs subsequently normalized on 11/26/10.  B. Because  of her known osteoporosis, I wanted to treat her with a bisphosphonate. Since she had previously not tolerated Actonel well, she was reluctant. I finally convinced her to try Boniva, but she later discontinued that medication as well. She was not at all interested in doing Forteo injections. To further try to minimize bone loss, I monitored her bone mineral parameters closely. Up through her visit on 04/17/07 her parameters were normal, but she had many difficulties in taking all of her calcium and vitamin D pills, in part due to constipation. Unfortunately, by 12/03/07 her 25-hydroxy vitamin D dropped to 24.2 and her 1,25- dihydroxy vitamin D dropped to 23.3. It appeared not only was she deficient in vitamin D intake, but her kidneys were also not optimally converting 25-hydroxy vitamin D to 1, 25-dihydroxy hydroxy vitamin D. I prescribed vitamin D, 50,000 IU per week. I also added calcitriol, 0.25 mcg per week. When she took these medications, her bone parameters normalized. In early 2010, however, she had stopped taking these medications. Her PTH rose to 75 and then to 81.8. When she again took her calcium and her two vitamin D preparations, her PTH decreased progressively to 62.1 and then to 33 on 11/26/10. On that date, her serum calcium was 10.2, her 25-hydroxy vitamin D was 32.2, and her 1, 25- dihydroxy vitamin D was 28.2. By then, however, she had stopped taking the vitamin D 50,000 international unit weekly because her insurance company would not cover it.   3. The patient's last [facility] visit was on 12/10/12. In the interim, she shows no signs of recurrence of her mantle cell lymphoma. Her energy and iron are low. She has been otherwise healthy. She has not had many COPD symptoms. She has not needed to use either her inhaler or her nebulizer. Since stopping dairy products, she has not had any further nausea, vomiting, and diarrhea. She continues to have almost constant urinary incontinence despite being on  medication. She is taking her calcitriol, calcium, and vitamin D.   4. Pertinent Review of Systems:  Constitutional: The patient feels tired a lot. She gets short of breath with exertion due to her COPD.  Her appetite is "ferocious".    Eyes: Vision is not as good in her left eye. She will have an eye exam and cataract surgery on 06/19/12. Her eyes are dry a lot, but she is not using the drops very often due to concerns about possible side effects of the drops.   Mouth: Mouth is not dry like it was before.   Neck: She still has some left anterolateral "cramps", but much less frequently. The left side of her neck appears larger to her. She no longer has difficulty swallowing.   Heart: Heart rate increases with exercise or other physical activity. The patient has no complaints of palpitations, irregular heat beats, chest pain, or chest pressure. Lungs: Her COPD is better since completing chemotherapy. She still gets short of breath even with minor exertion.  Gastrointestinal: She still has frequent bloating after meals, even after small meals. Her stomach pains and nausea have resolved. She  has more frequent constipation. The patient has no complaints of excessive hunger, acid reflux, upset stomach, stomach aches or pains. Legs: She still has episodic calf cramps at night, 1-2 times per week.Muscle mass and strength seem normal. There are no complaints of numbness, tingling, or burning. She has frequent edema, but sometimes does not take her Lasix so that she will not have to urinate so often.  She often has restless legs at night.  Feet: There are no obvious foot problems. There are no complaints of numbness, tingling, burning, or pain. No edema is noted. Neuro: Her balance is not so good.    PAST MEDICAL, FAMILY, AND SOCIAL HISTORY:  Past Medical History  Diagnosis Date  . Toxic nodular goiter   . Other osteoporosis   . GERD (gastroesophageal reflux disease)   . Iron deficiency   . Pallor   .  Combined hyperlipidemia   . Hypercalcemia   . Nontoxic multinodular goiter   . Vitamin D deficiency disease   . Hyperparathyroidism , secondary, non-renal   . Thyroiditis, autoimmune   . Hypothyroidism, acquired, autoimmune   . COPD (chronic obstructive pulmonary disease)   . Dysgeusia   . Lymphoma, mantle cell, multiple sites   . Hypertension   . Obesity (BMI 30.0-34.9)     Family History  Problem Relation Age of Onset  . Diabetes Father   . Cancer Sister   . Heart disease Sister     Current outpatient prescriptions:calcitRIOL (ROCALTROL) 0.25 MCG capsule, Take one capsule (0.25 mcg) per week., Disp: 12 capsule, Rfl: 3;  metoprolol (LOPRESSOR) 50 MG tablet, Take 50 mg by mouth daily.  , Disp: , Rfl: ;  omeprazole (PRILOSEC) 10 MG capsule, Take 10 mg by mouth daily.  , Disp: , Rfl: ;  furosemide (LASIX) 20 MG tablet, Take 20 mg by mouth 2 (two) times daily.  , Disp: , Rfl:  sertraline (ZOLOFT) 50 MG tablet, Take 50 mg by mouth daily.  , Disp: , Rfl:   Allergies as of 06/11/2013 - Review Complete 06/11/2013  Allergen Reaction Noted  . Codeine  01/27/2011    1. Work and Family: Her family is supportive. 2. Activities: She does not do any regular physical activity, but tries to stay active with shopping and house work. 3. Smoking, alcohol, or drugs: Unknown 4. Primary Care Provider: Dr. Coralee North B. Uppin  REVIEW OF SYSTEMS: There are no other significant problems involving Nabilah's other body systems.   Objective:  Vital Signs:  BP 130/80  Pulse 75  Wt 174 lb (78.926 kg)  BMI 33.98 kg/m2   Ht Readings from Last 3 Encounters:  10/12/10 5' (1.524 m)  02/11/10 5' (1.524 m)   Wt Readings from Last 3 Encounters:  06/11/13 174 lb (78.926 kg)  12/10/12 176 lb 4.8 oz (79.969 kg)  06/11/12 175 lb 6.4 oz (79.561 kg)   PHYSICAL EXAM:  Constitutional: The patient really seems pretty good today. She is perky, smiling, and laughing. She has lost 2 pounds since last visit.  Face: The  face appears normal, but very lined and worn. She looks about 10 years older than her chronologic age.  Eyes: There is no obvious arcus or proptosis. Moisture appears normal. Mouth: The oropharynx and tongue appear normal. Oral moisture is normal. Neck: The neck appears to be visibly normal. No carotid bruits are noted. The thyroid gland is low-lying. The thyroid gland is about 20 grams in size. The right lobe is well within normal limits for size. The  left lobe is slightly enlarged today. The consistency of the thyroid gland is normal. The thyroid gland is not tender to palpation. The supraclavicular area appear normal. The left anterior side of the neck may be a bit more full than the right.  Lungs: The lungs are clear to auscultation. Air movement is good. Heart: Heart rate and rhythm are regular. Heart sounds S1 and S2 are normal. I did not appreciate any pathologic cardiac murmurs. Abdomen: The abdomen is enlarged. Bowel sounds are normal. There is no obvious hepatomegaly, splenomegaly, or other mass effect.  Arms: Muscle size and bulk are normal for age. Hands: There is no obvious tremor. Phalangeal and metacarpophalangeal joints are normal. Palmar muscles are normal. Palmar skin is normal. Palmar moisture is also normal. She has just a trace of pallor in her fingernails. Legs: Muscles appear normal for age. She has no leg edema today.  Neurologic: Strength is normal for age in both the upper and lower extremities. Muscle tone is normal. Sensation to touch is normal in both legs.  Left shin is somewhat painful to deer palpation. Right shin is only minimally sore.   LAB DATA:  12/10/12: Hgb 11.1, Hct 33.6%; TSH 2.964, free T4 1.06, free T3 2.4; 25-hydroxy vitamin D 37, PTH 89.4, calcium 9.9; CMP normal 06/11/12: CMP was normal, to include a calcium of 9.9; TSH 2.195,  free T4 0.98, free T3 2.6; PTH 102.3, 25-hydroxy vitamin D 29, 1,25-dihydroxy vitamin D 57. 11/17/11: PTH 70.9,calcium 10.0,  25-hydroxy vitamin D 40, 1,25-dihydroxy vitamin D 67, TSH 2.312. Free T4  0.97, Free T3 2.4 05/19/11: PTH 64, calcium 10.4, 25-hydroxy vitamin D 36, 1,25-dihydroxy vitamin D 47, TSH 2.497, free T4 1.04, free T3 2.6   Assessment and Plan:   ASSESSMENT:  1. Obesity: The patient has lost two more pounds. Once she can resume exercising, that will help to control weight.  2. Vitamin D deficiency: Patient was doing well in March 2014. Her vitamin D levels vary directly with her vitamin D intake.   3. Thyroiditis: Her Hashimoto's disease is clinically quiescent today. She was euthyroid in March, but at the lower end of the normal range.    4. Dyspepsia: She is doing better on omeprazole. 5. Hyperparathyroidism, secondary to calcium and vitamin D intake deficiency: Her PTH was lower in March, but still elevated, at a time when her vitamin D level was higher. Her PTH levels vary inversely with her intake of calcium and vitamin D.  6. Dysgeusia: Her dysgeusia has improved after resuming her Centrum Silver.  7. Goiter: Her thyroid gland is slightly enlarged today. The waxing and waning of the thyroid gland size in the past is another indicator of Hashimoto's disease. 8. Fatigue: Patient's fatigue has improved slightly.   9. Lymphoma: Her lymphoma is in remission. I'm very happy for her.   PLAN:  1. Diagnostic: TFTs, PTH, calcium, 25-hydroxy vitamin D today. Please repeat tests, to include a 1,25-dihydroxy vitamin D test 2 weeks prior to next  Visit.  2. Therapeutic: Please take all of her medications, to include her calcitriol, calcium, and vitamin D. Take a multivitamin pill daily. 3. Patient education: Since her lymphoma is in remission, it is important to protect her bones the best way we can. We need to keep up her intake of calcium and vitamin D. 4. Follow-up: 6 months  Level of Service: This visit lasted in excess of 60 minutes. More than 50% of the visit was devoted to  counseling.  David Stall,  MD 06/11/2013 9:24 AM

## 2013-06-12 LAB — VITAMIN D 25 HYDROXY (VIT D DEFICIENCY, FRACTURES): Vit D, 25-Hydroxy: 47 ng/mL (ref 30–89)

## 2013-06-17 ENCOUNTER — Encounter: Payer: Self-pay | Admitting: *Deleted

## 2013-12-09 ENCOUNTER — Encounter: Payer: Self-pay | Admitting: "Endocrinology

## 2013-12-09 ENCOUNTER — Ambulatory Visit (INDEPENDENT_AMBULATORY_CARE_PROVIDER_SITE_OTHER): Payer: Medicare PPO | Admitting: "Endocrinology

## 2013-12-09 VITALS — BP 115/73 | HR 88 | Wt 168.0 lb

## 2013-12-09 DIAGNOSIS — C189 Malignant neoplasm of colon, unspecified: Secondary | ICD-10-CM

## 2013-12-09 DIAGNOSIS — E669 Obesity, unspecified: Secondary | ICD-10-CM

## 2013-12-09 DIAGNOSIS — K3189 Other diseases of stomach and duodenum: Secondary | ICD-10-CM

## 2013-12-09 DIAGNOSIS — E063 Autoimmune thyroiditis: Secondary | ICD-10-CM

## 2013-12-09 DIAGNOSIS — R439 Unspecified disturbances of smell and taste: Secondary | ICD-10-CM

## 2013-12-09 DIAGNOSIS — R432 Parageusia: Secondary | ICD-10-CM

## 2013-12-09 DIAGNOSIS — E049 Nontoxic goiter, unspecified: Secondary | ICD-10-CM

## 2013-12-09 DIAGNOSIS — N2581 Secondary hyperparathyroidism of renal origin: Secondary | ICD-10-CM

## 2013-12-09 DIAGNOSIS — R5381 Other malaise: Secondary | ICD-10-CM

## 2013-12-09 DIAGNOSIS — E559 Vitamin D deficiency, unspecified: Secondary | ICD-10-CM

## 2013-12-09 DIAGNOSIS — R5383 Other fatigue: Secondary | ICD-10-CM

## 2013-12-09 DIAGNOSIS — R1013 Epigastric pain: Secondary | ICD-10-CM

## 2013-12-09 HISTORY — DX: Malignant neoplasm of colon, unspecified: C18.9

## 2013-12-09 MED ORDER — CALCITRIOL 0.25 MCG PO CAPS: ORAL_CAPSULE | ORAL | Status: DC

## 2013-12-09 NOTE — Progress Notes (Signed)
Subjective:  Patient Name: Joy Daniels Date of Birth: 03/10/1941  MRN: [id number]  Joy Daniels  presents to the office today for follow-up of her secondary hyperparathyroidism, hypocalcemia, vitamin D deficiency, iron deficiency, dysgeusia, dyspepsia, hypothyroidism, thyroiditis, nontoxic multinodular goiter, and mantle cell lymphoma.  HISTORY OF PRESENT ILLNESS:   Joy Daniels is a 73 y.o. Caucasian woman.  Joy Daniels was accompanied by her granddaughter, Joy Daniels.   1. The patient was first referred to me on 02/01/05 by her primary care physician, Dr. Micheal Likens, for follow up of toxic nodular goiter. The patient was then 73 years of age.   A.  The patient had been previously evaluated by Dr. Genelle Bal, an adult endocrinologist based in [place]. Dr. London Pepper was performing an outreach clinic at the [facility] in [place], [place]. He evaluated the patient for hyperthyroidism and found that she had a toxic nodular goiter. He arranged for her to have radioactive iodine treatment on 12/13/04 with 24.5 mCi of I-131. When Dr. London Pepper retired, I was asked to handle the outreach clinic for the next several months. At the patient's first visit with me, many of her hyperthyroid symptoms and signs had significantly improved. Past medical history was positive for hypertension, osteoporosis, hip pains, dyspepsia, and GERD. She had a previous injury to her right hand that required surgical repair. Family history was positive for cancer in one sister and a heart attack in another sister. Her brother had diabetes and had a stroke. On physical examination she was hypertensive. She had a 25+ gram goiter. She also had pallor of the nailbeds of her fingers.   B. At her next follow up visit in [place] on 03/29/05, she felt much better. Her energy was much improved. Muscle strength was improving. She was still significantly bothered by her excessive belly hunger that was causing weight gain. TFTs from 03/22/05 were normal, with a TSH  of 1.11, a free T4 of 1.22, and a free T3 of 2.8. I started her on Nexium 40 mg per day. Since I was getting ready to terminate the outreach clinic in [place], I made arrangements for her to be followed at my clinic in [place].   2. During the last 9 years that I have followed her here in [place], we have dealt with several different problem areas:  A. I followed two aspects of her multinodular goiter, the nodules themselves and her thyroid hormone status.   1. A thyroid ultrasound performed on 10/26/06 showed a 1.5 cm dominant hyperechoic mass in the left upper pole. There were 2 smaller nodules in the right lobe measuring 6.9 and 7.6 mm in diameter. On 12/14/06 a fine needle aspirate was performed on the dominant nodule in the left upper lobe. The pathologists diagnosis was: "Rare benign follicular cells, abundant histiocytes and colloid. No neoplasms identified." A follow up thyroid ultrasound performed on 11/05/07 showed an echogenic nodule in the mid left lobe measuring 1.7 x 1.1 x 0.8 cm. This was not grossly changed from images obtained during the previous fine needle aspirate. There was also a smaller nodule in the left lobe measuring 1.3 x 0.6 cm.  In the inferior aspect of the right lobe there was a nodule measuring 1.1 x 0.7 x 0.5. Her next thyroid ultrasound performed on 01/14/2009 showed a partially calcified nodule in the lower pole of the right lobe measuring 1.8 x 0.6 x 0.8. By report, this nodule had increased somewhat in size. However, because the images from the 2009 study were not available, no direct  comparison could be made. The largest nodule in the left lobe measured 1.6 x 1.0 x 0.8., essentially unchanged from the previous study.   2. The patient remained euthyroid through the spring of 2010. In September of 2010 and again in April of 2011 she became mildly hypothyroid. It appeared that she was having some flare ups of Hashimoto's disease at those times. Her TFTs subsequently  normalized on 11/26/10.  B. Because of her known osteoporosis, I wanted to treat her with a bisphosphonate. Since she had previously not tolerated Actonel well, she was reluctant. I finally convinced her to try Boniva, but she later discontinued that medication as well. She was not at all interested in doing Forteo injections. To further try to minimize bone loss, I monitored her bone mineral parameters closely. Up through her visit on 04/17/07 her parameters were normal, but she had many difficulties in taking all of her calcium and vitamin D pills, in part due to constipation. Unfortunately, by 12/03/07 her 25-hydroxy vitamin D dropped to 24.2 and her 1,25- dihydroxy vitamin D dropped to 23.3. It appeared not only was she deficient in vitamin D intake, but her kidneys were also not optimally converting 25-hydroxy vitamin D to 1, 25-dihydroxy hydroxy vitamin D. I prescribed vitamin D, 50,000 IU per week. I also added calcitriol, 0.25 mcg per week. When she took these medications, her bone parameters normalized. In early 2010, however, she had stopped taking these medications. Her PTH rose to 75 and then to 81.8. When she again took her calcium and her two vitamin D preparations, her PTH decreased progressively to 62.1 and then to 33 on 11/26/10. On that date, her serum calcium was 10.2, her 25-hydroxy vitamin D was 32.2, and her 1, 25- dihydroxy vitamin D was 28.2. By then, however, she had stopped taking the vitamin D 50,000 international unit weekly because her insurance company would not cover it.   3. The patient's last [facility] visit was on 06/11/13. In the interim, she shows no signs of recurrence of her mantle cell lymphoma. Unfortunately, however, in November she was diagnosed with cancer of the ascending colon. She had resection surgery and re-anastomosis in December. The surgeon thought she had a surgical cure. She is not taking any chemotherapy, but is followed by oncology every 4 months. Her energy and iron  are low. She has not had many COPD symptoms except when she had bronchitis. She has not needed to use either her inhaler or her nebulizer except when she had bronchitis. She still has episodic problems with nausea, vomiting, and diarrhea. She continues to have almost constant urinary incontinence despite being on medication. She is taking her calcitriol, .25, weekly; Citracal with D once daily; omeprazole 40, once daily; metoprolol Er, 50 mg, once daily; B12, 100 mcg, once daily; KCl ER 19 mEq, twice daily; Losartan/Hctz, 100/25, 1/2 tab daily; Enablex, 7.5 mg, daily; Welchol,  625, 3 packets, twice daily; Montelukast, 10 mg, once daily. Dr.Uppin ordered thyroid tests and found two results "out of range". The patient deferred to me.   4. Pertinent Review of Systems:  Constitutional: The patient feels tired a lot. She gets short of breath with exertion due to her COPD.  Her appetite is "ferocious, but I'm trying to cut back.".    Eyes: Vision is better since having bilateral cataractectomies. Her eyes are dry a lot, but she is not using the drops very often due to concerns about possible side effects of the drops.   Mouth: Mouth is  still dry.    Neck: She has not had any thyroid pains or inflammation. She occasionally had left lateral neck pains. She no longer has difficulty swallowing.   Heart: Heart rate increases with exercise or other physical activity. The patient has no complaints of palpitations, irregular heat beats, chest pain, or chest pressure. Lungs: Her COPD is better since completing chemotherapy. She still gets short of breath even with minor exertion.  Gastrointestinal: As above. Her bloating has resolved. Her constipation resolved with Miralax.  The patient has no complaints of excessive hunger, acid reflux, upset stomach, stomach aches or pains. Legs: She still has episodic calf cramps at night, 1-2 times per week.Muscle mass and strength seem normal. There are no complaints of numbness,  tingling, or burning. She has not had as much edema, despite stopping Lasix. She often has restless legs at night.  Feet: There are no obvious foot problems. There are no complaints of numbness, tingling, burning, or pain. No edema is noted. Neuro: Her balance is not so good.    PAST MEDICAL, FAMILY, AND SOCIAL HISTORY:  Past Medical History  Diagnosis Date  . Toxic nodular goiter   . Other osteoporosis   . GERD (gastroesophageal reflux disease)   . Iron deficiency   . Pallor   . Combined hyperlipidemia   . Hypercalcemia   . Nontoxic multinodular goiter   . Vitamin D deficiency disease   . Hyperparathyroidism , secondary, non-renal   . Thyroiditis, autoimmune   . Hypothyroidism, acquired, autoimmune   . COPD (chronic obstructive pulmonary disease)   . Dysgeusia   . Lymphoma, mantle cell, multiple sites   . Hypertension   . Obesity (BMI 30.0-34.9)     Family History  Problem Relation Age of Onset  . Diabetes Father   . Cancer Sister   . Heart disease Sister     Current outpatient prescriptions:metoprolol (LOPRESSOR) 50 MG tablet, Take 50 mg by mouth daily.  , Disp: , Rfl: ;  omeprazole (PRILOSEC) 10 MG capsule, Take 10 mg by mouth daily.  , Disp: , Rfl: ;  sertraline (ZOLOFT) 50 MG tablet, Take 50 mg by mouth daily.  , Disp: , Rfl: ;  furosemide (LASIX) 20 MG tablet, Take 20 mg by mouth 2 (two) times daily.  , Disp: , Rfl:   Allergies as of 12/09/2013 - Review Complete 12/09/2013  Allergen Reaction Noted  . Codeine  01/27/2011    1. Work and Family: Her family is supportive. Joy Daniels lives with her now.  2. Activities: She does not do any regular physical activity, but tries to stay active with shopping and house work. 3. Smoking, alcohol, or drugs: Unknown 4. Primary Care Provider: Dr. Gae Bon B. Uppin  REVIEW OF SYSTEMS: There are no other significant problems involving Joy Daniels's other body systems.   Objective:  Vital Signs:  BP 115/73  Pulse 88  Wt 168 lb (76.204  kg)   Ht Readings from Last 3 Encounters:  10/12/10 5' (1.524 m)  02/11/10 5' (1.524 m)   Wt Readings from Last 3 Encounters:  12/09/13 168 lb (76.204 kg)  06/11/13 174 lb (78.926 kg)  12/10/12 176 lb 4.8 oz (79.969 kg)   PHYSICAL EXAM:  Constitutional: The patient really seems pretty good today. She is perky, smiling, and laughing. She has lost 6 pounds since last visit.  Face: The face appears normal, but very lined and worn. She looks about 65 years older than her chronologic age.  Eyes: There is no obvious arcus  or proptosis. Moisture appears normal. Mouth: The oropharynx and tongue appear normal. Oral moisture is normal. Neck: The neck appears to be visibly normal. No carotid bruits are noted. The thyroid gland is low-lying. The thyroid gland is about 20 grams in size. The right lobe is well within normal limits for size. The left lobe is slightly enlarged today. The consistency of the thyroid gland is normal. The thyroid gland is not tender to palpation.  Lungs: The lungs are clear to auscultation. Air movement is good. Heart: Heart rate and rhythm are regular. Heart sounds S1 and S2 are normal. I did not appreciate any pathologic cardiac murmurs. Abdomen: The abdomen is enlarged. Bowel sounds are normal. There is no obvious hepatomegaly, splenomegaly, or other mass effect.  Arms: Muscle size and bulk are normal for age. Hands: There is no obvious tremor. Phalangeal and metacarpophalangeal joints are normal. Palmar muscles are normal. Palmar skin is normal. Palmar moisture is also normal. She has just a trace of pallor in her fingernails. Legs: Muscles appear normal for age. She has a trace of leg edema in the left shin.  Shins are again tender.   Neurologic: Strength is normal for age in both the upper and lower extremities. Muscle tone is normal. Sensation to touch is normal in both legs.    LAB DATA:   Labs 11/05/13: TSH 2.902, free T4 1.06, free T3 2.5; 1,25-dihydroxy vitamin D  39; 25-hydroxy vitamin D 47, PTH 72, calcium 10.1  Labs 12/10/12: Hgb 11.1, Hct 33.6%; TSH 2.964, free T4 1.06, free T3 2.4; 25-hydroxy vitamin D 37, PTH 89.4, calcium 9.9; CMP normal  Labs 06/11/12: CMP was normal, to include a calcium of 9.9; TSH 2.195,  free T4 0.98, free T3 2.6; PTH 102.3, 25-hydroxy vitamin D 29, 1,25-dihydroxy vitamin D 57.  Labs 11/17/11: PTH 70.9,calcium 10.0, 25-hydroxy vitamin D 40, 1,25-dihydroxy vitamin D 67, TSH 2.312. Free T4  0.97, Free T3 2.4  Labs 05/19/11: PTH 64, calcium 10.4, 25-hydroxy vitamin D 36, 1,25-dihydroxy vitamin D 47, TSH 2.497, free T4 1.04, free T3 2.6   Assessment and Plan:   ASSESSMENT:  1. Obesity: The patient has lost six more pounds. Once she can resume exercising, that will help to control weight.  2. Vitamin D deficiency: Patient's 25-hydroxy vitamin D level is good.  Her 1,25-dihydroxy vitamin D level is normal, but has been steadily decreasing for the past two ears. While this level is "normal" per the lab, this level is too low for her. Her kidneys no longer produce calcitriol as well at they did even four years ago.  3. Thyroiditis/goiter: Her Hashimoto's disease is clinically quiescent today, but her thyroid gland is a bit larger and more swollen, c/w evolving thyroiditis.  She was euthyroid in March 2014 and again in February 2015, but at the lower end of the normal range.    4. Dyspepsia: She is doing better on omeprazole. 5. Hyperparathyroidism, secondary to calcium and vitamin D intake deficiency: Her PTH was lower in March 2013, but still high. Her PTH level in February 2015 was even lower, at the upper limit of normal. The progressive decline in PTH with therapy proves that she does not have a parathyroid adenoma or hyperplasia. Her hyperparathyroidism in past years and again now is secondary to inadequate calcium and vitamin D intake and inadequate renal production of calcitriol.  Her PTH levels vary inversely with her intake of  calcium and vitamin D.  6. Dysgeusia: Her dysgeusia has improved after resuming  her Centrum Silver.  8. Fatigue: Patient's fatigue is still a big problem for her.    9. Lymphoma: Her lymphoma is in remission. I'm very happy for her. 10. Colon cancer: She is in remission. I'm very happy for her.    PLAN:  1. Diagnostic: Please repeat TFTs, calcium, PTH, and 25-hydroxy vitamin D 2 weeks prior to next  Visit.  2. Therapeutic: Increase calcitriol to one tablet/capsule, twice weekly. Increase the Citracal-D to twice daily. Please take all of her medications, to include her calcitriol, calcium, and vitamin D. Take a multivitamin pill daily. 3. 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 We need to keep up her intake of calcium and vitamin D. 4. Follow-up: 6 months  Level of Service: This visit lasted in excess of 60 minutes. More than 50% of the visit was devoted to counseling.  Sherrlyn Hock, MD 12/09/2013 10:27 AM

## 2013-12-09 NOTE — Patient Instructions (Signed)
Follow up visit in 6 months. Increase the calcitriol to two times weekly. Increase the Citracal-D to twice daily.

## 2014-06-11 ENCOUNTER — Encounter: Payer: Self-pay | Admitting: "Endocrinology

## 2014-06-11 ENCOUNTER — Ambulatory Visit (INDEPENDENT_AMBULATORY_CARE_PROVIDER_SITE_OTHER): Payer: Medicare PPO | Admitting: "Endocrinology

## 2014-06-11 VITALS — BP 132/79 | HR 99 | Wt 175.0 lb

## 2014-06-11 DIAGNOSIS — M79669 Pain in unspecified lower leg: Secondary | ICD-10-CM

## 2014-06-11 DIAGNOSIS — E669 Obesity, unspecified: Secondary | ICD-10-CM

## 2014-06-11 DIAGNOSIS — E049 Nontoxic goiter, unspecified: Secondary | ICD-10-CM

## 2014-06-11 DIAGNOSIS — E063 Autoimmune thyroiditis: Secondary | ICD-10-CM

## 2014-06-11 DIAGNOSIS — R432 Parageusia: Secondary | ICD-10-CM

## 2014-06-11 DIAGNOSIS — R5381 Other malaise: Secondary | ICD-10-CM

## 2014-06-11 DIAGNOSIS — E559 Vitamin D deficiency, unspecified: Secondary | ICD-10-CM

## 2014-06-11 DIAGNOSIS — E211 Secondary hyperparathyroidism, not elsewhere classified: Secondary | ICD-10-CM

## 2014-06-11 DIAGNOSIS — M79609 Pain in unspecified limb: Secondary | ICD-10-CM

## 2014-06-11 DIAGNOSIS — R439 Unspecified disturbances of smell and taste: Secondary | ICD-10-CM

## 2014-06-11 DIAGNOSIS — R5383 Other fatigue: Secondary | ICD-10-CM

## 2014-06-11 HISTORY — DX: Hypocalcemia: E83.51

## 2014-06-11 NOTE — Progress Notes (Signed)
Subjective:  Patient Name: Joy Daniels Date of Birth: 15-May-1941  MRN: [id number]  Joy Daniels  presents to the office today for follow-up of her secondary hyperparathyroidism, hypocalcemia, vitamin D deficiency, iron deficiency anemia, dysgeusia, dyspepsia, hypothyroidism, thyroiditis, nontoxic multinodular goiter, colon cancer, and mantle cell lymphoma.  HISTORY OF PRESENT ILLNESS:   Joy Daniels is a 73 y.o. Caucasian woman.  Joy Daniels was accompanied by her granddaughter, Joy Daniels.   1. The patient was first referred to me on 02/01/05 by her primary care physician, Dr. Micheal Daniels, for follow up of toxic nodular goiter. The patient was then 73 years of age.   A.  The patient had been previously evaluated by Dr. Genelle Daniels, an adult endocrinologist based in [place]. Dr. London Daniels was performing an outreach clinic at the [facility] in [place], [place]. He evaluated the patient for hyperthyroidism and found that she had a toxic nodular goiter. He arranged for her to have radioactive iodine treatment on 12/13/04 with 24.5 mCi of I-131. When Dr. London Daniels retired, I was asked to handle the outreach clinic for the next several months. At the patient's first visit with me, many of her hyperthyroid symptoms and signs had significantly improved. Past medical history was positive for hypertension, osteoporosis, hip pains, dyspepsia, and GERD. She had a previous injury to her right hand that required surgical repair. Family history was positive for cancer in one sister and a heart attack in another sister. Her brother had diabetes and had a stroke. On physical examination she was hypertensive. She had a 25+ gram goiter. She also had pallor of the nailbeds of her fingers.   B. At her next follow up visit in [place] on 03/29/05, she felt much better. Her energy was much improved. Muscle strength was improving. She was still significantly bothered by her excessive belly hunger that was causing weight gain. TFTs from 03/22/05 were  normal, with a TSH of 1.11, a free T4 of 1.22, and a free T3 of 2.8. I started her on Nexium 40 mg per day. Since I was getting ready to terminate the outreach clinic in [place], I made arrangements for her to be followed at my clinic in [place].   2. During the last 9 years that I have followed her here in [place], we have dealt with several different problem areas:  A. I followed two aspects of her multinodular goiter, the nodules themselves and her thyroid hormone status.   1. A thyroid ultrasound performed on 10/26/06 showed a 1.5 cm dominant hyperechoic mass in the left upper pole. There were 2 smaller nodules in the right lobe measuring 6.9 and 7.6 mm in diameter. On 12/14/06 a fine needle aspirate was performed on the dominant nodule in the left upper lobe. The pathologists diagnosis was: "Rare benign follicular cells, abundant histiocytes and colloid. No neoplasms identified." A follow up thyroid ultrasound performed on 11/05/07 showed an echogenic nodule in the mid left lobe measuring 1.7 x 1.1 x 0.8 cm. This was not grossly changed from images obtained during the previous fine needle aspirate. There was also a smaller nodule in the left lobe measuring 1.3 x 0.6 cm.  In the inferior aspect of the right lobe there was a nodule measuring 1.1 x 0.7 x 0.5. Her next thyroid ultrasound performed on 01/14/2009 showed a partially calcified nodule in the lower pole of the right lobe measuring 1.8 x 0.6 x 0.8. By report, this nodule had increased somewhat in size. However, because the images from the 2009 study were not  available, no direct comparison could be made. The largest nodule in the left lobe measured 1.6 x 1.0 x 0.8., essentially unchanged from the previous study.   2. The patient remained euthyroid through the spring of 2010. In September of 2010 and again in April of 2011 she became mildly hypothyroid. It appeared that she was having some flare ups of Hashimoto's disease at those times. Her TFTs  subsequently normalized on 11/26/10.  B. Because of her known osteoporosis, I wanted to treat her with a bisphosphonate. Since she had previously not tolerated Actonel well, she was reluctant. I finally convinced her to try Boniva, but she later discontinued that medication as well. She was not at all interested in doing Forteo injections. To further try to minimize bone loss, I monitored her bone mineral parameters closely. Up through her visit on 04/17/07 her parameters were normal, but she had many difficulties in taking all of her calcium and vitamin D pills, in part due to constipation. Unfortunately, by 12/03/07 her 25-hydroxy vitamin D dropped to 24.2 and her 1,25- dihydroxy vitamin D dropped to 23.3. It appeared not only was she deficient in vitamin D intake, but her kidneys were also not optimally converting 25-hydroxy vitamin D to 1, 25-dihydroxy hydroxy vitamin D. I prescribed vitamin D, 50,000 IU per week. I also added calcitriol, 0.25 mcg per week. When she took these medications, her bone parameters normalized. In early 2010, however, she had stopped taking these medications. Her PTH rose to 75 and then to 81.8. When she again took her calcium and her two vitamin D preparations, her PTH decreased progressively to 62.1 and then to 33 on 11/26/10. On that date, her serum calcium was 10.2, her 25-hydroxy vitamin D was 32.2, and her 1, 25- dihydroxy vitamin D was 28.2. By then, however, she had stopped taking the vitamin D 50,000 international unit weekly because her insurance company would not cover it.  C.In 2011 she developed stage IV mantle cell lymphoma of the tonsils, stomach, and digestive tract and was successfully treated with chemotherapy for two years. In late 2014 she developed cancer of the ascending and was successfully treated with surgery.    3. The patient's last [facility] visit was on 12/09/13. In the interim, she has been healthy. She shows no signs of recurrence of her mantle cell lymphoma or  her colon cancer. She is not taking any chemotherapy, but is followed by oncology every 4 months. Her energy is a little better. She has had some seasonal allergies, but has not had many COPD symptoms. She has not needed to use either her inhaler or her nebulizer. She has had only one episode of nausea, vomiting, and diarrhea since last visit.  She continues to have almost constant urinary incontinence despite being on medication. She is taking her calcitriol, .25, twice weekly; Citracal with D once daily; omeprazole 40, once daily; metoprolol ER, 50 mg, once daily; B12, 100 mcg, once daily; KCl ER 19 mEq, twice daily; Losartan/Hctz, 100/25, 1/2 tab daily; Enablex, 7.5 mg, daily; Welchol,  625, 3 packets, twice daily; Montelukast, 10 mg, once daily.   4. Pertinent Review of Systems:  Constitutional: The patient feels tired a lot. She gets short of breath with exertion due to her COPD.  Her appetite is "good", but she does not eat as much as she did 6 months ago.     Eyes: Vision is better since having bilateral cataractectomies. Her eye dryness has improved so she no longer takes the moisturizing  drops.    Mouth: Mouth is still dry.    Neck: She has not had any thyroid pains or inflammation. She occasionally had left postero-lateral neck pains, as she has had off and on for years. Her range of motion of the cervical spine is much reduced. She no longer has difficulty swallowing.   Heart: Heart rate increases with exercise or other physical activity. The patient has no complaints of palpitations, irregular heat beats, chest pain, or chest pressure. Lungs: Her COPD is better since completing chemotherapy. She still gets short of breath even with minor exertion.  Gastrointestinal: As above. Her bloating has resolved. Her constipation resolved with Miralax, as needed.  The patient has no complaints of excessive hunger, acid reflux, upset stomach, stomach aches or pains. Legs: She still has episodic calf cramps  at night, 1-2 times per week. Muscle mass and strength seem normal. There are no complaints of numbness, tingling, or burning. She has not had as much edema, usually related to salt. She often has restless legs at night.  Feet: There are no obvious foot problems. There are no complaints of numbness, tingling, burning, or pain. She has occasional edema.  Neuro: Her balance is not as good.    PAST MEDICAL, FAMILY, AND SOCIAL HISTORY:  Past Medical History  Diagnosis Date  . Toxic nodular goiter   . Other osteoporosis   . GERD (gastroesophageal reflux disease)   . Iron deficiency   . Pallor   . Combined hyperlipidemia   . Hypercalcemia   . Nontoxic multinodular goiter   . Vitamin D deficiency disease   . Hyperparathyroidism , secondary, non-renal   . Thyroiditis, autoimmune   . Hypothyroidism, acquired, autoimmune   . COPD (chronic obstructive pulmonary disease)   . Dysgeusia   . Lymphoma, mantle cell, multiple sites   . Hypertension   . Obesity (BMI 30.0-34.9)     Family History  Problem Relation Age of Onset  . Diabetes Father   . Cancer Sister   . Heart disease Sister     Current outpatient prescriptions:calcitRIOL (ROCALTROL) 0.25 MCG capsule, Take one capsule twice weekly., Disp: 25 capsule, Rfl: 3;  metoprolol (LOPRESSOR) 50 MG tablet, Take 50 mg by mouth daily.  , Disp: , Rfl: ;  omeprazole (PRILOSEC) 10 MG capsule, Take 10 mg by mouth daily.  , Disp: , Rfl: ;  sertraline (ZOLOFT) 50 MG tablet, Take 50 mg by mouth daily.  , Disp: , Rfl:  furosemide (LASIX) 20 MG tablet, Take 20 mg by mouth 2 (two) times daily.  , Disp: , Rfl:   Allergies as of 06/11/2014 - Review Complete 06/11/2014  Allergen Reaction Noted  . Codeine  01/27/2011    1. Work and Family: Her family is supportive. Greysen lives with her husband. Joy Daniels lives with her husband now.   2. Activities: She does not do any regular physical activity, but tries to stay active with shopping and house work. 3.  Smoking, alcohol, or drugs: Unknown 4. Primary Care Provider: Dr. Gae Bon B. Uppin  REVIEW OF SYSTEMS: There are no other significant problems involving Sissy's other body systems.   Objective:  Vital Signs:  BP 132/79  Pulse 99  Wt 175 lb (79.379 kg)   Ht Readings from Last 3 Encounters:  10/12/10 5' (1.524 m)  02/11/10 5' (1.524 m)   Wt Readings from Last 3 Encounters:  06/11/14 175 lb (79.379 kg)  12/09/13 168 lb (76.204 kg)  06/11/13 174 lb (78.926 kg)   PHYSICAL  EXAM:  Constitutional: The patient really seems pretty good today. She is alert, oriented, and quite lucid. She has gained 7 pounds since last visit.  Face: The face appears normal, but very lined and worn. She looks about 71 years older than her chronologic age.  Eyes: There is no obvious arcus or proptosis. Moisture appears normal. Mouth: The oropharynx and tongue appear normal. Oral moisture is normal. Neck: The neck appears to be visibly normal. No carotid bruits are noted. The thyroid gland is low-lying. The thyroid gland is smaller at about 18-20 grams in size.  Both lobes are normal in size today. The consistency of the thyroid gland is normal. The thyroid gland is not tender to palpation. She has limited range of motion of her cervical spine. Her left trapezius muscle and left nuchal cord are tight and tender.  Lungs: The lungs are clear to auscultation. Air movement is good. Heart: Heart rate and rhythm are regular. Heart sounds S1 and S2 are normal. I did not appreciate any pathologic cardiac murmurs. Abdomen: The abdomen is enlarged. Bowel sounds are normal. There is no obvious hepatomegaly, splenomegaly, or other mass effect.  Arms: Muscle size and bulk are normal for age. Hands: There is no obvious tremor. Phalangeal and metacarpophalangeal joints are normal. Palmar muscles are normal. Palmar skin is normal. Palmar moisture is also normal. Her nail pallor has resolved.  Legs: Muscles appear normal for age. She  has no edema in the lags today. Shins are again tender.   Neurologic: Strength is normal for age in both the upper and lower extremities. Muscle tone is normal. Sensation to touch is normal in both legs.    LAB DATA:   Labs 06/11/13: 1,25-dihydroxy vitamin D 39, 25-hydroxy vitamin D 47, PTH 72, calcium 10.1 ; TSH 2.901, free T4 1.06, free T3  2.5  Labs 11/05/13: TSH 2.902, free T4 1.06, free T3 2.5; 1,25-dihydroxy vitamin D 39; 25-hydroxy vitamin D 47, PTH 72, calcium 10.1  Labs 12/10/12: Hgb 11.1, Hct 33.6%; TSH 2.964, free T4 1.06, free T3 2.4; 25-hydroxy vitamin D 37, PTH 89.4, calcium 9.9; CMP normal  Labs 06/11/12: CMP was normal, to include a calcium of 9.9; TSH 2.195,  free T4 0.98, free T3 2.6; PTH 102.3, 25-hydroxy vitamin D 29, 1,25-dihydroxy vitamin D 57.  Labs 11/17/11: PTH 70.9,calcium 10.0, 25-hydroxy vitamin D 40, 1,25-dihydroxy vitamin D 67, TSH 2.312. Free T4  0.97, Free T3 2.4  Labs 05/19/11: PTH 64, calcium 10.4, 25-hydroxy vitamin D 36, 1,25-dihydroxy vitamin D 47, TSH 2.497, free T4 1.04, free T3 2.6   Assessment and Plan:   ASSESSMENT:  1. Obesity: The patient has re-gained 7 pounds. If she resumes exercising, that will help to control weight.  2. Vitamin D deficiency: Patient's 25-hydroxy vitamin D level was good one year ago.  Her 1,25-dihydroxy vitamin D level was normal, but had been steadily decreasing for the past two years. While this level was "normal" per the lab, this level was trending to be too low for her. Her kidneys no longer produce calcitriol as well at they did four years ago.  3. Thyroiditis/goiter: Her Hashimoto's disease is clinically quiescent today and her thyroid gland is a bit smaller. The waxing and waning of thyroid gland size is c/w thyroiditis.  She was euthyroid in March 2014 and again in February 2015, but at the lower end of the normal range.    4. Dyspepsia: She is doing better on omeprazole. 5. Hyperparathyroidism, secondary to calcium and  vitamin D intake deficiency: Her PTH was lower one year ago. The progressive decline in PTH with therapy proves that she does not have a parathyroid adenoma or hyperplasia. Her hyperparathyroidism in past years was secondary to inadequate calcium and vitamin D intake and inadequate renal production of calcitriol.  Her PTH levels vary inversely with her intake of calcium and vitamin D.  6. Dysgeusia: Her dysgeusia has improved after resuming her Centrum Silver.  8. Fatigue: Patient's fatigue is still a big problem for her, but is a bit better. .    9. Lymphoma: Her lymphoma is in remission. I'm very happy for her. 10. Colon cancer: She is in remission. I'm very happy for her.   11. Shin pains: This is likely due to inadequate vitamin D intake and/or calcium intake.  12. Calf cramps and restless legs:  I showed her the hurler's stretching exercise to be done for at least tow minutes before going to bed.  13. Trapezius spasm and tight nuchal cord: She likely has some degenerative arthritis of the cervical spine, causing pinching of her left cervical nerves. I showed her cervical stretching exercises that she can do several times per day.   PLAN:  1. Diagnostic: The patient will call Dr. Lauralee Evener office to ask that a copy of her recent labs be sent to me. I'll review the TFTs, calcium, PTH, and 25-hydroxy vitamin D 2 once I receive them. Repeat these labs 2 weeks prior to next visit.  2. Therapeutic: Continue calcitriol at one tablet/capsule, twice weekly. Increase the Citracal-D to twice daily. Please take all of her medications, to include her calcitriol, calcium, and vitamin D. Take a multivitamin pill daily. I asked Joy Daniels to write down a list of Shadaya's meds and call our office with an update.  3. 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 We need to keep up her intake of calcium and vitamin D. 4. Follow-up: 6  months  Level of Service: This visit lasted in excess of 55 minutes. More than 50% of the visit was devoted to counseling.  Sherrlyn Hock, MD 06/11/2014 10:21 AM

## 2014-06-11 NOTE — Patient Instructions (Signed)
Follow up visit in 6 months. Please have lab tests done prior to next visit.

## 2014-11-17 ENCOUNTER — Other Ambulatory Visit: Payer: Self-pay | Admitting: *Deleted

## 2014-11-17 DIAGNOSIS — E034 Atrophy of thyroid (acquired): Secondary | ICD-10-CM

## 2014-12-08 ENCOUNTER — Other Ambulatory Visit: Payer: Self-pay | Admitting: "Endocrinology

## 2014-12-10 ENCOUNTER — Encounter: Payer: Self-pay | Admitting: "Endocrinology

## 2014-12-10 ENCOUNTER — Ambulatory Visit (INDEPENDENT_AMBULATORY_CARE_PROVIDER_SITE_OTHER): Payer: Medicare PPO | Admitting: "Endocrinology

## 2014-12-10 VITALS — BP 117/54 | HR 99 | Wt 168.0 lb

## 2014-12-10 DIAGNOSIS — R432 Parageusia: Secondary | ICD-10-CM

## 2014-12-10 DIAGNOSIS — E559 Vitamin D deficiency, unspecified: Secondary | ICD-10-CM

## 2014-12-10 DIAGNOSIS — E063 Autoimmune thyroiditis: Secondary | ICD-10-CM

## 2014-12-10 DIAGNOSIS — E211 Secondary hyperparathyroidism, not elsewhere classified: Secondary | ICD-10-CM

## 2014-12-10 DIAGNOSIS — E049 Nontoxic goiter, unspecified: Secondary | ICD-10-CM | POA: Diagnosis not present

## 2014-12-10 DIAGNOSIS — E782 Mixed hyperlipidemia: Secondary | ICD-10-CM

## 2014-12-10 DIAGNOSIS — R5383 Other fatigue: Secondary | ICD-10-CM

## 2014-12-10 NOTE — Patient Instructions (Signed)
Follow up visit in 6 months. 

## 2014-12-10 NOTE — Progress Notes (Signed)
Subjective:  Patient Name: Joy Daniels Date of Birth: 12/03/1940  MRN: [id number]  Joy Daniels  presents to the office today for follow-up of her secondary hyperparathyroidism, hypocalcemia, vitamin D deficiency, iron deficiency anemia, dysgeusia, dyspepsia, intermittent abnormal TFTS/hypothyroidism, thyroiditis, nontoxic multinodular goiter, colon cancer, and mantle cell lymphoma.  HISTORY OF PRESENT ILLNESS:   Joy Daniels is a 74 y.o. Caucasian woman.  [name] was accompanied by her daughter, Joy Daniels.   1. The patient was first referred to me on 02/01/05 by her primary care physician, Dr. Micheal Likens, for follow up of toxic nodular goiter. The patient was then 74 years of age.   A.  The patient had been previously evaluated by Dr. Genelle Bal, an adult endocrinologist based in [place]. Dr. London Pepper was performing an outreach clinic at the [facility] in [place], [place]. He evaluated the patient for hyperthyroidism and found that she had a toxic nodular goiter. He arranged for her to have radioactive iodine treatment on 12/13/04 with 24.5 mCi of I-131. When Dr. London Pepper retired, I was asked to handle the outreach clinic for the next several months. At the patient's first visit with me, many of her hyperthyroid symptoms and signs had significantly improved. Past medical history was positive for hypertension, osteoporosis, hip pains, dyspepsia, and GERD. She had a previous injury to her right hand that required surgical repair. Family history was positive for cancer in one sister and a heart attack in another sister. Her brother had diabetes and had a stroke. On physical examination she was hypertensive. She had a 25+ gram goiter. She also had pallor of the nailbeds of her fingers.   B. At her next follow up visit in [place] on 03/29/05, she felt much better. Her energy was much improved. Muscle strength was improving. She was still significantly bothered by her excessive belly hunger that was causing weight gain.  TFTs from 03/22/05 were normal, with a TSH of 1.11, a free T4 of 1.22, and a free T3 of 2.8. I started her on Nexium 40 mg per day. Since I was getting ready to terminate the outreach clinic in [place], I made arrangements for her to be followed at my clinic in [place].   2. During the last 10 years that I have followed her here in [place], we have dealt with several different problem areas:  A. I followed two aspects of her multinodular goiter, the nodules themselves and her thyroid hormone status.   1. A thyroid ultrasound performed on 10/26/06 showed a 1.5 cm dominant hyperechoic mass in the left upper pole. There were 2 smaller nodules in the right lobe measuring 6.9 and 7.6 mm in diameter. On 12/14/06 a fine needle aspirate was performed on the dominant nodule in the left upper lobe. The pathologists diagnosis was: "Rare benign follicular cells, abundant histiocytes and colloid. No neoplasms identified." A follow up thyroid ultrasound performed on 11/05/07 showed an echogenic nodule in the mid left lobe measuring 1.7 x 1.1 x 0.8 cm. This was not grossly changed from images obtained during the previous fine needle aspirate. There was also a smaller nodule in the left lobe measuring 1.3 x 0.6 cm.  In the inferior aspect of the right lobe there was a nodule measuring 1.1 x 0.7 x 0.5. Her next thyroid ultrasound performed on 01/14/2009 showed a partially calcified nodule in the lower pole of the right lobe measuring 1.8 x 0.6 x 0.8. By report, this nodule had increased somewhat in size. However, because the images from the 2009 study  were not available, no direct comparison could be made. The largest nodule in the left lobe measured 1.6 x 1.0 x 0.8., essentially unchanged from the previous study.   2. The patient remained euthyroid through the spring of 2010. In September of 2010 and again in April of 2011 she became mildly hypothyroid. It appeared that she was having some flare ups of Hashimoto's disease at  those times. Her TFTs subsequently normalized on 11/26/10.  B. Because of her known osteoporosis, I wanted to treat her with a bisphosphonate. Since she had previously not tolerated Actonel well, she was reluctant. I finally convinced her to try Boniva, but she later discontinued that medication as well. She was not at all interested in doing Forteo injections. To further try to minimize bone loss, I monitored her bone mineral parameters closely. Up through her visit on 04/17/07 her parameters were normal, but she had many difficulties in taking all of her calcium and vitamin D pills, in part due to constipation. Unfortunately, by 12/03/07 her 25-hydroxy vitamin D dropped to 24.2 and her 1,25- dihydroxy vitamin D dropped to 23.3. It appeared not only was she deficient in vitamin D intake, but her kidneys were also not optimally converting 25-hydroxy vitamin D to 1, 25-dihydroxy hydroxy vitamin D. I prescribed vitamin D, 50,000 IU per week. I also added calcitriol, 0.25 mcg per week. When she took these medications, her bone parameters normalized. In early 2010, however, she had stopped taking these medications. Her PTH rose to 75 and then to 81.8. When she again took her calcium and her two vitamin D preparations, her PTH decreased progressively to 62.1 and then to 33 on 11/26/10. On that date, her serum calcium was 10.2, her 25-hydroxy vitamin D was 32.2, and her 1, 25- dihydroxy vitamin D was 28.2. By then, however, she had stopped taking the vitamin D 50,000 international unit weekly because her insurance company would not cover it. Her calcium levels, vitamin D levels, and PTH levels have fluctuated ever since.   C.In 2011 she developed stage IV mantle cell lymphoma of the tonsils, stomach, and digestive tract and was successfully treated with chemotherapy for two years. In late 2014 she developed cancer of the ascending colon and was successfully treated with surgery.    3. The patient's last [facility] visit was on  06/11/14. In the interim, she has been healthy. She shows no signs of recurrence of her mantle cell lymphoma or her colon cancer. She is followed by oncology every 4 months. Her energy is a little better, but not as high as she would like. She has been taking care of her 60 month-old great grandchild. She has had some seasonal allergies, but has not had many COPD symptoms. She has not needed to use either her inhaler or her nebulizer.  She continues to have frequent urinary incontinence despite being on medication. She is taking her calcitriol, .25, twice weekly; Citracal with D twice daily; omeprazole 40, once daily; metoprolol ER, 50 mg, once daily; B12, 100 mcg, once daily; KCl ER 19 mEq, twice daily; Losartan/Hctz, 100/25, 1/2 tab daily; Enablex, 7.5 mg, daily; Welchol,  625, 3 packets, twice daily; Montelukast, 10 mg, once daily. She recently re-started Vitamin D, 50.000 IU weekly.   4. Pertinent Review of Systems:  Constitutional: The patient feels less tired. She still gets short of breath with exertion due to her COPD.  Her appetite is "good", but she has consciously cut back on her food intake in an effort to  lose weight. Food tastes good.  Eyes: Vision is better since having bilateral cataractectomies. Her eye dryness has improved so she no longer takes the moisturizing drops.    Mouth: Mouth is still dry.    Neck: She has not had any thyroid pains or inflammation. She occasionally had left postero-lateral neck pains, as she has had off and on for years. Her range of motion of the cervical spine is much reduced. She no longer has difficulty swallowing.   Heart: Heart rate increases with physical activity. The patient has no complaints of palpitations, irregular heat beats, chest pain, or chest pressure. Lungs: Her COPD is better, but she still gets short of breath even with minor exertion.  Gastrointestinal: She still has occasional diarrhea, which her daughter blames on drinking orange juice. Her  bloating has resolved. Her constipation resolved with Miralax, as needed.  The patient has no complaints of excessive hunger, acid reflux, upset stomach, stomach aches or pains. Legs: She still has episodic calf cramps at night, 1-2 times per week. Calf cramps are reduced if she stretches before going to bed. Muscle mass and strength seem normal. There are no complaints of numbness, tingling, or burning. She has not had as much edema, usually related to salt. She often has restless legs at night.  Feet: There are no obvious foot problems. There are no complaints of numbness, tingling, burning, or pain. She has occasional edema.  Neuro: Her balance is not as good.    PAST MEDICAL, FAMILY, AND SOCIAL HISTORY:  Past Medical History  Diagnosis Date  . Toxic nodular goiter   . Other osteoporosis   . GERD (gastroesophageal reflux disease)   . Iron deficiency   . Pallor   . Combined hyperlipidemia   . Hypercalcemia   . Nontoxic multinodular goiter   . Vitamin D deficiency disease   . Hyperparathyroidism , secondary, non-renal   . Thyroiditis, autoimmune   . Hypothyroidism, acquired, autoimmune   . COPD (chronic obstructive pulmonary disease)   . Dysgeusia   . Lymphoma, mantle cell, multiple sites   . Hypertension   . Obesity (BMI 30.0-34.9)     Family History  Problem Relation Age of Onset  . Diabetes Father   . Cancer Sister   . Heart disease Sister      Current outpatient prescriptions:  .  calcitRIOL (ROCALTROL) 0.25 MCG capsule, TAKE ONE CAPSULE TWICE WEEKLY., Disp: 25 capsule, Rfl: 3 .  colesevelam (WELCHOL) 625 MG tablet, Take by mouth 2 (two) times daily with a meal. 3 tablest bid, Disp: , Rfl:  .  ezetimibe (ZETIA) 10 MG tablet, Take 10 mg by mouth daily., Disp: , Rfl:  .  hydrochlorothiazide (MICROZIDE) 12.5 MG capsule, Take 12.5 mg by mouth daily., Disp: , Rfl:  .  losartan (COZAAR) 25 MG tablet, Take 25 mg by mouth daily., Disp: , Rfl:  .  montelukast (SINGULAIR) 10 MG  tablet, Take 10 mg by mouth at bedtime., Disp: , Rfl:  .  omeprazole (PRILOSEC) 10 MG capsule, Take 10 mg by mouth daily.  , Disp: , Rfl:  .  potassium chloride (K-DUR) 10 MEQ tablet, Take 10 mEq by mouth 2 (two) times daily., Disp: , Rfl:   Allergies as of 12/10/2014 - Review Complete 12/10/2014  Allergen Reaction Noted  . Codeine  01/27/2011    1. Work and Family: Her family is supportive. Kenny lives with her husband, daughter Joy Daniels, and two grandsons.    2. Activities: She does not do any  regular physical activity, but tries to stay active with shopping and house work. 3. Smoking, alcohol, or drugs: Unknown 4. Primary Care Provider: Dr. Gae Bon B. Uppin  REVIEW OF SYSTEMS: There are no other significant problems involving Estela's other body systems.   Objective:  Vital Signs:  BP 117/54 mmHg  Pulse 99  Wt 168 lb (76.204 kg)   Ht Readings from Last 3 Encounters:  10/12/10 5' (1.524 m)  02/11/10 5' (1.524 m)   Wt Readings from Last 3 Encounters:  12/10/14 168 lb (76.204 kg)  06/11/14 175 lb (79.379 kg)  12/09/13 168 lb (76.204 kg)   PHYSICAL EXAM:  Constitutional: The patient really seems good today. She is alert, oriented, and quite lucid. Her sense of humor and affect are very normal. She has lost 7 pounds since last visit.  Face: The face appears very lined and worn. She looks about 51 years older than her chronologic age.  Eyes: There is no obvious arcus or proptosis. Moisture appears normal. Mouth: The oropharynx and tongue appear normal. Oral moisture is normal. Neck: The neck appears to be visibly normal. No carotid bruits are noted. The thyroid gland is low-lying. The thyroid gland is again at about 18-20 grams in size.  Both lobes are normal in size today. The consistency of the thyroid gland is normal. The thyroid gland is not tender to palpation. She has limited range of motion of her cervical spine. Her left trapezius muscle and left nuchal cord are tight and tender.   Lungs: The lungs are clear to auscultation. Air movement is good. Heart: Heart rate and rhythm are regular. Heart sounds S1 and S2 are normal. I did not appreciate any pathologic cardiac murmurs. Abdomen: The abdomen is enlarged. Bowel sounds are normal. There is no obvious hepatomegaly, splenomegaly, or other mass effect.  Arms: Muscle size and bulk are normal for age. Hands: There is no obvious tremor. Phalangeal and metacarpophalangeal joints are normal. Palmar muscles are normal. Palmar skin is normal. Palmar moisture is also normal. Her nail pallor has resolved.  Legs: Muscles appear normal for age. She has no edema in the lags today. Neurologic: Strength is normal for age in both the upper and lower extremities. Muscle tone is normal. Sensation to touch is normal in both legs.    LAB DATA:   Labs: 11/18/14: 25-0H vitamin D 32.9, calcium 10.1; ALT 42; cholesterol 237, triglycerides 290, HDL 46, LDL 133  Labs 08/14/14: 25-OH vitamin D 31.9, calcium 10.6; ALT 33; TSH 3.47   Labs 11/05/13: TSH 2.902, free T4 1.06, free T3 2.5; 1,25-dihydroxy vitamin D 39; 25-hydroxy vitamin D 47, PTH 72, calcium 10.1  Labs 06/11/13: 1,25-dihydroxy vitamin D 39, 25-hydroxy vitamin D 47, PTH 72, calcium 10.1 ; TSH 2.901, free T4 1.06, free T3  2.5  Labs 12/10/12: Hgb 11.1, Hct 33.6%; TSH 2.964, free T4 1.06, free T3 2.4; 25-hydroxy vitamin D 37, PTH 89.4, calcium 9.9; CMP normal  Labs 06/11/12: CMP was normal, to include a calcium of 9.9; TSH 2.195,  free T4 0.98, free T3 2.6; PTH 102.3, 25-hydroxy vitamin D 29, 1,25-dihydroxy vitamin D 57.  Labs 11/17/11: PTH 70.9,calcium 10.0, 25-hydroxy vitamin D 40, 1,25-dihydroxy vitamin D 67, TSH 2.312. Free T4  0.97, Free T3 2.4  Labs 05/19/11: PTH 64, calcium 10.4, 25-hydroxy vitamin D 36, 1,25-dihydroxy vitamin D 47, TSH 2.497, free T4 1.04, free T3 2.6   Assessment and Plan:   ASSESSMENT:  1. Obesity: The patient has lost the 7 pounds that  she had gained at her last  visit. If she resumes exercising, that will help to control weight.  2. Vitamin D deficiency: Patient's 25-hydroxy vitamin D level was in the low normal range in December 2015 and March 2016.  Her last 1,25-dihydroxy vitamin D level in February 2015 was normal, but had decreased in the past two years.   3. Thyroiditis/goiter: Her Hashimoto's disease is clinically quiescent today and her thyroid gland is again normal in size.  The waxing and waning of thyroid gland size is c/w thyroiditis.  She was euthyroid in March 2014 and again in February 2015, but at the lower end of the normal range. In December 2015 she was borderline hypothyroid.   4. Dyspepsia: She is doing better on omeprazole. 5. Hyperparathyroidism, secondary to calcium and vitamin D intake deficiency: Her PTH was lower two years ago, much lower than in September 2013. We need to repeat her labs now.  6. Dysgeusia: Her dysgeusia improved after resuming her Centrum Silver.  8. Fatigue: Patient's fatigue is better, but is still a problem for her.  9. Lymphoma: Her lymphoma is in remission. I'm glad. 10. Colon cancer: She is in remission. I'm glad.   11. Shin pains: This has not been an issue recently..  12. Calf cramps and restless legs:  I again showed her the hurdler's stretching exercise to be done for at least two minutes before going to bed.  13. Trapezius spasm and tight nuchal cord: She likely has some degenerative arthritis of the cervical spine, causing pinching of her left cervical nerves. I asked her to resume her cervical stretching exercises that she can do several times per day.  14. Combined hyperlipidemia: This was anew problem for me to discuss with her. She already takes Welchol, but my sense is that she is not always as compliant as she should be.   PLAN:  1. Diagnostic: Repeat lipid panel, TFTs, calcium, PTH. 25-OH vitamin D, and 1,25-dihydroxy vitamin D. Repeat labs, except for the 1,25-dihydroxy vitamin D, in 6 months.   2. Therapeutic: Continue calcitriol at one tablet/capsule, twice weekly. Please take all of her medications, to include her calcitriol, calcium, Welchol, and vitamin D. Continue to take a multivitamin pill daily.  3. 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 We need to keep up her intake of calcium and vitamin D. 4. Follow-up: 6 months  Level of Service: This visit lasted in excess of 55 minutes. More than 50% of the visit was devoted to counseling.  Sherrlyn Hock, MD 12/10/2014 10:53 AM

## 2014-12-11 LAB — T3, FREE: T3 FREE: 2.6 pg/mL (ref 2.3–4.2)

## 2014-12-11 LAB — TSH: TSH: 2.204 u[IU]/mL (ref 0.350–4.500)

## 2014-12-11 LAB — LIPID PANEL
Cholesterol: 184 mg/dL (ref 0–200)
HDL: 35 mg/dL — AB (ref >=46)
LDL CALC: 98 mg/dL (ref 0–99)
Total CHOL/HDL Ratio: 5.3 Ratio
Triglycerides: 255 mg/dL — ABNORMAL HIGH (ref <150)
VLDL: 51 mg/dL — ABNORMAL HIGH (ref 0–40)

## 2014-12-11 LAB — CALCIUM: Calcium: 10.2 mg/dL (ref 8.4–10.5)

## 2014-12-11 LAB — PTH, INTACT AND CALCIUM
Calcium: 10.2 mg/dL (ref 8.4–10.5)
PTH: 60 pg/mL (ref 14–64)

## 2014-12-11 LAB — T4, FREE: FREE T4: 1.08 ng/dL (ref 0.80–1.80)

## 2014-12-11 LAB — VITAMIN D 25 HYDROXY (VIT D DEFICIENCY, FRACTURES): Vit D, 25-Hydroxy: 40 ng/mL (ref 30–100)

## 2015-01-14 ENCOUNTER — Encounter: Payer: Self-pay | Admitting: *Deleted

## 2015-03-23 DIAGNOSIS — I10 Essential (primary) hypertension: Secondary | ICD-10-CM | POA: Diagnosis not present

## 2015-03-23 DIAGNOSIS — E785 Hyperlipidemia, unspecified: Secondary | ICD-10-CM | POA: Diagnosis not present

## 2015-03-23 DIAGNOSIS — Z1389 Encounter for screening for other disorder: Secondary | ICD-10-CM | POA: Diagnosis not present

## 2015-03-23 DIAGNOSIS — Z79899 Other long term (current) drug therapy: Secondary | ICD-10-CM | POA: Diagnosis not present

## 2015-03-23 DIAGNOSIS — Z9181 History of falling: Secondary | ICD-10-CM | POA: Diagnosis not present

## 2015-03-23 DIAGNOSIS — Z1212 Encounter for screening for malignant neoplasm of rectum: Secondary | ICD-10-CM | POA: Diagnosis not present

## 2015-03-23 DIAGNOSIS — K219 Gastro-esophageal reflux disease without esophagitis: Secondary | ICD-10-CM | POA: Diagnosis not present

## 2015-03-23 DIAGNOSIS — E559 Vitamin D deficiency, unspecified: Secondary | ICD-10-CM | POA: Diagnosis not present

## 2015-03-23 DIAGNOSIS — Z0000 Encounter for general adult medical examination without abnormal findings: Secondary | ICD-10-CM | POA: Diagnosis not present

## 2015-03-23 DIAGNOSIS — R928 Other abnormal and inconclusive findings on diagnostic imaging of breast: Secondary | ICD-10-CM | POA: Diagnosis not present

## 2015-04-06 DIAGNOSIS — R928 Other abnormal and inconclusive findings on diagnostic imaging of breast: Secondary | ICD-10-CM | POA: Diagnosis not present

## 2015-04-24 DIAGNOSIS — R7989 Other specified abnormal findings of blood chemistry: Secondary | ICD-10-CM | POA: Diagnosis not present

## 2015-04-24 DIAGNOSIS — Z6831 Body mass index (BMI) 31.0-31.9, adult: Secondary | ICD-10-CM | POA: Diagnosis not present

## 2015-04-24 DIAGNOSIS — J029 Acute pharyngitis, unspecified: Secondary | ICD-10-CM | POA: Diagnosis not present

## 2015-05-20 DIAGNOSIS — R970 Elevated carcinoembryonic antigen [CEA]: Secondary | ICD-10-CM | POA: Diagnosis not present

## 2015-05-20 DIAGNOSIS — C8319 Mantle cell lymphoma, extranodal and solid organ sites: Secondary | ICD-10-CM | POA: Diagnosis not present

## 2015-05-22 DIAGNOSIS — Z85038 Personal history of other malignant neoplasm of large intestine: Secondary | ICD-10-CM | POA: Diagnosis not present

## 2015-06-10 ENCOUNTER — Other Ambulatory Visit: Payer: Self-pay | Admitting: *Deleted

## 2015-06-10 DIAGNOSIS — E211 Secondary hyperparathyroidism, not elsewhere classified: Secondary | ICD-10-CM

## 2015-06-15 ENCOUNTER — Ambulatory Visit: Payer: Medicare PPO | Admitting: "Endocrinology

## 2015-06-23 DIAGNOSIS — Z6832 Body mass index (BMI) 32.0-32.9, adult: Secondary | ICD-10-CM | POA: Diagnosis not present

## 2015-06-23 DIAGNOSIS — E785 Hyperlipidemia, unspecified: Secondary | ICD-10-CM | POA: Diagnosis not present

## 2015-06-23 DIAGNOSIS — Z23 Encounter for immunization: Secondary | ICD-10-CM | POA: Diagnosis not present

## 2015-06-23 DIAGNOSIS — J309 Allergic rhinitis, unspecified: Secondary | ICD-10-CM | POA: Diagnosis not present

## 2015-06-23 DIAGNOSIS — E559 Vitamin D deficiency, unspecified: Secondary | ICD-10-CM | POA: Diagnosis not present

## 2015-06-23 DIAGNOSIS — I10 Essential (primary) hypertension: Secondary | ICD-10-CM | POA: Diagnosis not present

## 2015-06-23 DIAGNOSIS — J439 Emphysema, unspecified: Secondary | ICD-10-CM | POA: Diagnosis not present

## 2015-09-18 DIAGNOSIS — C8319 Mantle cell lymphoma, extranodal and solid organ sites: Secondary | ICD-10-CM | POA: Diagnosis not present

## 2015-09-21 DIAGNOSIS — D72820 Lymphocytosis (symptomatic): Secondary | ICD-10-CM | POA: Diagnosis not present

## 2015-09-21 DIAGNOSIS — C8310 Mantle cell lymphoma, unspecified site: Secondary | ICD-10-CM | POA: Diagnosis not present

## 2015-09-21 DIAGNOSIS — Z85038 Personal history of other malignant neoplasm of large intestine: Secondary | ICD-10-CM | POA: Diagnosis not present

## 2015-09-30 DIAGNOSIS — E669 Obesity, unspecified: Secondary | ICD-10-CM | POA: Diagnosis not present

## 2015-09-30 DIAGNOSIS — J209 Acute bronchitis, unspecified: Secondary | ICD-10-CM | POA: Diagnosis not present

## 2015-09-30 DIAGNOSIS — J019 Acute sinusitis, unspecified: Secondary | ICD-10-CM | POA: Diagnosis not present

## 2015-09-30 DIAGNOSIS — R05 Cough: Secondary | ICD-10-CM | POA: Diagnosis not present

## 2015-09-30 DIAGNOSIS — Z6832 Body mass index (BMI) 32.0-32.9, adult: Secondary | ICD-10-CM | POA: Diagnosis not present

## 2015-09-30 DIAGNOSIS — R0602 Shortness of breath: Secondary | ICD-10-CM | POA: Diagnosis not present

## 2015-10-22 DIAGNOSIS — E559 Vitamin D deficiency, unspecified: Secondary | ICD-10-CM | POA: Diagnosis not present

## 2015-10-22 DIAGNOSIS — E785 Hyperlipidemia, unspecified: Secondary | ICD-10-CM | POA: Diagnosis not present

## 2015-10-22 DIAGNOSIS — J309 Allergic rhinitis, unspecified: Secondary | ICD-10-CM | POA: Diagnosis not present

## 2015-10-22 DIAGNOSIS — I10 Essential (primary) hypertension: Secondary | ICD-10-CM | POA: Diagnosis not present

## 2015-10-22 DIAGNOSIS — N3281 Overactive bladder: Secondary | ICD-10-CM | POA: Diagnosis not present

## 2015-10-22 DIAGNOSIS — R112 Nausea with vomiting, unspecified: Secondary | ICD-10-CM | POA: Diagnosis not present

## 2015-10-22 DIAGNOSIS — Z6832 Body mass index (BMI) 32.0-32.9, adult: Secondary | ICD-10-CM | POA: Diagnosis not present

## 2015-10-22 DIAGNOSIS — K219 Gastro-esophageal reflux disease without esophagitis: Secondary | ICD-10-CM | POA: Diagnosis not present

## 2015-10-22 DIAGNOSIS — J439 Emphysema, unspecified: Secondary | ICD-10-CM | POA: Diagnosis not present

## 2015-12-21 ENCOUNTER — Encounter: Payer: Self-pay | Admitting: *Deleted

## 2015-12-22 ENCOUNTER — Ambulatory Visit (INDEPENDENT_AMBULATORY_CARE_PROVIDER_SITE_OTHER): Payer: Medicare PPO | Admitting: "Endocrinology

## 2015-12-22 VITALS — BP 139/77 | HR 82 | Wt 168.8 lb

## 2015-12-22 DIAGNOSIS — E669 Obesity, unspecified: Secondary | ICD-10-CM

## 2015-12-22 DIAGNOSIS — E211 Secondary hyperparathyroidism, not elsewhere classified: Secondary | ICD-10-CM

## 2015-12-22 DIAGNOSIS — E559 Vitamin D deficiency, unspecified: Secondary | ICD-10-CM

## 2015-12-22 DIAGNOSIS — E063 Autoimmune thyroiditis: Secondary | ICD-10-CM

## 2015-12-22 DIAGNOSIS — E049 Nontoxic goiter, unspecified: Secondary | ICD-10-CM

## 2015-12-22 LAB — T3, FREE: T3 FREE: 2.4 pg/mL (ref 2.3–4.2)

## 2015-12-22 LAB — T4, FREE: FREE T4: 1.2 ng/dL (ref 0.8–1.8)

## 2015-12-22 LAB — COMPREHENSIVE METABOLIC PANEL
ALBUMIN: 4.3 g/dL (ref 3.6–5.1)
ALT: 48 U/L — ABNORMAL HIGH (ref 6–29)
AST: 39 U/L — ABNORMAL HIGH (ref 10–35)
Alkaline Phosphatase: 67 U/L (ref 33–130)
BUN: 15 mg/dL (ref 7–25)
CALCIUM: 10 mg/dL (ref 8.6–10.4)
CHLORIDE: 104 mmol/L (ref 98–110)
CO2: 25 mmol/L (ref 20–31)
Creat: 0.91 mg/dL (ref 0.60–0.93)
GLUCOSE: 92 mg/dL (ref 70–99)
Potassium: 3.8 mmol/L (ref 3.5–5.3)
Sodium: 142 mmol/L (ref 135–146)
Total Bilirubin: 1 mg/dL (ref 0.2–1.2)
Total Protein: 7.3 g/dL (ref 6.1–8.1)

## 2015-12-22 LAB — TSH: TSH: 2.66 mIU/L

## 2015-12-22 NOTE — Progress Notes (Signed)
Subjective:  Patient Name: Joy Daniels Date of Birth: [birth year]  MRN: [id number]  Joy Daniels  presents to the office today for follow-up of her secondary hyperparathyroidism, hypocalcemia, vitamin D deficiency, iron deficiency anemia, dysgeusia, dyspepsia, intermittent abnormal TFTs/hypothyroidism, thyroiditis, nontoxic multinodular goiter, s/p colon cancer, and s/p mantle cell lymphoma.  HISTORY OF PRESENT ILLNESS:   Joy Daniels is a 75 y.o. Caucasian woman.  Joy Daniels was accompanied by her daughter, Joy Daniels, and Joy Daniels's little boy.   1. The patient was first referred to me on 02/01/05 by her primary care physician, Dr. Micheal Likens, for follow up of toxic nodular goiter. The patient was then 75 years of age.   A.  The patient had been previously evaluated by Dr. Genelle Bal, an adult endocrinologist based in [place]. Dr. London Pepper was performing an outreach clinic at the [facility] in [place], [place]. He evaluated the patient for hyperthyroidism and found that she had a toxic nodular goiter. He arranged for her to have radioactive iodine treatment on 12/13/04 with 24.5 mCi of I-131. When Dr. London Pepper retired, I was asked to handle the outreach clinic for the next several months. At the patient's first visit with me, many of her hyperthyroid symptoms and signs had significantly improved. Past medical history was positive for hypertension, osteoporosis, hip pains, dyspepsia, and GERD. She had a previous injury to her right hand that required surgical repair. Family history was positive for cancer in one sister and a heart attack in another sister. Her brother had diabetes and had a stroke. On physical examination she was hypertensive. She had a 25+ gram goiter. She also had pallor of the nailbeds of her fingers.   B. At her next follow up visit in [place] on 03/29/05, she felt much better. Her energy was much improved. Muscle strength was improving. She was still significantly bothered by her excessive belly hunger  that was causing weight gain. TFTs from 03/22/05 were normal, with a TSH of 1.11, a free T4 of 1.22, and a free T3 of 2.8. I started her on Nexium 40 mg per day. Since I was getting ready to terminate the outreach clinic in [place], I made arrangements for her to be followed at my clinic in [place].   2. During the last 10 years that I have followed her here in [place], we have dealt with several different problem areas:  A. I followed two aspects of her multinodular goiter, the nodules themselves and her thyroid hormone status.   1. A thyroid ultrasound performed on 10/26/06 showed a 1.5 cm dominant hyperechoic mass in the left upper pole. There were 2 smaller nodules in the right lobe measuring 6.9 and 7.6 mm in diameter. On 12/14/06 a fine needle aspirate was performed on the dominant nodule in the left upper lobe. The pathologists diagnosis was: "Rare benign follicular cells, abundant histiocytes and colloid. No neoplasms identified." A follow up thyroid ultrasound performed on 11/05/07 showed an echogenic nodule in the mid left lobe measuring 1.7 x 1.1 x 0.8 cm. This was not grossly changed from images obtained during the previous fine needle aspirate. There was also a smaller nodule in the left lobe measuring 1.3 x 0.6 cm.  In the inferior aspect of the right lobe there was a nodule measuring 1.1 x 0.7 x 0.5. Her next thyroid ultrasound performed on 01/14/2009 showed a partially calcified nodule in the lower pole of the right lobe measuring 1.8 x 0.6 x 0.8. By report, this nodule had increased somewhat in size. However, because  the images from the 2009 study were not available, no direct comparison could be made. The largest nodule in the left lobe measured 1.6 x 1.0 x 0.8., essentially unchanged from the previous study.   2. The patient remained euthyroid through the spring of 2010. In September of 2010 and again in April of 2011 she became mildly hypothyroid. It appeared that she was having some flare  ups of Hashimoto's disease at those times. Her TFTs subsequently normalized on 11/26/10.  B. Because of her known osteoporosis, I wanted to treat her with a bisphosphonate. Since she had previously not tolerated Actonel well, she was reluctant. I finally convinced her to try Boniva, but she later discontinued that medication as well. She was not at all interested in doing Forteo injections. To further try to minimize bone loss, I monitored her bone mineral parameters closely. Up through her visit on 04/17/07 her parameters were normal, but she had many difficulties in taking all of her calcium and vitamin D pills, in part due to constipation. Unfortunately, by 12/03/07 her 25-hydroxy vitamin D dropped to 24.2 and her 1,25- dihydroxy vitamin D dropped to 23.3. It appeared not only was she deficient in vitamin D intake, but her kidneys were also not optimally converting 25-hydroxy vitamin D to 1, 25-dihydroxy hydroxy vitamin D. I prescribed vitamin D, 50,000 IU per week. I also added calcitriol, 0.25 mcg per week. When she took these medications, her bone parameters normalized. In early 2010, however, she had stopped taking these medications. Her PTH rose to 75 and then to 81.8. When she again took her calcium and her two vitamin D preparations, her PTH decreased progressively to 62.1 and then to 33 on 11/26/10. On that date, her serum calcium was 10.2, her 25-hydroxy vitamin D was 32.2, and her 1, 25- dihydroxy vitamin D was 28.2. By then, however, she had stopped taking the vitamin D 50,000 international units weekly because her insurance company would not cover it. Her calcium levels, vitamin D levels, and PTH levels have fluctuated ever since.   C.In 2011 she developed stage IV mantle cell lymphoma of the tonsils, stomach, and digestive tract and was successfully treated with chemotherapy for two years. In late 2014 she developed cancer of the ascending colon and was successfully treated with surgery.    3. The  patient's last [facility] visit was on 12/10/14.   A. In the interim, she has been fairly healthy except for some flu-like illnesses.   B. She shows no signs of recurrence of her mantle cell lymphoma or her colon cancer. She is followed by oncology every 6 months.   C. Her energy is "not very good", but Joy Daniels says that she does a good job of taking care of her grandson 5 days per week and does well. She prefers not to exercise, in part because physical activities, such as shopping, tire her out.   D. She has had some seasonal allergies, but has not had many COPD symptoms. She has not needed to use either her inhaler or her nebulizer.    E. She still has peripheral neuropathy that cause her to have some balance problems.   F. She continues to have frequent urinary incontinence despite being on medication.   G. She is taking her calcitriol, .25, twice weekly; Citracal with D twice daily; omeprazole 40, once daily; metoprolol ER, 50 mg, once daily; B12, 100 mcg, once daily; KCl ER 19 mEq, twice daily; Losartan/Hctz, 100/25, 1/2 tab daily; Enablex, 7.5 mg, daily;  Welchol,  625, 3 packets, twice daily; Montelukast, 10 mg, once daily. She also takes Vitamin D, 50.000 IU weekly.   4. Pertinent Review of Systems:  Constitutional: The patient feels "sometimes pretty good". She still gets short of breath with exertion due to her COPD.  Her appetite is "not as good". Food tastes okay, but meat no longer tastes good.   Eyes: Vision is better since having bilateral cataractectomies. Her eye dryness has improved so she no longer takes the moisturizing drops. Her eyelids droop a lot.    Mouth: Mouth is still dry.    Neck: She has not had any thyroid pains or inflammation. She occasionally has left postero-lateral neck pains, as she has had off and on for years. Her range of motion of the cervical spine is much reduced. She no longer has difficulty swallowing.   Heart: Heart rate increases with physical activity. The  patient has no complaints of palpitations, irregular heat beats, chest pain, or chest pressure. Lungs: Her COPD is better, but she still gets short of breath even with minor exertion.  Gastrointestinal: She is occasionally constipated. Her diarrhea resolved. Her bloating has resolved. The patient has no complaints of excessive hunger, acid reflux, upset stomach, stomach aches or pains. Legs: She still has episodic calf cramps at night, 1-2 times per week. Calf cramps were reduced in the past if she stretched before going to bed, but she has not been dong the stretching for months. Muscle mass and strength seem normal. There are no complaints of numbness, tingling, or burning. She does not have much edema as long as she takes her HCTZ. She often has restless legs at night.  Feet: There are no obvious foot problems. There are no complaints of numbness, tingling, burning, or pain. She has occasional edema.  Neuro: Her balance is not as good.    PAST MEDICAL, FAMILY, AND SOCIAL HISTORY:  Past Medical History  Diagnosis Date  . Toxic nodular goiter   . Other osteoporosis   . GERD (gastroesophageal reflux disease)   . Iron deficiency   . Pallor   . Combined hyperlipidemia   . Hypercalcemia   . Nontoxic multinodular goiter   . Vitamin D deficiency disease   . Hyperparathyroidism , secondary, non-renal   . Thyroiditis, autoimmune   . Hypothyroidism, acquired, autoimmune   . COPD (chronic obstructive pulmonary disease)   . Dysgeusia   . Lymphoma, mantle cell, multiple sites   . Hypertension   . Obesity (BMI 30.0-34.9)     Family History  Problem Relation Age of Onset  . Diabetes Father   . Cancer Sister   . Heart disease Sister      Current outpatient prescriptions:  .  calcitRIOL (ROCALTROL) 0.25 MCG capsule, TAKE ONE CAPSULE TWICE WEEKLY., Disp: 25 capsule, Rfl: 3 .  colesevelam (WELCHOL) 625 MG tablet, Take by mouth 2 (two) times daily with a meal. 3 tablest bid, Disp: , Rfl:  .   ezetimibe (ZETIA) 10 MG tablet, Take 10 mg by mouth daily., Disp: , Rfl:  .  hydrochlorothiazide (MICROZIDE) 12.5 MG capsule, Take 12.5 mg by mouth daily., Disp: , Rfl:  .  losartan (COZAAR) 25 MG tablet, Take 25 mg by mouth daily., Disp: , Rfl:  .  montelukast (SINGULAIR) 10 MG tablet, Take 10 mg by mouth at bedtime., Disp: , Rfl:  .  omeprazole (PRILOSEC) 10 MG capsule, Take 10 mg by mouth daily.  , Disp: , Rfl:  .  potassium chloride (K-DUR)  10 MEQ tablet, Take 10 mEq by mouth 2 (two) times daily., Disp: , Rfl:   Allergies as of 12/22/2015 - Review Complete 12/10/2014  Allergen Reaction Noted  . Codeine  01/27/2011    1. Work and Family: Her family is supportive. Joy Daniels lives with her husband, daughter Joy Daniels, and two grandsons.    2. Activities: She does not do any regular physical activity, but tries to stay active with shopping, house work, and child care. 3. Smoking, alcohol, or drugs: Unknown 4. Primary Care Provider: Dr. Gae Bon B. Uppin  REVIEW OF SYSTEMS: There are no other significant problems involving Joy Daniels's other body systems.   Objective:  Vital Signs:  BP 139/77 mmHg  Pulse 82  Wt 168 lb 12.8 oz (76.567 kg)   Ht Readings from Last 3 Encounters:  10/12/10 5' (1.524 m)  02/11/10 5' (1.524 m)   Wt Readings from Last 3 Encounters:  12/22/15 168 lb 12.8 oz (76.567 kg)  12/10/14 168 lb (76.204 kg)  06/11/14 175 lb (79.379 kg)   PHYSICAL EXAM:  Constitutional: The patient really seems good today. She is alert, oriented, and quite lucid. Her sense of humor and affect are very normal. Her weight has been stable.   Face: Her face appears very lined and worn. She looks about 6 years older than her chronologic age.  Eyes: There is no obvious arcus or proptosis. Moisture appears normal. Mouth: The oropharynx and tongue appear normal. Oral moisture is normal. Neck: The neck appears to be visibly normal. No carotid bruits are noted. The thyroid gland is low-lying. The thyroid  gland is again at about 18-20 grams in size.  Both lobes are normal in size today. The consistency of the thyroid gland is normal. The thyroid gland is "touchy" in the left mid-lobe today. She has limited range of motion of her cervical spine. Her left trapezius muscle and left nuchal cord are tight, but not tender today.  Lungs: The lungs are clear to auscultation. Air movement is good. Heart: Heart rate and rhythm are regular. Heart sounds S1 and S2 are normal. I did not appreciate any pathologic cardiac murmurs. Abdomen: The abdomen is enlarged. Bowel sounds are normal. There is no obvious hepatomegaly, splenomegaly, or other mass effect.  Arms: Muscle size and bulk are normal for age. Hands: There is no obvious tremor. Phalangeal and metacarpophalangeal joints are normal. Palmar muscles are normal. Palmar skin is normal. Palmar moisture is also normal. Her nail pallor has resolved.  Legs: Muscles appear normal for age. She has trace edema in the legs today. Her shins are tender to palpation. Neurologic: Strength is normal for age in both the upper and lower extremities. Muscle tone is normal. Sensation to touch is normal in both legs, but decreased in both heels.    LAB DATA:   Labs: 11/18/14: 25-0H vitamin D 32.9, calcium 10.1; ALT 42; cholesterol 237, triglycerides 290, HDL 46, LDL 133  Labs 08/14/14: 25-OH vitamin D 31.9, calcium 10.6; ALT 33; TSH 3.47   Labs 11/05/13: TSH 2.902, free T4 1.06, free T3 2.5; 1,25-dihydroxy vitamin D 39; 25-hydroxy vitamin D 47, PTH 72, calcium 10.1  Labs 06/11/13: 1,25-dihydroxy vitamin D 39, 25-hydroxy vitamin D 47, PTH 72, calcium 10.1 ; TSH 2.901, free T4 1.06, free T3  2.5  Labs 12/10/12: Hgb 11.1, Hct 33.6%; TSH 2.964, free T4 1.06, free T3 2.4; 25-hydroxy vitamin D 37, PTH 89.4, calcium 9.9; CMP normal  Labs 06/11/12: CMP was normal, to include a calcium of 9.9;  TSH 2.195,  free T4 0.98, free T3 2.6; PTH 102.3, 25-hydroxy vitamin D 29, 1,25-dihydroxy  vitamin D 57.  Labs 11/17/11: PTH 70.9,calcium 10.0, 25-hydroxy vitamin D 40, 1,25-dihydroxy vitamin D 67, TSH 2.312. Free T4  0.97, Free T3 2.4  Labs 05/19/11: PTH 64, calcium 10.4, 25-hydroxy vitamin D 36, 1,25-dihydroxy vitamin D 47, TSH 2.497, free T4 1.04, free T3 2.6   Assessment and Plan:   ASSESSMENT:  1. Obesity: The patient has maintained her weight in the past year.  2. Vitamin D deficiency: Patient's 25-hydroxy vitamin D level was in the low normal range in December 2015 and March 2016.  Her last 1,25-dihydroxy vitamin D level in February 2015 was normal, but had decreased in the past two years. We need to repeat her labs now.   3. Thyroiditis/goiter: Her Hashimoto's disease is clinically active today, but her thyroid gland is again normal in size.  The episodic "tenderness" and the process of waxing and waning of thyroid gland size are c/w thyroiditis.  She was euthyroid in March 2014 and again in February 2015, but at the lower end of the normal range. In December 2015 she was borderline hypothyroid.  We need to repeat her labs now. 4. Dyspepsia: She is doing better on omeprazole. 5. Hyperparathyroidism, secondary to calcium and vitamin D intake deficiency: Her PTH was lower two years ago, much lower than in September 2013. We need to repeat her labs now.  6. Dysgeusia: Her dysgeusia improved after resuming her Centrum Silver, but seems to have worsened since last visit. She may benefit from more vitamin B6.  8. Fatigue: Patient's fatigue is better, but is still a problem for her.  9. Lymphoma: Her lymphoma is in remission. I'm glad. 10. Colon cancer: She is in remission, which is wonderful.  11. Shin pains: This problem has recurred and could indicate worsening hyperparathyroidism..   12. Calf cramps and restless legs:  I again showed her the hurdler's stretching exercise to be done for at least two minutes before going to bed.  13. Trapezius spasm and tight nuchal cord: She likely  has some degenerative arthritis of the cervical spine, causing pinching of her left cervical nerves. I asked her to resume her cervical stretching exercises that she can do several times per day.  14. Combined hyperlipidemia: Dr. Rica Records is following this problem for her.   PLAN:  1. Diagnostic: Repeat TFTs, calcium, PTH. 25-OH vitamin D, and 1,25-dihydroxy vitamin D. Repeat labs, except for the 1,25-dihydroxy vitamin D, in 6 months after her visit.  2. Therapeutic: Continue calcitriol at one tablet/capsule, twice weekly. Please take all of her medications, to include her calcitriol, calcium, Welchol, and vitamin D. Continue to take a multivitamin pill daily. Add one vitamin B6 capsule daily 3. 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 We need to keep up her intake of calcium and vitamin D. 4. Follow-up: 6 months  Level of Service: This visit lasted in excess of 55 minutes. More than 50% of the visit was devoted to counseling.  Sherrlyn Hock, MD 12/22/2015 3:58 PM

## 2015-12-22 NOTE — Patient Instructions (Signed)
Follow up visit in 6 months. 

## 2015-12-23 LAB — VITAMIN D 25 HYDROXY (VIT D DEFICIENCY, FRACTURES): VIT D 25 HYDROXY: 47 ng/mL (ref 30–100)

## 2015-12-23 LAB — PTH, INTACT AND CALCIUM
CALCIUM: 10 mg/dL (ref 8.4–10.5)
PTH: 79 pg/mL — ABNORMAL HIGH (ref 14–64)

## 2015-12-24 ENCOUNTER — Encounter: Payer: Self-pay | Admitting: "Endocrinology

## 2015-12-25 LAB — VITAMIN D 1,25 DIHYDROXY
VITAMIN D 1, 25 (OH) TOTAL: 40 pg/mL (ref 18–72)
VITAMIN D2 1, 25 (OH): 16 pg/mL
Vitamin D3 1, 25 (OH)2: 24 pg/mL

## 2016-01-11 ENCOUNTER — Other Ambulatory Visit: Payer: Self-pay | Admitting: "Endocrinology

## 2016-01-12 ENCOUNTER — Other Ambulatory Visit: Payer: Self-pay | Admitting: "Endocrinology

## 2016-03-07 DIAGNOSIS — K219 Gastro-esophageal reflux disease without esophagitis: Secondary | ICD-10-CM | POA: Diagnosis not present

## 2016-03-07 DIAGNOSIS — E559 Vitamin D deficiency, unspecified: Secondary | ICD-10-CM | POA: Diagnosis not present

## 2016-03-07 DIAGNOSIS — R945 Abnormal results of liver function studies: Secondary | ICD-10-CM | POA: Diagnosis not present

## 2016-03-07 DIAGNOSIS — J309 Allergic rhinitis, unspecified: Secondary | ICD-10-CM | POA: Diagnosis not present

## 2016-03-07 DIAGNOSIS — E669 Obesity, unspecified: Secondary | ICD-10-CM | POA: Diagnosis not present

## 2016-03-07 DIAGNOSIS — E785 Hyperlipidemia, unspecified: Secondary | ICD-10-CM | POA: Diagnosis not present

## 2016-03-07 DIAGNOSIS — R32 Unspecified urinary incontinence: Secondary | ICD-10-CM | POA: Diagnosis not present

## 2016-03-07 DIAGNOSIS — E876 Hypokalemia: Secondary | ICD-10-CM | POA: Diagnosis not present

## 2016-03-07 DIAGNOSIS — I10 Essential (primary) hypertension: Secondary | ICD-10-CM | POA: Diagnosis not present

## 2016-03-07 DIAGNOSIS — H542 Low vision, both eyes: Secondary | ICD-10-CM | POA: Diagnosis not present

## 2016-03-09 DIAGNOSIS — J309 Allergic rhinitis, unspecified: Secondary | ICD-10-CM | POA: Diagnosis not present

## 2016-03-09 DIAGNOSIS — E559 Vitamin D deficiency, unspecified: Secondary | ICD-10-CM | POA: Diagnosis not present

## 2016-03-09 DIAGNOSIS — I10 Essential (primary) hypertension: Secondary | ICD-10-CM | POA: Diagnosis not present

## 2016-03-09 DIAGNOSIS — N3281 Overactive bladder: Secondary | ICD-10-CM | POA: Diagnosis not present

## 2016-03-09 DIAGNOSIS — M159 Polyosteoarthritis, unspecified: Secondary | ICD-10-CM | POA: Diagnosis not present

## 2016-03-09 DIAGNOSIS — J439 Emphysema, unspecified: Secondary | ICD-10-CM | POA: Diagnosis not present

## 2016-03-09 DIAGNOSIS — E785 Hyperlipidemia, unspecified: Secondary | ICD-10-CM | POA: Diagnosis not present

## 2016-03-09 DIAGNOSIS — E669 Obesity, unspecified: Secondary | ICD-10-CM | POA: Diagnosis not present

## 2016-03-09 DIAGNOSIS — Z6832 Body mass index (BMI) 32.0-32.9, adult: Secondary | ICD-10-CM | POA: Diagnosis not present

## 2016-03-18 DIAGNOSIS — R978 Other abnormal tumor markers: Secondary | ICD-10-CM | POA: Diagnosis not present

## 2016-03-18 DIAGNOSIS — C8319 Mantle cell lymphoma, extranodal and solid organ sites: Secondary | ICD-10-CM | POA: Diagnosis not present

## 2016-03-21 DIAGNOSIS — C8319 Mantle cell lymphoma, extranodal and solid organ sites: Secondary | ICD-10-CM | POA: Diagnosis not present

## 2016-03-21 DIAGNOSIS — Z85038 Personal history of other malignant neoplasm of large intestine: Secondary | ICD-10-CM | POA: Diagnosis not present

## 2016-03-21 DIAGNOSIS — C8318 Mantle cell lymphoma, lymph nodes of multiple sites: Secondary | ICD-10-CM | POA: Diagnosis not present

## 2016-03-21 DIAGNOSIS — C182 Malignant neoplasm of ascending colon: Secondary | ICD-10-CM | POA: Diagnosis not present

## 2016-03-21 DIAGNOSIS — R748 Abnormal levels of other serum enzymes: Secondary | ICD-10-CM | POA: Diagnosis not present

## 2016-03-28 DIAGNOSIS — C8310 Mantle cell lymphoma, unspecified site: Secondary | ICD-10-CM | POA: Diagnosis not present

## 2016-03-28 DIAGNOSIS — R935 Abnormal findings on diagnostic imaging of other abdominal regions, including retroperitoneum: Secondary | ICD-10-CM | POA: Diagnosis not present

## 2016-03-28 DIAGNOSIS — C8319 Mantle cell lymphoma, extranodal and solid organ sites: Secondary | ICD-10-CM | POA: Diagnosis not present

## 2016-03-28 DIAGNOSIS — C182 Malignant neoplasm of ascending colon: Secondary | ICD-10-CM | POA: Diagnosis not present

## 2016-06-22 ENCOUNTER — Ambulatory Visit: Payer: Medicare PPO | Admitting: "Endocrinology

## 2016-06-24 ENCOUNTER — Ambulatory Visit (INDEPENDENT_AMBULATORY_CARE_PROVIDER_SITE_OTHER): Payer: Medicare PPO | Admitting: "Endocrinology

## 2016-06-24 VITALS — BP 131/75 | HR 84 | Wt 160.8 lb

## 2016-06-24 DIAGNOSIS — E213 Hyperparathyroidism, unspecified: Secondary | ICD-10-CM | POA: Diagnosis not present

## 2016-06-24 DIAGNOSIS — E063 Autoimmune thyroiditis: Secondary | ICD-10-CM | POA: Diagnosis not present

## 2016-06-24 DIAGNOSIS — E6609 Other obesity due to excess calories: Secondary | ICD-10-CM

## 2016-06-24 DIAGNOSIS — R1013 Epigastric pain: Secondary | ICD-10-CM

## 2016-06-24 DIAGNOSIS — R432 Parageusia: Secondary | ICD-10-CM | POA: Diagnosis not present

## 2016-06-24 DIAGNOSIS — R5383 Other fatigue: Secondary | ICD-10-CM

## 2016-06-24 DIAGNOSIS — E559 Vitamin D deficiency, unspecified: Secondary | ICD-10-CM

## 2016-06-24 DIAGNOSIS — S8991XA Unspecified injury of right lower leg, initial encounter: Secondary | ICD-10-CM | POA: Diagnosis not present

## 2016-06-24 DIAGNOSIS — E049 Nontoxic goiter, unspecified: Secondary | ICD-10-CM | POA: Diagnosis not present

## 2016-06-24 LAB — COMPREHENSIVE METABOLIC PANEL
ALK PHOS: 73 U/L (ref 33–130)
ALT: 40 U/L — AB (ref 6–29)
AST: 33 U/L (ref 10–35)
Albumin: 4.2 g/dL (ref 3.6–5.1)
BILIRUBIN TOTAL: 1.6 mg/dL — AB (ref 0.2–1.2)
BUN: 16 mg/dL (ref 7–25)
CO2: 29 mmol/L (ref 20–31)
CREATININE: 0.98 mg/dL — AB (ref 0.60–0.93)
Calcium: 10.3 mg/dL (ref 8.6–10.4)
Chloride: 102 mmol/L (ref 98–110)
GLUCOSE: 89 mg/dL (ref 70–99)
Potassium: 4.1 mmol/L (ref 3.5–5.3)
SODIUM: 140 mmol/L (ref 135–146)
Total Protein: 7.3 g/dL (ref 6.1–8.1)

## 2016-06-24 LAB — TSH: TSH: 2.36 mIU/L

## 2016-06-24 LAB — T4, FREE: Free T4: 0.9 ng/dL (ref 0.8–1.8)

## 2016-06-24 LAB — T3, FREE: T3, Free: 2.5 pg/mL (ref 2.3–4.2)

## 2016-06-24 NOTE — Progress Notes (Addendum)
Subjective:  Patient Name: Joy Daniels Date of Birth: 04/14/1941  MRN: [id number]  Deboria Apley  presents to the office today for follow-up of her secondary hyperparathyroidism, hypocalcemia, vitamin D deficiency, iron deficiency anemia, dysgeusia, dyspepsia, intermittent abnormal TFTs/hypothyroidism, thyroiditis, nontoxic multinodular goiter, s/p colon cancer, and s/p mantle cell lymphoma.  HISTORY OF PRESENT ILLNESS:   Joy Daniels is a 75 y.o. Caucasian woman.  Joy Daniels was accompanied by her sister, Joy Daniels.   1. The patient was first referred to me on 02/01/05 by her primary care physician, Dr. Micheal Likens, for follow up of toxic nodular goiter. The patient was then 75 years of age.   A.  The patient had been previously evaluated by Dr. Genelle Bal, an adult endocrinologist based in [place]. Dr. London Pepper was performing an outreach clinic at the [facility] in [place], [place]. He evaluated the patient for hyperthyroidism and found that she had a toxic nodular goiter. He arranged for her to have radioactive iodine treatment on 12/13/04 with 24.5 mCi of I-131. When Dr. London Pepper retired, I was asked to handle the outreach clinic for the next several months. At the patient's first visit with me, many of her hyperthyroid symptoms and signs had significantly improved. Past medical history was positive for hypertension, osteoporosis, hip pains, dyspepsia, and GERD. She had a previous injury to her right hand that required surgical repair. Family history was positive for cancer in one sister and a heart attack in another sister. Her brother had diabetes and had a stroke. On physical examination she was hypertensive. She had a 25+ gram goiter. She also had pallor of the nailbeds of her fingers.   B. At her next follow up visit in [place] on 03/29/05, she felt much better. Her energy was much improved. Muscle strength was improving. She was still significantly bothered by her excessive belly hunger that was causing weight gain.  TFTs from 03/22/05 were normal, with a TSH of 1.11, a free T4 of 1.22, and a free T3 of 2.8. I started her on Nexium 40 mg per day. Since I was getting ready to terminate the outreach clinic in [place], I made arrangements for her to be followed at my clinic in [place].   2. During the last 11 years that I have followed her here in [place], we have dealt with several different problems:  A. I followed two aspects of her multinodular goiter, the nodules themselves and her thyroid hormone status.   1. A thyroid ultrasound performed on 10/26/06 showed a 1.5 cm dominant hyperechoic mass in the left upper pole. There were 2 smaller nodules in the right lobe measuring 6.9 and 7.6 mm in diameter. On 12/14/06 a fine needle aspirate was performed on the dominant nodule in the left upper lobe. The pathologists diagnosis was: "Rare benign follicular cells, abundant histiocytes and colloid. No neoplasms identified." A follow up thyroid ultrasound performed on 11/05/07 showed an echogenic nodule in the mid left lobe measuring 1.7 x 1.1 x 0.8 cm. This was not grossly changed from images obtained during the previous fine needle aspirate. There was also a smaller nodule in the left lobe measuring 1.3 x 0.6 cm.  In the inferior aspect of the right lobe there was a nodule measuring 1.1 x 0.7 x 0.5. Her next thyroid ultrasound performed on 01/14/2009 showed a partially calcified nodule in the lower pole of the right lobe measuring 1.8 x 0.6 x 0.8. By report, this nodule had increased somewhat in size. However, because the images from the 2009  study were not available, no direct comparison could be made. The largest nodule in the left lobe measured 1.6 x 1.0 x 0.8., essentially unchanged from the previous study. Since her clinical exam has been stable since 2010 I have not repeated the thyroid US.   2. The patient remained euthyroid through the spring of 2010. In September of 2010 and again in April of 2011 she became mildly  hypothyroid. It appeared that she was having some flare ups of Hashimoto's disease at those times. Her TFTs subsequently normalized on 11/26/10.  B. Because of her known osteoporosis, I wanted to treat her with a bisphosphonate. Since she had previously not tolerated Actonel well, she was reluctant. I finally convinced her to try Boniva, but she later discontinued that medication as well. She was not at all interested in doing Forteo injections. To further try to minimize bone loss, I monitored her bone mineral parameters closely. Up through her visit on 04/17/07 her parameters were normal, but she had many difficulties in taking all of her calcium and vitamin D pills, in part due to constipation. Unfortunately, by 12/03/07 her 25-hydroxy vitamin D dropped to 24.2 and her 1,25- dihydroxy vitamin D dropped to 23.3. It appeared not only was she deficient in vitamin D intake, but her kidneys were also not optimally converting 25-hydroxy vitamin D to 1, 25-dihydroxy hydroxy vitamin D. I prescribed vitamin D, 50,000 IU per week. I also added calcitriol, 0.25 mcg per week. When she took these medications, her bone parameters normalized. In early 2010, however, she had stopped taking these medications. Her PTH rose to 75 and then to 81.8. When she again took her calcium and her two vitamin D preparations, her PTH decreased progressively to 62.1 and then to 33 on 11/26/10. On that date, her serum calcium was 10.2, her 25-hydroxy vitamin D was 32.2, and her 1, 25- dihydroxy vitamin D was 28.2. By then, however, she had stopped taking the vitamin D 50,000 international units weekly because her insurance company would not cover it. Her calcium levels, vitamin D levels, and PTH levels have fluctuated ever since.   C.In 2011 she developed stage IV mantle cell lymphoma of the tonsils, stomach, and digestive tract and was successfully treated with chemotherapy for two years. In late 2014 she developed cancer of the ascending colon  and was successfully treated with surgery.    3. The patient's last [facility] visit was on 12/22/15.   A. In the interim, she has been fairly healthy. However, about two weeks ago she scraped the front of her right shin on the doorframe of a Hummer. The area became hot and very swollen. She used ice packs and the pain and swelling have gradually and progressively improved, but not yet fully resolved.   B. She shows no signs of recurrence of her mantle cell lymphoma or her colon cancer. She is followed by oncology every 6 months.   C. Her energy is "not very good", but she still takes care of two great grandchildren three times per week. She prefers not to exercise, in part because physical activities, such as shopping, tire her out.   D. She has had some seasonal allergies, but has not had many COPD symptoms. She has not needed to use either her inhaler or her nebulizer.    E. She still has peripheral neuropathy that cause her to have some balance problems.   F. She continues to have frequent urinary incontinence despite being on medication.   G. She  is taking her calcitriol, 0.25 mcg, twice weekly; Citracal with D twice daily; omeprazole 40, once daily; metoprolol ER, 50 mg, once daily; B12, 100 mcg, once daily; KCl ER 10 mEq, twice daily; Losartan/Hctz, 100/25, 1/2 tab daily; Enablex, 7.5 mg, daily; Welchol,  625, 3 packets, twice daily; Montelukast, 10 mg, once daily. She also takes Vitamin D, 50,000 IU weekly.   4. Pertinent Review of Systems:  Constitutional: The patient feels "fine except for her head feeling full today due to her allergies". She still gets short of breath with exertion due to her COPD.  Her appetite is "not too good"." I can eat a big meal and it lasts me all day."  "There is nothing special that I want to eat except for breakfast."  She has been trying to lose fat weight and so has been eating more carefully.  Eyes: Vision is better since having bilateral cataractectomies. Her eye  dryness is still occasionally a problem, so she sometimes has to use her moisturizing drops. Her eyelids still droop, but less.    Mouth: Mouth is still dry.    Neck: She has not had any thyroid pains or inflammation. She occasionally has left postero-lateral neck pains, as she has had off and on for years. Her range of motion of the cervical spine is much reduced. She no longer has difficulty swallowing.   Heart: Heart rate "stays OK". The patient has no complaints of palpitations, irregular heat beats, chest pain, or chest pressure. Lungs: Her COPD is better, but she still gets short of breath with minor exertion.  Gastrointestinal: She is occasionally constipated. Her diarrhea resolved. Her bloating has resolved. The patient has no complaints of excessive hunger, acid reflux, upset stomach, stomach aches or pains. Legs: Right shin as above. She has not had many calf cramps at night since her last visit. Calf cramps were reduced in the past if she stretched before going to bed, but she has not needed to do the stretching for months. Muscle mass and strength seem normal. There are no complaints of numbness, tingling, or burning. She does not have much edema as long as she takes her HCTZ. Her legs are often cold.  Feet: There are no obvious foot problems. There are no complaints of numbness, tingling, burning, or pain. Her feet are often cold. She has occasional edema.  Neuro: Her balance is not as good.    PAST MEDICAL, FAMILY, AND SOCIAL HISTORY:  Past Medical History:  Diagnosis Date  . Combined hyperlipidemia   . COPD (chronic obstructive pulmonary disease) ([place])   . Dysgeusia   . GERD (gastroesophageal reflux disease)   . Hypercalcemia   . Hyperparathyroidism , secondary, non-renal ([place])   . Hypertension   . Hypothyroidism, acquired, autoimmune   . Iron deficiency   . Lymphoma, mantle cell, multiple sites ([place])   . Nontoxic multinodular goiter   . Obesity (BMI 30.0-34.9)   . Other  osteoporosis   . Pallor   . Thyroiditis, autoimmune   . Toxic nodular goiter   . Vitamin D deficiency disease     Family History  Problem Relation Age of Onset  . Diabetes Father   . Cancer Sister   . Heart disease Sister      Current Outpatient Prescriptions:  .  calcitRIOL (ROCALTROL) 0.25 MCG capsule, TAKE ONE CAPSULE TWICE WEEKLY., Disp: 25 capsule, Rfl: 3 .  colesevelam (WELCHOL) 625 MG tablet, Take by mouth 2 (two) times daily with a meal. 3 tablest bid,  Disp: , Rfl:  .  ezetimibe (ZETIA) 10 MG tablet, Take 10 mg by mouth daily., Disp: , Rfl:  .  hydrochlorothiazide (MICROZIDE) 12.5 MG capsule, Take 12.5 mg by mouth daily., Disp: , Rfl:  .  losartan (COZAAR) 25 MG tablet, Take 25 mg by mouth daily., Disp: , Rfl:  .  montelukast (SINGULAIR) 10 MG tablet, Take 10 mg by mouth at bedtime., Disp: , Rfl:  .  omeprazole (PRILOSEC) 10 MG capsule, Take 10 mg by mouth daily.  , Disp: , Rfl:  .  potassium chloride (K-DUR) 10 MEQ tablet, Take 10 mEq by mouth 2 (two) times daily., Disp: , Rfl:   Allergies as of 06/24/2016 - Review Complete 06/24/2016  Allergen Reaction Noted  . Codeine  01/27/2011    1. Work and Family: Her family is supportive. Lilibet lives with her husband, daughter [name], and three adult grandsons.    2. Activities: She does not do any regular physical activity, but stays active with shopping, house work, and child care. 3. Smoking, alcohol, or drugs: None 4. Primary Care Provider: Dr. Gae Bon B. Uppin  REVIEW OF SYSTEMS: There are no other significant problems involving Joy Daniels's other body systems.   Objective:  Vital Signs:  BP 131/75   Pulse 84   Wt 160 lb 12.8 oz (72.9 kg)   BMI 31.40 kg/m    Ht Readings from Last 3 Encounters:  10/12/10 5' (1.524 m)  02/11/10 5' (1.524 m)   Wt Readings from Last 3 Encounters:  06/24/16 160 lb 12.8 oz (72.9 kg)  12/22/15 168 lb 12.8 oz (76.6 kg)  12/10/14 168 lb (76.2 kg)   PHYSICAL EXAM:  Constitutional: The  patient really seems good today. She has lost 8 pounds since her last visit. She is alert, oriented, and quite lucid. Her sense of humor and affect are very normal.  Face: Her face appears very lined and worn. She looks about 34 years older than her chronologic age.  Eyes: There is no obvious arcus or proptosis. Moisture appears normal. She has a slight droop of her left upper eyelid, but much less than before.  Mouth: The oropharynx and tongue appear normal. Oral moisture is somewhat low. Neck: The neck appears to be visibly normal. No carotid bruits are noted. The thyroid gland is low-lying. The thyroid gland is again at about 18-20 grams in size.  Both lobes are normal in size today. The consistency of the thyroid gland is normal. The thyroid gland is not tender to palpation today. She has limited range of motion of her cervical spine. Her left trapezius muscle and left nuchal cord are tight, but not tender today.  Lungs: The lungs are clear to auscultation. Air movement is good. Heart: Heart rate and rhythm are regular. Heart sounds S1 and S2 are normal. I did not appreciate any pathologic cardiac murmurs. Abdomen: The abdomen is enlarged. Bowel sounds are normal. There is no obvious hepatomegaly, splenomegaly, or other mass effect.  Arms: Muscle size and bulk are normal for age. Hands: There is no obvious tremor. Phalangeal and metacarpophalangeal joints are normal. Palmar muscles are normal. Palmar skin is normal. Palmar moisture is also normal. Her nail pallor has resolved.  Legs: Muscles appear normal for age. She has trace edema in the legs today. Her right shin has an ovoid, poorly demarcated area of increased soft tissue swelling and mild, patchy erythema about 4 cm wide at the widest point and about 10 cm in length vertically. The area is  somewhat tender and warm in spots. No active infection is evident. No proximal redness is present. No posterior shin tenderness is present.  Neurologic:  Strength is fairly normal for age in both the upper and lower extremities. Muscle tone is normal. Sensation to touch is normal in both legs and the dorsa of both feet.     LAB DATA:   Labs 12/22/15: TSH 2.66, free T4 1.2, free T3 2.4; CMP normal except for AST 39 and ALT 48; PTH 79 (ref 14-64), calcium 10.0,  25-OH vitamin D 47, 1,25-dihydroxy vitamin d 40 (ref 18.72)  Labs: 11/18/14: 25-OH vitamin D 32.9, calcium 10.1; ALT 42; cholesterol 237, triglycerides 290, HDL 46, LDL 133  Labs 08/14/14: 25-OH vitamin D 31.9, calcium 10.6; ALT 33; TSH 3.47   Labs 11/05/13: TSH 2.902, free T4 1.06, free T3 2.5; 1,25-dihydroxy vitamin D 39; 25-hydroxy vitamin D 47, PTH 72, calcium 10.1  Labs 06/11/13: 1,25-dihydroxy vitamin D 39, 25-hydroxy vitamin D 47, PTH 72, calcium 10.1 ; TSH 2.901, free T4 1.06, free T3  2.5  Labs 12/10/12: Hgb 11.1, Hct 33.6%; TSH 2.964, free T4 1.06, free T3 2.4; 25-hydroxy vitamin D 37, PTH 89.4, calcium 9.9; CMP normal  Labs 06/11/12: CMP was normal, to include a calcium of 9.9; TSH 2.195,  free T4 0.98, free T3 2.6; PTH 102.3, 25-hydroxy vitamin D 29, 1,25-dihydroxy vitamin D 57.  Labs 11/17/11: PTH 70.9,calcium 10.0, 25-hydroxy vitamin D 40, 1,25-dihydroxy vitamin D 67, TSH 2.312. Free T4  0.97, Free T3 2.4  Labs 05/19/11: PTH 64, calcium 10.4, 25-hydroxy vitamin D 36, 1,25-dihydroxy vitamin D 47, TSH 2.497, free T4 1.04, free T3 2.6   Assessment and Plan:   ASSESSMENT:  1. Obesity: The patient has lost some weight in the past 6 months, partly due to not being as interested in food, but also due to consciously trying to eat more carefully.  It is important to maintain her protein intake.  2. Vitamin D deficiency: Patient's 25-hydroxy vitamin D level in April was normal. Her 1,25-dihydroxy vitamin D level in April was also normal. The combination of oral vitamin D and oral calcitriol is working successfully for her.  3. Thyroiditis/goiter: Her Hashimoto's disease was clinically  active at her last visit, but is quiescent today. Her thyroid gland is again within normal for size today. The episodic "tenderness" and the process of waxing and waning of thyroid gland size are c/w thyroiditis.  She was euthyroid again in April.  4. Dyspepsia: She is doing better on omeprazole. 5. Hyperparathyroidism, secondary to calcium and vitamin D intake deficiency: Her PTH has been mildly elevated for several years, but her calciums have been in the upper quartile of the normal range. We need to repeat her labs now.  6. Dysgeusia: Her dysgeusia improved after resuming her Centrum Silver. 7. Fatigue: Patient's fatigue is still present, but the amount of subjective fatigue that she has does not prevent her from being a very active wife, mother, grandmother, and great grandmother.  8. Lymphoma: Her lymphoma is in remission, which is wonderful.  9. Colon cancer: She is in remission, which is also wonderful.  11. Shin inflammation: She abraded and likely bruised the right shin two weeks ago. Fortunately the area is slowly, but progressively improving and does not look infected. She needs to protect the area from inadvertent subsequent trauma.  12. Calf cramps and restless legs:  These problems have been fairly mild recently.   13. Trapezius spasm and tight nuchal cord: She likely  has some degenerative arthritis of the cervical spine, causing pinching of her left cervical nerves. I asked her to resume her cervical stretching exercises that she can do several times per day.  14. Combined hyperlipidemia: Dr. Rica Records is following this problem for her.   PLAN:  1. Diagnostic: Repeat TFTs, calcium, PTH, 25-OH vitamin D. Repeat labs, to include the 1,25-dihydroxy vitamin D in 6 months at the time of her next visit. Consider BMD. Consider repeating her thyroid US if her exam changes. 2. Therapeutic: Continue calcitriol at one tablet/capsule, twice weekly. Please take all of her medications, to include her  calcitriol, calcium, Welchol, and vitamin D. Continue to take a multivitamin pill and one vitamin B6 capsule daily 3. 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 We need to keep up her intake of calcium and vitamin D. 4. Follow-up: 6 months  Level of Service: This visit lasted in excess of 70 minutes. More than 50% of the visit was devoted to counseling.  Sherrlyn Hock, MD 06/24/2016 10:36 AM

## 2016-06-24 NOTE — Patient Instructions (Signed)
Follow up visit in 6 months. 

## 2016-06-25 LAB — VITAMIN D 25 HYDROXY (VIT D DEFICIENCY, FRACTURES): VIT D 25 HYDROXY: 42 ng/mL (ref 30–100)

## 2016-06-26 ENCOUNTER — Encounter (INDEPENDENT_AMBULATORY_CARE_PROVIDER_SITE_OTHER): Payer: Self-pay | Admitting: "Endocrinology

## 2016-07-28 DIAGNOSIS — J309 Allergic rhinitis, unspecified: Secondary | ICD-10-CM | POA: Diagnosis not present

## 2016-07-28 DIAGNOSIS — E559 Vitamin D deficiency, unspecified: Secondary | ICD-10-CM | POA: Diagnosis not present

## 2016-07-28 DIAGNOSIS — I10 Essential (primary) hypertension: Secondary | ICD-10-CM | POA: Diagnosis not present

## 2016-07-28 DIAGNOSIS — Z79899 Other long term (current) drug therapy: Secondary | ICD-10-CM | POA: Diagnosis not present

## 2016-07-28 DIAGNOSIS — Z23 Encounter for immunization: Secondary | ICD-10-CM | POA: Diagnosis not present

## 2016-07-28 DIAGNOSIS — E785 Hyperlipidemia, unspecified: Secondary | ICD-10-CM | POA: Diagnosis not present

## 2016-07-28 DIAGNOSIS — M159 Polyosteoarthritis, unspecified: Secondary | ICD-10-CM | POA: Diagnosis not present

## 2016-07-28 DIAGNOSIS — Z9181 History of falling: Secondary | ICD-10-CM | POA: Diagnosis not present

## 2016-07-28 DIAGNOSIS — Z1389 Encounter for screening for other disorder: Secondary | ICD-10-CM | POA: Diagnosis not present

## 2016-07-28 DIAGNOSIS — N3281 Overactive bladder: Secondary | ICD-10-CM | POA: Diagnosis not present

## 2016-08-03 DIAGNOSIS — R1011 Right upper quadrant pain: Secondary | ICD-10-CM | POA: Diagnosis not present

## 2016-08-19 DIAGNOSIS — M8589 Other specified disorders of bone density and structure, multiple sites: Secondary | ICD-10-CM | POA: Diagnosis not present

## 2016-08-19 DIAGNOSIS — M810 Age-related osteoporosis without current pathological fracture: Secondary | ICD-10-CM | POA: Diagnosis not present

## 2016-08-25 DIAGNOSIS — R7989 Other specified abnormal findings of blood chemistry: Secondary | ICD-10-CM | POA: Diagnosis not present

## 2016-08-25 DIAGNOSIS — Z6831 Body mass index (BMI) 31.0-31.9, adult: Secondary | ICD-10-CM | POA: Diagnosis not present

## 2016-08-25 DIAGNOSIS — R194 Change in bowel habit: Secondary | ICD-10-CM | POA: Diagnosis not present

## 2016-08-25 DIAGNOSIS — J309 Allergic rhinitis, unspecified: Secondary | ICD-10-CM | POA: Diagnosis not present

## 2016-08-25 DIAGNOSIS — I10 Essential (primary) hypertension: Secondary | ICD-10-CM | POA: Diagnosis not present

## 2016-08-25 DIAGNOSIS — R197 Diarrhea, unspecified: Secondary | ICD-10-CM | POA: Diagnosis not present

## 2016-08-25 DIAGNOSIS — K219 Gastro-esophageal reflux disease without esophagitis: Secondary | ICD-10-CM | POA: Diagnosis not present

## 2016-08-25 DIAGNOSIS — R109 Unspecified abdominal pain: Secondary | ICD-10-CM | POA: Diagnosis not present

## 2016-09-09 ENCOUNTER — Telehealth (INDEPENDENT_AMBULATORY_CARE_PROVIDER_SITE_OTHER): Payer: Self-pay | Admitting: "Endocrinology

## 2016-09-09 NOTE — Telephone Encounter (Signed)
1. I called the patient to give her the results of her recent BMD and her previous lab tests.  A. Her 25-OH vitamin D value of 42 was good.   B. Her calcium value of 10.3 was good.  C, Her TFTs were normal.  D. Her BMD showed net gains in BMD of her A-P spine, but net decreases in BMD in her left femur neck. 2. It is important to continue her intake of calcium and vitamin D as she is doing now.  3. Will see her in follow up in April 2018 as planned. Will repeat labs at that visit, to include a PTH. Tillman Sers, MD, CDE

## 2016-09-27 DIAGNOSIS — C8319 Mantle cell lymphoma, extranodal and solid organ sites: Secondary | ICD-10-CM | POA: Diagnosis not present

## 2016-09-27 DIAGNOSIS — R978 Other abnormal tumor markers: Secondary | ICD-10-CM | POA: Diagnosis not present

## 2016-10-04 ENCOUNTER — Other Ambulatory Visit (HOSPITAL_COMMUNITY)
Admission: RE | Admit: 2016-10-04 | Disposition: A | Discharge status: Home / Self Care | Payer: Medicare PPO | Source: Ambulatory Visit | Attending: Oncology | Admitting: Oncology

## 2016-10-04 DIAGNOSIS — Z8572 Personal history of non-Hodgkin lymphomas: Secondary | ICD-10-CM | POA: Diagnosis not present

## 2016-10-04 DIAGNOSIS — R197 Diarrhea, unspecified: Secondary | ICD-10-CM | POA: Diagnosis not present

## 2016-10-04 DIAGNOSIS — R7989 Other specified abnormal findings of blood chemistry: Secondary | ICD-10-CM | POA: Diagnosis not present

## 2016-10-04 DIAGNOSIS — C8319 Mantle cell lymphoma, extranodal and solid organ sites: Secondary | ICD-10-CM | POA: Diagnosis not present

## 2016-10-04 DIAGNOSIS — Z85038 Personal history of other malignant neoplasm of large intestine: Secondary | ICD-10-CM | POA: Diagnosis not present

## 2016-10-04 DIAGNOSIS — Z8579 Personal history of other malignant neoplasms of lymphoid, hematopoietic and related tissues: Secondary | ICD-10-CM | POA: Diagnosis not present

## 2016-10-11 DIAGNOSIS — C8319 Mantle cell lymphoma, extranodal and solid organ sites: Secondary | ICD-10-CM | POA: Diagnosis not present

## 2016-10-11 DIAGNOSIS — Z85038 Personal history of other malignant neoplasm of large intestine: Secondary | ICD-10-CM | POA: Diagnosis not present

## 2016-10-11 DIAGNOSIS — Z8572 Personal history of non-Hodgkin lymphomas: Secondary | ICD-10-CM | POA: Diagnosis not present

## 2016-10-20 DIAGNOSIS — K648 Other hemorrhoids: Secondary | ICD-10-CM | POA: Diagnosis not present

## 2016-10-20 DIAGNOSIS — K5900 Constipation, unspecified: Secondary | ICD-10-CM | POA: Diagnosis not present

## 2016-10-20 DIAGNOSIS — Z85038 Personal history of other malignant neoplasm of large intestine: Secondary | ICD-10-CM | POA: Diagnosis not present

## 2016-11-17 DIAGNOSIS — K648 Other hemorrhoids: Secondary | ICD-10-CM | POA: Diagnosis not present

## 2016-11-17 DIAGNOSIS — K219 Gastro-esophageal reflux disease without esophagitis: Secondary | ICD-10-CM | POA: Diagnosis not present

## 2016-11-24 DIAGNOSIS — Z6832 Body mass index (BMI) 32.0-32.9, adult: Secondary | ICD-10-CM | POA: Diagnosis not present

## 2016-11-24 DIAGNOSIS — I10 Essential (primary) hypertension: Secondary | ICD-10-CM | POA: Diagnosis not present

## 2016-11-24 DIAGNOSIS — J309 Allergic rhinitis, unspecified: Secondary | ICD-10-CM | POA: Diagnosis not present

## 2016-11-24 DIAGNOSIS — Z139 Encounter for screening, unspecified: Secondary | ICD-10-CM | POA: Diagnosis not present

## 2016-11-24 DIAGNOSIS — N3281 Overactive bladder: Secondary | ICD-10-CM | POA: Diagnosis not present

## 2016-11-24 DIAGNOSIS — E785 Hyperlipidemia, unspecified: Secondary | ICD-10-CM | POA: Diagnosis not present

## 2016-11-24 DIAGNOSIS — E559 Vitamin D deficiency, unspecified: Secondary | ICD-10-CM | POA: Diagnosis not present

## 2016-11-24 DIAGNOSIS — K219 Gastro-esophageal reflux disease without esophagitis: Secondary | ICD-10-CM | POA: Diagnosis not present

## 2016-12-12 ENCOUNTER — Other Ambulatory Visit (INDEPENDENT_AMBULATORY_CARE_PROVIDER_SITE_OTHER): Payer: Self-pay | Admitting: "Endocrinology

## 2016-12-20 DIAGNOSIS — N3946 Mixed incontinence: Secondary | ICD-10-CM | POA: Diagnosis not present

## 2016-12-23 ENCOUNTER — Ambulatory Visit (INDEPENDENT_AMBULATORY_CARE_PROVIDER_SITE_OTHER): Payer: Medicare PPO | Admitting: "Endocrinology

## 2016-12-28 DIAGNOSIS — N952 Postmenopausal atrophic vaginitis: Secondary | ICD-10-CM | POA: Diagnosis not present

## 2016-12-28 DIAGNOSIS — N3941 Urge incontinence: Secondary | ICD-10-CM | POA: Diagnosis not present

## 2017-01-05 ENCOUNTER — Ambulatory Visit (INDEPENDENT_AMBULATORY_CARE_PROVIDER_SITE_OTHER): Payer: Medicare PPO | Admitting: "Endocrinology

## 2017-01-05 ENCOUNTER — Other Ambulatory Visit (INDEPENDENT_AMBULATORY_CARE_PROVIDER_SITE_OTHER): Payer: Self-pay | Admitting: *Deleted

## 2017-01-05 VITALS — BP 100/70 | HR 120 | Wt 166.2 lb

## 2017-01-05 DIAGNOSIS — E6609 Other obesity due to excess calories: Secondary | ICD-10-CM | POA: Diagnosis not present

## 2017-01-05 DIAGNOSIS — R5383 Other fatigue: Secondary | ICD-10-CM | POA: Diagnosis not present

## 2017-01-05 DIAGNOSIS — E063 Autoimmune thyroiditis: Secondary | ICD-10-CM | POA: Diagnosis not present

## 2017-01-05 DIAGNOSIS — R231 Pallor: Secondary | ICD-10-CM

## 2017-01-05 DIAGNOSIS — E213 Hyperparathyroidism, unspecified: Secondary | ICD-10-CM | POA: Diagnosis not present

## 2017-01-05 DIAGNOSIS — E049 Nontoxic goiter, unspecified: Secondary | ICD-10-CM

## 2017-01-05 DIAGNOSIS — E559 Vitamin D deficiency, unspecified: Secondary | ICD-10-CM | POA: Diagnosis not present

## 2017-01-05 NOTE — Patient Instructions (Signed)
Follow up visit in 6 months. 

## 2017-01-05 NOTE — Progress Notes (Signed)
Subjective:  Patient Name: Joy Daniels Date of Birth: 10-10-1940  MRN: [id number]  Joy Daniels  presents to the office today for follow-up of her secondary hyperparathyroidism, hypocalcemia, vitamin D deficiency, iron deficiency anemia, dysgeusia, dyspepsia, intermittent abnormal TFTs/hypothyroidism, thyroiditis, nontoxic multinodular goiter, s/p colon cancer, and s/p mantle cell lymphoma.  HISTORY OF PRESENT ILLNESS:   Joy Daniels is a 76 y.o. Caucasian woman.  Joy Daniels was accompanied by her sister, Joy Daniels.   1. The patient was first referred to me on 02/01/05 by her primary care physician, Dr. Micheal Likens, for follow up of toxic nodular goiter. The patient was then 76 years of age.   A.  The patient had been previously evaluated by Dr. Genelle Bal, an adult endocrinologist based in [place]. Dr. London Pepper was performing an outreach clinic at the [facility] in [place], [place]. He evaluated the patient for hyperthyroidism and found that she had a toxic nodular goiter. He arranged for her to have radioactive iodine treatment on 12/13/04 with 24.5 mCi of I-131. When Dr. London Pepper retired, I was asked to handle the outreach clinic for the next several months. At the patient's first visit with me, many of her hyperthyroid symptoms and signs had significantly improved. Past medical history was positive for hypertension, osteoporosis, hip pains, dyspepsia, and GERD. She had a previous injury to her right hand that required surgical repair. Family history was positive for cancer in one sister and a heart attack in another sister. Her brother had diabetes and had a stroke. On physical examination she was hypertensive. She had a 25+ gram goiter. She also had pallor of the nailbeds of her fingers.   B. At her next follow up visit in [place] on 03/29/05, she felt much better. Her energy was much improved. Muscle strength was improving. She was still significantly bothered by her excessive belly hunger that was causing weight gain.  TFTs from 03/22/05 were normal, with a TSH of 1.11, a free T4 of 1.22, and a free T3 of 2.8. I started her on Nexium 40 mg per day. Since I was getting ready to terminate the outreach clinic in [place], I made arrangements for her to be followed at my clinic in [place].   2. During the last 12 years that I have followed her here in [place], we have dealt with several different problems:  A. I followed two aspects of her multinodular goiter, the nodules themselves and her thyroid hormone status.   1. A thyroid ultrasound performed on 10/26/06 showed a 1.5 cm dominant hyperechoic mass in the left upper pole. There were 2 smaller nodules in the right lobe measuring 6.9 and 7.6 mm in diameter. On 12/14/06 a fine needle aspirate was performed on the dominant nodule in the left upper lobe. The pathologists diagnosis was: "Rare benign follicular cells, abundant histiocytes and colloid. No neoplasms identified." A follow up thyroid ultrasound performed on 11/05/07 showed an echogenic nodule in the mid left lobe measuring 1.7 x 1.1 x 0.8 cm. This was not grossly changed from images obtained during the previous fine needle aspirate. There was also a smaller nodule in the left lobe measuring 1.3 x 0.6 cm.  In the inferior aspect of the right lobe there was a nodule measuring 1.1 x 0.7 x 0.5. Her next thyroid ultrasound performed on 01/14/2009 showed a partially calcified nodule in the lower pole of the right lobe measuring 1.8 x 0.6 x 0.8. By report, this nodule had increased somewhat in size. However, because the images from the 2009  study were not available, no direct comparison could be made. The largest nodule in the left lobe measured 1.6 x 1.0 x 0.8., essentially unchanged from the previous study. Since her clinical exam has been stable since 2010 I have not repeated the thyroid US.   2. The patient remained euthyroid through the spring of 2010. In September of 2010 and again in April of 2011 she became mildly  hypothyroid. It appeared that she was having some flare ups of Hashimoto's disease at those times. Her TFTs subsequently normalized on 11/26/10.  B. Because of her known osteoporosis, I wanted to treat her with a bisphosphonate. Since she had previously not tolerated Actonel well, she was reluctant. I finally convinced her to try Boniva, but she later discontinued that medication as well. She was not at all interested in doing Forteo injections. To further try to minimize bone loss, I monitored her bone mineral parameters closely. Up through her visit on 04/17/07 her parameters were normal, but she had many difficulties in taking all of her calcium and vitamin D pills, in part due to constipation. Unfortunately, by 12/03/07 her 25-hydroxy vitamin D dropped to 24.2 and her 1,25- dihydroxy vitamin D dropped to 23.3. It appeared not only was she deficient in vitamin D intake, but her kidneys were also not optimally converting 25-hydroxy vitamin D to 1, 25-dihydroxy hydroxy vitamin D. I prescribed vitamin D, 50,000 IU per week. I also added calcitriol, 0.25 mcg per week. When she took these medications, her bone parameters normalized. In early 2010, however, she had stopped taking these medications. Her PTH rose to 75 and then to 81.8. When she again took her calcium and her two vitamin D preparations, her PTH decreased progressively to 62.1 and then to 33 on 11/26/10. On that date, her serum calcium was 10.2, her 25-hydroxy vitamin D was 32.2, and her 1, 25- dihydroxy vitamin D was 28.2. By then, however, she had stopped taking the vitamin D 50,000 international units weekly because her insurance company would not cover it. Her calcium levels, vitamin D levels, and PTH levels have fluctuated ever since.   C.In 2011 she developed stage IV mantle cell lymphoma of the tonsils, stomach, and digestive tract and was successfully treated with chemotherapy for two years. In late 2014 she developed cancer of the ascending colon  and was successfully treated with surgery.    3. The patient's last [facility] visit was on 06/24/16.   A. In the interim, she has been fairly healthy.   B. She continues to have urinary incontinence, so her urologist put her on Toviaz (fesoterodine) once daily for  6 weeks. She is not sure if the medicine is working. She thinks that this medicine causes her to have a dry mouth and constipation, so she is now taking Miralax. Although she previously said that she did not want urologic surgery, she is now considering surgery if the medication is not successful.   C. She shows no signs of recurrence of her mantle cell lymphoma or her colon cancer. She is followed by oncology every 6 months, next visit in 2 months. She did have one CBC about one month ago that showed a high WBC count.   D."I don't have any energy". She is not having to spend as much time taking care of her grandchildren, but still does some. She is not walking much.    E. She has had some seasonal allergy symptoms recently, but has not had many COPD symptoms. She does become  somewhat short of breath if she is too active. She has not needed to use either her inhaler or her nebulizer.    F. She still has peripheral neuropathy that cause her to have some balance problems. She uses a walker for most walks.   G. She is taking her calcitriol, 0.25 mcg, twice weekly; Citracal with D twice daily; omeprazole 40, once daily; metoprolol ER, 50 mg, once daily; B12, 100 mcg, once daily; KCl ER 10 mEq, twice daily; Losartan 25 mg/day, HCTZ 12.5 mg/day;  ezetimibe, 10 mg/day, Montelukast, 10 mg, once daily. She also takes Vitamin D, 50,000 IU weekly.   4. Pertinent Review of Systems:  Constitutional: The patient feels "fine, but my throat hurts and I'm more dizzy". She still gets short of breath with exertion due to her COPD.  Her appetite is "good, but I sometimes don't know what I want to eat".   Eyes: Vision is better since having bilateral cataractectomies.  Her eye dryness is still occasionally a problem, so she sometimes has to use her moisturizing drops. Her eyelids still droop, more so on the right. She does not have a follow up eye appointment.     Mouth: Mouth is still dry.    Neck: She has not had any thyroid pains or inflammation. She occasionally has left postero-lateral neck pains, as she has had off and on for years. Her range of motion of the cervical spine is much reduced. She no longer has difficulty swallowing unless her mouth gets too dry.    Heart: She is not aware of any palpitations, chest pain, or chest pressure.  Lungs: Her COPD is probably unchanged, but she still gets short of breath with minor exertion.  Gastrointestinal: She is more constipated, but the Miralax and Benefiber help. The patient has no complaints of excessive hunger, acid reflux, upset stomach, stomach aches or pains. Legs: She has not had many calf cramps at night since her last visit.  Muscle mass and strength seem normal. There are no complaints of numbness, tingling, or burning. She does not have much edema as long as she takes her HCTZ.  Feet: There are no obvious foot problems. Her feet often burn at night. She has occasional edema.  Neuro: Her balance is not as good.    PAST MEDICAL, FAMILY, AND SOCIAL HISTORY:  Past Medical History:  Diagnosis Date  . Combined hyperlipidemia   . COPD (chronic obstructive pulmonary disease) ([place])   . Dysgeusia   . GERD (gastroesophageal reflux disease)   . Hypercalcemia   . Hyperparathyroidism , secondary, non-renal ([place])   . Hypertension   . Hypothyroidism, acquired, autoimmune   . Iron deficiency   . Lymphoma, mantle cell, multiple sites ([place])   . Nontoxic multinodular goiter   . Obesity (BMI 30.0-34.9)   . Other osteoporosis   . Pallor   . Thyroiditis, autoimmune   . Toxic nodular goiter   . Vitamin D deficiency disease     Family History  Problem Relation Age of Onset  . Diabetes Father   . Cancer  Sister   . Heart disease Sister      Current Outpatient Prescriptions:  .  calcitRIOL (ROCALTROL) 0.25 MCG capsule, TAKE 1 CAPSULE TWICE WEEKLY, Disp: 25 capsule, Rfl: 3 .  ezetimibe (ZETIA) 10 MG tablet, Take 10 mg by mouth daily., Disp: , Rfl:  .  fesoterodine (TOVIAZ) 4 MG TB24 tablet, Take 4 mg by mouth daily., Disp: , Rfl:  .  hydrochlorothiazide (MICROZIDE) 12.5  MG capsule, Take 12.5 mg by mouth daily., Disp: , Rfl:  .  losartan (COZAAR) 25 MG tablet, Take 25 mg by mouth daily., Disp: , Rfl:  .  montelukast (SINGULAIR) 10 MG tablet, Take 10 mg by mouth at bedtime., Disp: , Rfl:  .  omeprazole (PRILOSEC) 10 MG capsule, Take 10 mg by mouth daily.  , Disp: , Rfl:  .  potassium chloride (K-DUR) 10 MEQ tablet, Take 10 mEq by mouth 2 (two) times daily., Disp: , Rfl:  .  colesevelam (WELCHOL) 625 MG tablet, Take by mouth 2 (two) times daily with a meal. 3 tablest bid, Disp: , Rfl:   Allergies as of 01/05/2017 - Review Complete 01/05/2017  Allergen Reaction Noted  . Codeine  01/27/2011    1. Work and Family: Her family is supportive. Chantilly lives with her husband, daughter [name], and three adult grandsons.    2. Activities: She does not do any regular physical activity, but stays active with shopping, house work, and child care. 3. Smoking, alcohol, or drugs: None 4. Primary Care Provider: Dr. Gae Bon B. Uppin  REVIEW OF SYSTEMS: There are no other significant problems involving Ramonica's other body systems.   Objective:  Vital Signs:  BP 100/70   Pulse (!) 120   Wt 166 lb 3.2 oz (75.4 kg)   BMI 32.46 kg/m  Repeat HR was 96.    Ht Readings from Last 3 Encounters:  10/12/10 5' (1.524 m)  02/11/10 5' (1.524 m)   Wt Readings from Last 3 Encounters:  01/05/17 166 lb 3.2 oz (75.4 kg)  06/24/16 160 lb 12.8 oz (72.9 kg)  12/22/15 168 lb 12.8 oz (76.6 kg)   PHYSICAL EXAM:  Constitutional: The patient really seems good today. She has re-gained 6 pounds since her last visit. She is  alert, oriented, and quite lucid. Her sense of humor and affect are very normal. Ir is always a pleasure to see her. Face: Her face appears very lined and worn. She looks about 68 years older than her chronologic age.  Eyes: There is no obvious arcus or proptosis. Moisture appears normal. She has a slight droop of both eyelids, a bit more noticeable on the right.   Mouth: The oropharynx is reddened posteriorly, c/w a viral illness or allergies. The tongue appears normal. Oral moisture is somewhat low.  Neck: The neck appears to be visibly normal. No carotid bruits are noted. The thyroid gland is low-lying. The thyroid gland is about 18 grams in size.  Both lobes are normal in size today. The consistency of the thyroid gland is normal. The thyroid gland is not tender to palpation today. She has limited range of motion of her cervical spine. Her left trapezius muscle and left nuchal cord are tight and slightly tender today.  Lungs: The lungs are clear to auscultation. Air movement is good. Heart: Heart rate and rhythm are regular. Heart sounds S1 and S2 are normal. I did not appreciate any pathologic cardiac murmurs. Abdomen: The abdomen is enlarged. Bowel sounds are normal. There is no obvious hepatomegaly, splenomegaly, or other mass effect.  Arms: Muscle size and bulk are normal for age. Hands: There is no obvious tremor. Phalangeal and metacarpophalangeal joints are normal. Palmar muscles are normal. Palmar skin is normal. Palmar moisture is also normal. Her nail pallor has recurred.  Legs: Muscles appear normal for age. She has trace edema in the legs today. Feet: !+ DP pulses, trace PT pulses Neurologic: Strength is fairly normal for age  in both the upper and lower extremities. Muscle tone is normal. Sensation to touch is normal in both legs, but slightly decreased in the left heel.      LAB DATA:   Labs 10/04/16: Surgical pathology: Peripheral blood flow cytometry: Relative larger amount of T  cells than B cells and reversal of the CD4:CD8 ratio  Labs 06/24/16: TSH 2.36, free T4 0,5, free T3 2.5; CMP normal, except for bilirubin 1.6 and ALT 40; 25-OH vitamin D 42  Labs 12/22/15: TSH 2.66, free T4 1.2, free T3 2.4; CMP normal except for AST 39 and ALT 48; PTH 79 (ref 14-64), calcium 10.0,  25-OH vitamin D 47, 1,25-dihydroxy vitamin d 40 (ref 18.72)  Labs: 11/18/14: 25-OH vitamin D 32.9, calcium 10.1; ALT 42; cholesterol 237, triglycerides 290, HDL 46, LDL 133  Labs 08/14/14: 25-OH vitamin D 31.9, calcium 10.6; ALT 33; TSH 3.47   Labs 11/05/13: TSH 2.902, free T4 1.06, free T3 2.5; 1,25-dihydroxy vitamin D 39; 25-hydroxy vitamin D 47, PTH 72, calcium 10.1  Labs 06/11/13: 1,25-dihydroxy vitamin D 39, 25-hydroxy vitamin D 47, PTH 72, calcium 10.1 ; TSH 2.901, free T4 1.06, free T3  2.5  Labs 12/10/12: Hgb 11.1, Hct 33.6%; TSH 2.964, free T4 1.06, free T3 2.4; 25-hydroxy vitamin D 37, PTH 89.4, calcium 9.9; CMP normal  Labs 06/11/12: CMP was normal, to include a calcium of 9.9; TSH 2.195,  free T4 0.98, free T3 2.6; PTH 102.3, 25-hydroxy vitamin D 29, 1,25-dihydroxy vitamin D 57.  Labs 11/17/11: PTH 70.9,calcium 10.0, 25-hydroxy vitamin D 40, 1,25-dihydroxy vitamin D 67, TSH 2.312. Free T4  0.97, Free T3 2.4  Labs 05/19/11: PTH 64, calcium 10.4, 25-hydroxy vitamin D 36, 1,25-dihydroxy vitamin D 47, TSH 2.497, free T4 1.04, free T3 2.6   Assessment and Plan:   ASSESSMENT:  1. Obesity: The patient had lost some weight in the 6 months prior to her last visit, but has re-gained 6 pounds since then. She needs to walk more.  2. Vitamin D deficiency: Patient's 25-hydroxy vitamin D level in April 2017 and in October 2017 was normal. Her 1,25-dihydroxy vitamin D level in April 2017 was also normal. The combination of oral vitamin D and oral calcitriol is working successfully for her.  3. Thyroiditis/goiter: Her Hashimoto's disease was quiescent at her last visit and is quiescent today. Her thyroid  gland is again within normal limits for size today. The episodic "tenderness" that she has had previously and the process of waxing and waning of thyroid gland size are c/w thyroiditis.  She was euthyroid again in April and October 2017. 4. Dyspepsia: She is doing better on omeprazole. 5. Hyperparathyroidism, secondary to calcium and vitamin D intake deficiency: Her PTH has been mildly elevated for several years, but her calciums have been in the upper quartile of the normal range. It is possible that she could be developing tertiary hyperparathyroidism. We need to repeat her labs now.  6. Dysgeusia: Her dysgeusia improved after resuming her Centrum Silver. 7. Fatigue: Patient's fatigue is still present. She needs to walk more.   8. Lymphoma: Her lymphoma is in remission, which is wonderful.  9. Colon cancer: Her colon cancer is also in remission, which is also wonderful.  11.Calf cramps and restless legs:  These problems have resolved.   12. Trapezius spasm and tight nuchal cord: She likely has some degenerative arthritis of the cervical spine, causing pinching of her left cervical nerves. She needs to resume her cervical stretching exercises that she can  do several times per day.  13. Combined hyperlipidemia: Dr. Rica Records is following this problem for her.  14. Pallor: She will need a CBC and CMP when she sees her oncologist.  PLAN:  1. Diagnostic: Repeat calcium, PTH, 25-OH vitamin D, and 1,25-dihydroxy vitamin D now. Repeat TFTS and CMP at next visit. Consider BMD. Consider repeating her thyroid US if her exam changes. 2. Therapeutic: Continue calcitriol at one tablet/capsule, twice weekly. Please take all of her medications, to include her calcitriol, calcium, Welchol, and vitamin D. Continue to take a multivitamin pill and one vitamin B6 capsule daily 3. 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 We need to  keep up her intake of calcium and vitamin D. 4. Follow-up: 6 months  Level of Service: This visit lasted in excess of 80 minutes. More than 50% of the visit was devoted to counseling.  Tillman Sers, MD, CDE Adult and Pediatric Endocrinology 01/05/2017 12:24 PM

## 2017-01-06 LAB — PTH, INTACT AND CALCIUM
Calcium: 10.3 mg/dL (ref 8.6–10.4)
PTH: 71 pg/mL — ABNORMAL HIGH (ref 14–64)

## 2017-01-06 LAB — VITAMIN D 25 HYDROXY (VIT D DEFICIENCY, FRACTURES): Vit D, 25-Hydroxy: 58 ng/mL (ref 30–100)

## 2017-01-07 ENCOUNTER — Encounter (INDEPENDENT_AMBULATORY_CARE_PROVIDER_SITE_OTHER): Payer: Self-pay | Admitting: "Endocrinology

## 2017-01-09 LAB — VITAMIN D 1,25 DIHYDROXY
VITAMIN D 1, 25 (OH) TOTAL: 56 pg/mL (ref 18–72)
Vitamin D2 1, 25 (OH)2: 35 pg/mL
Vitamin D3 1, 25 (OH)2: 21 pg/mL

## 2017-01-25 DIAGNOSIS — R682 Dry mouth, unspecified: Secondary | ICD-10-CM | POA: Diagnosis not present

## 2017-01-25 DIAGNOSIS — K5900 Constipation, unspecified: Secondary | ICD-10-CM | POA: Diagnosis not present

## 2017-01-25 DIAGNOSIS — N952 Postmenopausal atrophic vaginitis: Secondary | ICD-10-CM | POA: Diagnosis not present

## 2017-01-25 DIAGNOSIS — N3281 Overactive bladder: Secondary | ICD-10-CM | POA: Diagnosis not present

## 2017-02-20 ENCOUNTER — Encounter (INDEPENDENT_AMBULATORY_CARE_PROVIDER_SITE_OTHER): Payer: Self-pay

## 2017-02-22 DIAGNOSIS — Z9181 History of falling: Secondary | ICD-10-CM | POA: Diagnosis not present

## 2017-02-22 DIAGNOSIS — Z0000 Encounter for general adult medical examination without abnormal findings: Secondary | ICD-10-CM | POA: Diagnosis not present

## 2017-02-22 DIAGNOSIS — E785 Hyperlipidemia, unspecified: Secondary | ICD-10-CM | POA: Diagnosis not present

## 2017-02-22 DIAGNOSIS — Z6832 Body mass index (BMI) 32.0-32.9, adult: Secondary | ICD-10-CM | POA: Diagnosis not present

## 2017-02-22 DIAGNOSIS — Z136 Encounter for screening for cardiovascular disorders: Secondary | ICD-10-CM | POA: Diagnosis not present

## 2017-02-22 DIAGNOSIS — Z0100 Encounter for examination of eyes and vision without abnormal findings: Secondary | ICD-10-CM | POA: Diagnosis not present

## 2017-02-22 DIAGNOSIS — Z1389 Encounter for screening for other disorder: Secondary | ICD-10-CM | POA: Diagnosis not present

## 2017-03-03 DIAGNOSIS — J441 Chronic obstructive pulmonary disease with (acute) exacerbation: Secondary | ICD-10-CM | POA: Diagnosis not present

## 2017-03-03 DIAGNOSIS — R3989 Other symptoms and signs involving the genitourinary system: Secondary | ICD-10-CM | POA: Diagnosis not present

## 2017-03-03 DIAGNOSIS — Z6831 Body mass index (BMI) 31.0-31.9, adult: Secondary | ICD-10-CM | POA: Diagnosis not present

## 2017-03-28 DIAGNOSIS — Z131 Encounter for screening for diabetes mellitus: Secondary | ICD-10-CM | POA: Diagnosis not present

## 2017-03-28 DIAGNOSIS — E559 Vitamin D deficiency, unspecified: Secondary | ICD-10-CM | POA: Diagnosis not present

## 2017-03-28 DIAGNOSIS — J309 Allergic rhinitis, unspecified: Secondary | ICD-10-CM | POA: Diagnosis not present

## 2017-03-28 DIAGNOSIS — Z79899 Other long term (current) drug therapy: Secondary | ICD-10-CM | POA: Diagnosis not present

## 2017-03-28 DIAGNOSIS — Z6831 Body mass index (BMI) 31.0-31.9, adult: Secondary | ICD-10-CM | POA: Diagnosis not present

## 2017-03-28 DIAGNOSIS — I10 Essential (primary) hypertension: Secondary | ICD-10-CM | POA: Diagnosis not present

## 2017-03-28 DIAGNOSIS — J439 Emphysema, unspecified: Secondary | ICD-10-CM | POA: Diagnosis not present

## 2017-03-28 DIAGNOSIS — E785 Hyperlipidemia, unspecified: Secondary | ICD-10-CM | POA: Diagnosis not present

## 2017-03-28 DIAGNOSIS — E669 Obesity, unspecified: Secondary | ICD-10-CM | POA: Diagnosis not present

## 2017-03-29 DIAGNOSIS — H02834 Dermatochalasis of left upper eyelid: Secondary | ICD-10-CM | POA: Diagnosis not present

## 2017-03-29 DIAGNOSIS — H02831 Dermatochalasis of right upper eyelid: Secondary | ICD-10-CM | POA: Diagnosis not present

## 2017-03-29 DIAGNOSIS — H524 Presbyopia: Secondary | ICD-10-CM | POA: Diagnosis not present

## 2017-03-29 DIAGNOSIS — H26493 Other secondary cataract, bilateral: Secondary | ICD-10-CM | POA: Diagnosis not present

## 2017-04-07 DIAGNOSIS — C8319 Mantle cell lymphoma, extranodal and solid organ sites: Secondary | ICD-10-CM | POA: Diagnosis not present

## 2017-04-07 DIAGNOSIS — R978 Other abnormal tumor markers: Secondary | ICD-10-CM | POA: Diagnosis not present

## 2017-04-10 DIAGNOSIS — Z8572 Personal history of non-Hodgkin lymphomas: Secondary | ICD-10-CM | POA: Diagnosis not present

## 2017-04-10 DIAGNOSIS — Z85038 Personal history of other malignant neoplasm of large intestine: Secondary | ICD-10-CM | POA: Diagnosis not present

## 2017-04-10 DIAGNOSIS — C8319 Mantle cell lymphoma, extranodal and solid organ sites: Secondary | ICD-10-CM | POA: Diagnosis not present

## 2017-07-10 ENCOUNTER — Encounter (INDEPENDENT_AMBULATORY_CARE_PROVIDER_SITE_OTHER): Payer: Self-pay | Admitting: "Endocrinology

## 2017-07-10 ENCOUNTER — Ambulatory Visit (INDEPENDENT_AMBULATORY_CARE_PROVIDER_SITE_OTHER): Payer: Medicare PPO | Admitting: "Endocrinology

## 2017-07-10 VITALS — BP 130/76 | HR 88 | Wt 163.3 lb

## 2017-07-10 DIAGNOSIS — R5383 Other fatigue: Secondary | ICD-10-CM

## 2017-07-10 DIAGNOSIS — E049 Nontoxic goiter, unspecified: Secondary | ICD-10-CM

## 2017-07-10 DIAGNOSIS — E063 Autoimmune thyroiditis: Secondary | ICD-10-CM | POA: Diagnosis not present

## 2017-07-10 DIAGNOSIS — R432 Parageusia: Secondary | ICD-10-CM

## 2017-07-10 DIAGNOSIS — E559 Vitamin D deficiency, unspecified: Secondary | ICD-10-CM

## 2017-07-10 DIAGNOSIS — E211 Secondary hyperparathyroidism, not elsewhere classified: Secondary | ICD-10-CM

## 2017-07-10 NOTE — Progress Notes (Signed)
Subjective:  Patient Name: Joy Daniels Date of Birth: Aug 29, 1941  MRN: [id number]  Joy Daniels  presents to the office today for follow-up of her secondary hyperparathyroidism, hypocalcemia, vitamin D deficiency, iron deficiency anemia, dysgeusia, dyspepsia, intermittent abnormal TFTs/hypothyroidism, thyroiditis, nontoxic multinodular goiter, s/p colon cancer, and s/p mantle cell lymphoma.  HISTORY OF PRESENT ILLNESS:   Joy Daniels is a 76 y.o. Caucasian woman.  Joy Daniels was accompanied by her sister, Joy Daniels.   1. The patient was first referred to me on 02/01/05 by her primary care physician, Dr. Micheal Likens, for follow up of toxic nodular goiter. The patient was then 77 years of age.   A.  The patient had been previously evaluated by Dr. Genelle Bal, an adult endocrinologist based in [place]. Dr. London Pepper was performing an outreach clinic at the [facility] in [place], [place]. He evaluated the patient for hyperthyroidism and found that she had a toxic nodular goiter. He arranged for her to have radioactive iodine treatment on 12/13/04 with 24.5 mCi of I-131. When Dr. London Pepper retired, I was asked to handle the outreach clinic for the next several months. At the patient's first visit with me, many of her hyperthyroid symptoms and signs had significantly improved. Past medical history was positive for hypertension, osteoporosis, hip pains, dyspepsia, and GERD. She had a previous injury to her right hand that required surgical repair. Family history was positive for cancer in one sister and a heart attack in another sister. Her brother had diabetes and had a stroke. On physical examination she was hypertensive. She had a 25+ gram goiter. She also had pallor of the nailbeds of her fingers.   B. At her next follow up visit in [place] on 03/29/05, she felt much better. Her energy was much improved. Muscle strength was improving. She was still significantly bothered by her excessive belly hunger that was causing weight gain.  TFTs from 03/22/05 were normal, with a TSH of 1.11, a free T4 of 1.22, and a free T3 of 2.8. I started her on Nexium 40 mg per day. Since I was getting ready to terminate the outreach clinic in [place], I made arrangements for her to be followed at my clinic in [place].   2. During the last 12 years that I have followed her here in [place], we have dealt with several different problems:  A. I followed two aspects of her multinodular goiter, the nodules themselves and her thyroid hormone status.   1. A thyroid ultrasound performed on 10/26/06 showed a 1.5 cm dominant hyperechoic mass in the left upper pole. There were 2 smaller nodules in the right lobe measuring 6.9 and 7.6 mm in diameter. On 12/14/06 a fine needle aspirate was performed on the dominant nodule in the left upper lobe. The pathologists diagnosis was: "Rare benign follicular cells, abundant histiocytes and colloid. No neoplasms identified." A follow up thyroid ultrasound performed on 11/05/07 showed an echogenic nodule in the mid left lobe measuring 1.7 x 1.1 x 0.8 cm. This was not grossly changed from images obtained during the previous fine needle aspirate. There was also a smaller nodule in the left lobe measuring 1.3 x 0.6 cm.  In the inferior aspect of the right lobe there was a nodule measuring 1.1 x 0.7 x 0.5. Her next thyroid ultrasound performed on 01/14/2009 showed a partially calcified nodule in the lower pole of the right lobe measuring 1.8 x 0.6 x 0.8. By report, this nodule had increased somewhat in size. However, because the images from the 2009  study were not available, no direct comparison could be made. The largest nodule in the left lobe measured 1.6 x 1.0 x 0.8., essentially unchanged from the previous study. Since her clinical exam has been stable since 2010 I have not repeated the thyroid US.   2. The patient remained euthyroid through the spring of 2010. In September of 2010 and again in April of 2011 she became mildly  hypothyroid. It appeared that she was having some flare ups of Hashimoto's disease at those times. Her TFTs subsequently normalized on 11/26/10.  B. Because of her known osteoporosis, I wanted to treat her with a bisphosphonate. Since she had previously not tolerated Actonel well, she was reluctant. I finally convinced her to try Boniva, but she later discontinued that medication as well. She was not at all interested in doing Forteo injections. To further try to minimize bone loss, I monitored her bone mineral parameters closely. Up through her visit on 04/17/07 her parameters were normal, but she had many difficulties in taking all of her calcium and vitamin D pills, in part due to constipation. Unfortunately, by 12/03/07 her 25-hydroxy vitamin D dropped to 24.2 and her 1,25- dihydroxy vitamin D dropped to 23.3. It appeared not only was she deficient in vitamin D intake, but her kidneys were also not optimally converting 25-hydroxy vitamin D to 1, 25-dihydroxy hydroxy vitamin D. I prescribed vitamin D, 50,000 IU per week. I also added calcitriol, 0.25 mcg per week. When she took these medications, her bone parameters normalized. In early 2010, however, she had stopped taking these medications. Her PTH rose to 75 and then to 81.8. When she again took her calcium and her two vitamin D preparations, her PTH decreased progressively to 62.1 and then to 33 on 11/26/10. On that date, her serum calcium was 10.2, her 25-hydroxy vitamin D was 32.2, and her 1, 25- dihydroxy vitamin D was 28.2. By then, however, she had stopped taking the vitamin D 50,000 international units weekly because her insurance company would not cover it. Her calcium levels, vitamin D levels, and PTH levels have fluctuated ever since.   C.In 2011 she developed stage IV mantle cell lymphoma of the tonsils, stomach, and digestive tract and was successfully treated with chemotherapy for two years. In late 2014 she developed cancer of the ascending colon  and was successfully treated with surgery.    3. The patient's last [facility] visit was on 01/05/17.   A. In the interim, she had been fairly healthy until she had her flu shot and her pneumonia shot last week. She then developed a bad headache and some URI symptoms. She is gradually getting better.    B. She continues to have urinary incontinence, so her urologist discontinued Toviaz (fesoterodine) and put her on a different medication. She does not think that the medicine is working. She still has problems with dry mouth and constipation, so she is now taking Benefiber and Miralax. Although she previously said that she did not want urologic surgery, she is now considering surgery if the medication is not successful.   C. She shows no signs of recurrence of her mantle cell lymphoma or her colon cancer. She is followed by oncology every 6 months.   D."I lost my energy". She is not having to spend as much time taking care of her grandchildren, but still does some. She is not walking much.    E. She has had some attacks of shortness of breath, which she attributes to allergies or  COPD. She still becomes somewhat short of breath if she is too active. She has not needed to use either her inhaler or her nebulizer.    F. She still has peripheral neuropathy that cause her to have some balance problems. She uses a walker for most walks.   G. She is taking her calcitriol, 0.25 mcg, twice weekly; Citracal with D twice daily; omeprazole 40, once daily; metoprolol ER, 50 mg, once daily; B12, 100 mcg, once daily; KCl ER 10 mEq, twice daily; Losartan 25 mg/day, HCTZ 12.5 mg/day;  ezetimibe, 10 mg/day, Montelukast, 10 mg, once daily. She also takes Vitamin D, 50,000 IU weekly.   H. Food does not taste good.   I. Her hair is coming out.   4. Pertinent Review of Systems:  Constitutional: The patient feels "not as good as before the flu shot". "I never feel good, but I keep myself going." She still gets short of breath with  exertion due to her COPD.  Her appetite is "good, but I sometimes don't know what I want to eat".   Eyes: Vision is better since having bilateral cataractectomies, but the vision is not as good in her right eye. She had an eye appointment recently, but no changes were made in her glasses. She does not wear the glasses often. Mouth: Mouth is still dry.    Neck: She has not had any thyroid pains or inflammation. She occasionally still has left postero-lateral neck pains, as she has had off and on for years. Her range of motion of the cervical spine is much reduced. She no longer has difficulty swallowing unless her mouth gets too dry.    Heart: She is not aware of any palpitations, chest pain, or chest pressure.  Lungs: Her COPD is probably unchanged, but she still gets short of breath with minor exertion.  Gastrointestinal: She is not constipated when she takes her Miralax and Benefiber. The patient has no complaints of excessive hunger, acid reflux, upset stomach, stomach aches or pains. Legs: She has had more calf cramps at night since her last visit.  Muscle mass and strength seem normal. There are no complaints of numbness, tingling, or burning. She does not have much edema as long as she takes her HCTZ.  Feet: There are no obvious foot problems. Her feet often burn at night. She has occasional edema.  Neuro: Her balance is not as good.    PAST MEDICAL, FAMILY, AND SOCIAL HISTORY:  Past Medical History:  Diagnosis Date  . Combined hyperlipidemia   . COPD (chronic obstructive pulmonary disease) ([place])   . Dysgeusia   . GERD (gastroesophageal reflux disease)   . Hypercalcemia   . Hyperparathyroidism , secondary, non-renal ([place])   . Hypertension   . Hypothyroidism, acquired, autoimmune   . Iron deficiency   . Lymphoma, mantle cell, multiple sites ([place])   . Nontoxic multinodular goiter   . Obesity (BMI 30.0-34.9)   . Other osteoporosis   . Pallor   . Thyroiditis, autoimmune   . Toxic  nodular goiter   . Vitamin D deficiency disease     Family History  Problem Relation Age of Onset  . Diabetes Father   . Cancer Sister   . Heart disease Sister      Current Outpatient Prescriptions:  .  calcitRIOL (ROCALTROL) 0.25 MCG capsule, TAKE 1 CAPSULE TWICE WEEKLY, Disp: 25 capsule, Rfl: 3 .  ezetimibe (ZETIA) 10 MG tablet, Take 10 mg by mouth daily., Disp: , Rfl:  .  fesoterodine (TOVIAZ) 4 MG TB24 tablet, Take 4 mg by mouth daily., Disp: , Rfl:  .  hydrochlorothiazide (MICROZIDE) 12.5 MG capsule, Take 12.5 mg by mouth daily., Disp: , Rfl:  .  losartan (COZAAR) 25 MG tablet, Take 25 mg by mouth daily., Disp: , Rfl:  .  montelukast (SINGULAIR) 10 MG tablet, Take 10 mg by mouth at bedtime., Disp: , Rfl:  .  omeprazole (PRILOSEC) 10 MG capsule, Take 10 mg by mouth daily.  , Disp: , Rfl:  .  potassium chloride (K-DUR) 10 MEQ tablet, Take 10 mEq by mouth 2 (two) times daily., Disp: , Rfl:  .  colesevelam (WELCHOL) 625 MG tablet, Take by mouth 2 (two) times daily with a meal. 3 tablest bid, Disp: , Rfl:   Allergies as of 07/10/2017 - Review Complete 07/10/2017  Allergen Reaction Noted  . Codeine  01/27/2011    1. Work and Family: Her family is supportive. Jaymi lives with her husband, daughter [name], and three adult grandsons.    2. Activities: She does not do any regular physical activity, but stays active with shopping, house work, and child care. 3. Smoking, alcohol, or drugs: None 4. Primary Care Provider: Dr. Gae Bon B. Uppin  REVIEW OF SYSTEMS: There are no other significant problems involving Kassidy's other body systems.   Objective:  Vital Signs:  BP 130/76   Pulse 88   Wt 163 lb 4.8 oz (74.1 kg)   BMI 31.89 kg/m  Repeat HR was 96.    Ht Readings from Last 3 Encounters:  10/12/10 5' (1.524 m)  02/11/10 5' (1.524 m)   Wt Readings from Last 3 Encounters:  07/10/17 163 lb 4.8 oz (74.1 kg)  01/05/17 166 lb 3.2 oz (75.4 kg)  06/24/16 160 lb 12.8 oz (72.9 kg)    PHYSICAL EXAM:  Constitutional: The patient looks good today. She has lost 3 pounds since her last visit. She is alert, oriented, and quite lucid. Her sense of humor and affect are very normal. It is always a pleasure to see her. Face: Her face appears very lined and worn. She looks about 29 years older than her chronologic age.  Eyes: There is no obvious arcus or proptosis. Moisture appears normal. She has a slight droop of both eyelids, a bit more noticeable on the right.   Mouth: The oropharynx is normal. The tongue appears normal. Oral moisture is somewhat low.  Neck: The neck appears to be visibly normal. No carotid bruits are noted. The thyroid gland is low-lying. The thyroid gland is about 18 grams in size.  Both lobes are normal in size today. The consistency of the thyroid gland is normal. The thyroid gland is not tender to palpation today. She has limited range of motion of her cervical spine. Her left trapezius muscle and left nuchal cord are again tight and slightly tender today.  Lungs: The lungs are clear to auscultation. Air movement is good. Heart: Heart rate and rhythm are regular. Heart sounds S1 and S2 are normal. I did not appreciate any pathologic cardiac murmurs. Abdomen: The abdomen is enlarged. Bowel sounds are normal. There is no obvious hepatomegaly, splenomegaly, or other mass effect.  Arms: Muscle size and bulk are normal for age. Hands: There is no obvious tremor. Phalangeal and metacarpophalangeal joints are normal. Palmar muscles are normal. Palmar skin is normal. Palmar moisture is also normal. Her nail pallor has recurred.  Legs: Muscles appear normal for age. She has no edema in the legs today. Feet: !+  DP pulses, trace PT pulses Neurologic: Strength is fairly normal for age in both the upper and lower extremities. Muscle tone is normal. Sensation to touch is normal in both legs.      LAB DATA:   Labs 01/05/17: PTH 71 (ref 14-64), calcium 10.3, 25-OH vitamin D  58, 1,25-dihydroxy vitamin D 56 (ref 18-72)  Labs 10/04/16: Surgical pathology: Peripheral blood flow cytometry: Relative larger amount of T cells than B cells and reversal of the CD4:CD8 ratio  Labs 06/24/16: TSH 2.36, free T4 0,5, free T3 2.5; CMP normal, except for bilirubin 1.6 and ALT 40; 25-OH vitamin D 42  Labs 12/22/15: TSH 2.66, free T4 1.2, free T3 2.4; CMP normal except for AST 39 and ALT 48; PTH 79 (ref 14-64), calcium 10.0,  25-OH vitamin D 47, 1,25-dihydroxy vitamin d 40 (ref 18.72)  Labs: 11/18/14: 25-OH vitamin D 32.9, calcium 10.1; ALT 42; cholesterol 237, triglycerides 290, HDL 46, LDL 133  Labs 08/14/14: 25-OH vitamin D 31.9, calcium 10.6; ALT 33; TSH 3.47   Labs 11/05/13: TSH 2.902, free T4 1.06, free T3 2.5; 1,25-dihydroxy vitamin D 39; 25-hydroxy vitamin D 47, PTH 72, calcium 10.1  Labs 06/11/13: 1,25-dihydroxy vitamin D 39, 25-hydroxy vitamin D 47, PTH 72, calcium 10.1 ; TSH 2.901, free T4 1.06, free T3  2.5  Labs 12/10/12: Hgb 11.1, Hct 33.6%; TSH 2.964, free T4 1.06, free T3 2.4; 25-hydroxy vitamin D 37, PTH 89.4, calcium 9.9; CMP normal  Labs 06/11/12: CMP was normal, to include a calcium of 9.9; TSH 2.195,  free T4 0.98, free T3 2.6; PTH 102.3, 25-hydroxy vitamin D 29, 1,25-dihydroxy vitamin D 57.  Labs 11/17/11: PTH 70.9,calcium 10.0, 25-hydroxy vitamin D 40, 1,25-dihydroxy vitamin D 67, TSH 2.312. Free T4  0.97, Free T3 2.4  Labs 05/19/11: PTH 64, calcium 10.4, 25-hydroxy vitamin D 36, 1,25-dihydroxy vitamin D 47, TSH 2.497, free T4 1.04, free T3 2.6   Assessment and Plan:   ASSESSMENT:  1. Obesity: The patient had lost some weight in the past 6 months. Food does not taste good.  She needs to walk more.  2. Vitamin D deficiency: Patient's 25-hydroxy vitamin D levels in April 2017, October 2017, and April 2018 were normal. Her 1,25-dihydroxy vitamin D level in April 2017 and April 2018 were also normal. The combination of oral vitamin D and oral calcitriol is working  successfully for her.  3. Thyroiditis/goiter: Her Hashimoto's disease was quiescent at her last 2 visits and is quiescent today. Her thyroid gland is again within normal limits for size today. The episodic "tenderness" that she has had previously and the process of waxing and waning of thyroid gland size are c/w thyroiditis.  She was euthyroid again in April and October 2017. 4. Dyspepsia: She is doing better on omeprazole. 5. Hyperparathyroidism, secondary to calcium and vitamin D intake deficiency: Her PTH has been mildly elevated for several years, but her calciums have been in the upper quartile of the normal range. It is possible that she could be developing tertiary hyperparathyroidism. We will follow this problem over time.  6. Dysgeusia: Her dysgeusia improved after resuming her Centrum Silver. She might benefit from also taking a B complex vitamin pill daily.  7. Fatigue: Patient's fatigue is still present. She needs to walk more.   8. Lymphoma: Her lymphoma is in remission, which is wonderful.  9. Colon cancer: Her colon cancer is also in remission, which is also wonderful.  11. Calf cramps and restless legs:  These problems have  recurred. I again  recommended that she stretch the calves before going to bed.  12. Trapezius spasm and tight nuchal cord: She likely has some degenerative arthritis of the cervical spine, causing pinching of her left cervical nerves. She needs to resume her cervical stretching exercises that she can do several times per day.  13. Combined hyperlipidemia: Dr. Rica Records is following this problem for her.  14. Pallor: She will need a CBC and CMP when she sees her oncologist. 15. Hair loss: Her scalp is somewhat thin, but I'm not sure that there is any difference from her last several visits. I talked with her about Rogaine for Women.   PLAN:  1. Diagnostic: Repeat TFTs, B12, B6, CMP, calcium, PTH, 25-OH vitamin D. Consider BMD. Consider repeating her thyroid US if her  exam changes. 2. Therapeutic: Continue calcitriol at one tablet/capsule, twice weekly. Please take all of her medications, to include her calcitriol, calcium, Welchol, and vitamin D. Continue to take a multivitamin pill and one vitamin B6 capsule daily 3. 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 We need to keep up her intake of calcium and vitamin D. 4. Follow-up: 6 months  Level of Service: This visit lasted in excess of 80 minutes. More than 50% of the visit was devoted to counseling.  Tillman Sers, MD, CDE Adult and Pediatric Endocrinology 07/10/2017 11:06 AM

## 2017-07-10 NOTE — Patient Instructions (Signed)
Follow up visit in 6 months. 

## 2017-07-17 LAB — VITAMIN D 25 HYDROXY (VIT D DEFICIENCY, FRACTURES): VIT D 25 HYDROXY: 42 ng/mL (ref 30–100)

## 2017-07-17 LAB — T4, FREE: FREE T4: 1 ng/dL (ref 0.8–1.8)

## 2017-07-17 LAB — COMPREHENSIVE METABOLIC PANEL
AG RATIO: 1.4 (calc) (ref 1.0–2.5)
ALT: 21 U/L (ref 6–29)
AST: 17 U/L (ref 10–35)
Albumin: 4.3 g/dL (ref 3.6–5.1)
Alkaline phosphatase (APISO): 65 U/L (ref 33–130)
BILIRUBIN TOTAL: 1.3 mg/dL — AB (ref 0.2–1.2)
BUN/Creatinine Ratio: 13 (calc) (ref 6–22)
BUN: 14 mg/dL (ref 7–25)
CALCIUM: 10.5 mg/dL — AB (ref 8.6–10.4)
CO2: 29 mmol/L (ref 20–32)
Chloride: 103 mmol/L (ref 98–110)
Creat: 1.05 mg/dL — ABNORMAL HIGH (ref 0.60–0.93)
GLUCOSE: 96 mg/dL (ref 65–99)
Globulin: 3.1 g/dL (calc) (ref 1.9–3.7)
Potassium: 3.6 mmol/L (ref 3.5–5.3)
Sodium: 142 mmol/L (ref 135–146)
Total Protein: 7.4 g/dL (ref 6.1–8.1)

## 2017-07-17 LAB — TSH: TSH: 1.96 m[IU]/L (ref 0.40–4.50)

## 2017-07-17 LAB — VITAMIN B6: VITAMIN B6: 18.9 ng/mL (ref 2.1–21.7)

## 2017-07-17 LAB — PTH, INTACT AND CALCIUM
CALCIUM: 10.5 mg/dL — AB (ref 8.6–10.4)
PTH: 74 pg/mL — AB (ref 14–64)

## 2017-07-17 LAB — T3, FREE: T3, Free: 2.6 pg/mL (ref 2.3–4.2)

## 2017-07-17 LAB — VITAMIN B12: Vitamin B-12: 403 pg/mL (ref 200–1100)

## 2017-07-19 ENCOUNTER — Encounter (INDEPENDENT_AMBULATORY_CARE_PROVIDER_SITE_OTHER): Payer: Self-pay | Admitting: *Deleted

## 2017-07-31 DIAGNOSIS — R7303 Prediabetes: Secondary | ICD-10-CM | POA: Diagnosis not present

## 2017-07-31 DIAGNOSIS — N3281 Overactive bladder: Secondary | ICD-10-CM | POA: Diagnosis not present

## 2017-07-31 DIAGNOSIS — I10 Essential (primary) hypertension: Secondary | ICD-10-CM | POA: Diagnosis not present

## 2017-07-31 DIAGNOSIS — Z6832 Body mass index (BMI) 32.0-32.9, adult: Secondary | ICD-10-CM | POA: Diagnosis not present

## 2017-07-31 DIAGNOSIS — E559 Vitamin D deficiency, unspecified: Secondary | ICD-10-CM | POA: Diagnosis not present

## 2017-07-31 DIAGNOSIS — E669 Obesity, unspecified: Secondary | ICD-10-CM | POA: Diagnosis not present

## 2017-07-31 DIAGNOSIS — J439 Emphysema, unspecified: Secondary | ICD-10-CM | POA: Diagnosis not present

## 2017-07-31 DIAGNOSIS — E785 Hyperlipidemia, unspecified: Secondary | ICD-10-CM | POA: Diagnosis not present

## 2017-07-31 DIAGNOSIS — J309 Allergic rhinitis, unspecified: Secondary | ICD-10-CM | POA: Diagnosis not present

## 2017-07-31 DIAGNOSIS — Z79899 Other long term (current) drug therapy: Secondary | ICD-10-CM | POA: Diagnosis not present

## 2017-10-09 DIAGNOSIS — C8319 Mantle cell lymphoma, extranodal and solid organ sites: Secondary | ICD-10-CM | POA: Diagnosis not present

## 2017-10-09 DIAGNOSIS — R978 Other abnormal tumor markers: Secondary | ICD-10-CM | POA: Diagnosis not present

## 2017-10-12 DIAGNOSIS — Z8572 Personal history of non-Hodgkin lymphomas: Secondary | ICD-10-CM | POA: Diagnosis not present

## 2017-10-12 DIAGNOSIS — R739 Hyperglycemia, unspecified: Secondary | ICD-10-CM | POA: Diagnosis not present

## 2017-10-12 DIAGNOSIS — R29700 NIHSS score 0: Secondary | ICD-10-CM | POA: Diagnosis not present

## 2017-10-12 DIAGNOSIS — R17 Unspecified jaundice: Secondary | ICD-10-CM | POA: Diagnosis not present

## 2017-10-12 DIAGNOSIS — Z85038 Personal history of other malignant neoplasm of large intestine: Secondary | ICD-10-CM | POA: Diagnosis not present

## 2017-10-12 DIAGNOSIS — R29818 Other symptoms and signs involving the nervous system: Secondary | ICD-10-CM | POA: Diagnosis not present

## 2017-10-12 DIAGNOSIS — I10 Essential (primary) hypertension: Secondary | ICD-10-CM | POA: Diagnosis not present

## 2017-10-12 DIAGNOSIS — N390 Urinary tract infection, site not specified: Secondary | ICD-10-CM | POA: Diagnosis not present

## 2017-10-12 DIAGNOSIS — R4182 Altered mental status, unspecified: Secondary | ICD-10-CM | POA: Diagnosis not present

## 2017-10-12 DIAGNOSIS — N3000 Acute cystitis without hematuria: Secondary | ICD-10-CM | POA: Diagnosis not present

## 2017-10-12 DIAGNOSIS — E860 Dehydration: Secondary | ICD-10-CM | POA: Diagnosis not present

## 2017-10-12 DIAGNOSIS — J449 Chronic obstructive pulmonary disease, unspecified: Secondary | ICD-10-CM | POA: Diagnosis not present

## 2017-10-12 DIAGNOSIS — I639 Cerebral infarction, unspecified: Secondary | ICD-10-CM | POA: Diagnosis not present

## 2017-10-12 DIAGNOSIS — G9341 Metabolic encephalopathy: Secondary | ICD-10-CM | POA: Diagnosis not present

## 2017-10-12 DIAGNOSIS — I6389 Other cerebral infarction: Secondary | ICD-10-CM | POA: Diagnosis not present

## 2017-10-13 DIAGNOSIS — R531 Weakness: Secondary | ICD-10-CM | POA: Diagnosis not present

## 2017-10-13 DIAGNOSIS — G9341 Metabolic encephalopathy: Secondary | ICD-10-CM | POA: Diagnosis not present

## 2017-10-13 DIAGNOSIS — I6523 Occlusion and stenosis of bilateral carotid arteries: Secondary | ICD-10-CM | POA: Diagnosis not present

## 2017-10-13 DIAGNOSIS — N390 Urinary tract infection, site not specified: Secondary | ICD-10-CM | POA: Diagnosis not present

## 2017-10-14 DIAGNOSIS — E039 Hypothyroidism, unspecified: Secondary | ICD-10-CM | POA: Diagnosis not present

## 2017-10-14 DIAGNOSIS — M1991 Primary osteoarthritis, unspecified site: Secondary | ICD-10-CM | POA: Diagnosis not present

## 2017-10-14 DIAGNOSIS — G9341 Metabolic encephalopathy: Secondary | ICD-10-CM | POA: Diagnosis not present

## 2017-10-14 DIAGNOSIS — E785 Hyperlipidemia, unspecified: Secondary | ICD-10-CM | POA: Diagnosis not present

## 2017-10-14 DIAGNOSIS — D509 Iron deficiency anemia, unspecified: Secondary | ICD-10-CM | POA: Diagnosis not present

## 2017-10-14 DIAGNOSIS — Z85038 Personal history of other malignant neoplasm of large intestine: Secondary | ICD-10-CM | POA: Diagnosis not present

## 2017-10-14 DIAGNOSIS — J449 Chronic obstructive pulmonary disease, unspecified: Secondary | ICD-10-CM | POA: Diagnosis not present

## 2017-10-14 DIAGNOSIS — N390 Urinary tract infection, site not specified: Secondary | ICD-10-CM | POA: Diagnosis not present

## 2017-10-14 DIAGNOSIS — I10 Essential (primary) hypertension: Secondary | ICD-10-CM | POA: Diagnosis not present

## 2017-10-20 DIAGNOSIS — J449 Chronic obstructive pulmonary disease, unspecified: Secondary | ICD-10-CM | POA: Diagnosis not present

## 2017-10-20 DIAGNOSIS — N390 Urinary tract infection, site not specified: Secondary | ICD-10-CM | POA: Diagnosis not present

## 2017-10-20 DIAGNOSIS — E039 Hypothyroidism, unspecified: Secondary | ICD-10-CM | POA: Diagnosis not present

## 2017-10-20 DIAGNOSIS — I10 Essential (primary) hypertension: Secondary | ICD-10-CM | POA: Diagnosis not present

## 2017-10-20 DIAGNOSIS — E785 Hyperlipidemia, unspecified: Secondary | ICD-10-CM | POA: Diagnosis not present

## 2017-10-20 DIAGNOSIS — G9341 Metabolic encephalopathy: Secondary | ICD-10-CM | POA: Diagnosis not present

## 2017-10-20 DIAGNOSIS — M1991 Primary osteoarthritis, unspecified site: Secondary | ICD-10-CM | POA: Diagnosis not present

## 2017-10-20 DIAGNOSIS — D509 Iron deficiency anemia, unspecified: Secondary | ICD-10-CM | POA: Diagnosis not present

## 2017-10-20 DIAGNOSIS — Z85038 Personal history of other malignant neoplasm of large intestine: Secondary | ICD-10-CM | POA: Diagnosis not present

## 2017-10-23 ENCOUNTER — Other Ambulatory Visit (INDEPENDENT_AMBULATORY_CARE_PROVIDER_SITE_OTHER): Payer: Self-pay | Admitting: "Endocrinology

## 2017-10-23 DIAGNOSIS — G9341 Metabolic encephalopathy: Secondary | ICD-10-CM | POA: Diagnosis not present

## 2017-10-23 DIAGNOSIS — D509 Iron deficiency anemia, unspecified: Secondary | ICD-10-CM | POA: Diagnosis not present

## 2017-10-23 DIAGNOSIS — J449 Chronic obstructive pulmonary disease, unspecified: Secondary | ICD-10-CM | POA: Diagnosis not present

## 2017-10-23 DIAGNOSIS — E039 Hypothyroidism, unspecified: Secondary | ICD-10-CM | POA: Diagnosis not present

## 2017-10-23 DIAGNOSIS — M1991 Primary osteoarthritis, unspecified site: Secondary | ICD-10-CM | POA: Diagnosis not present

## 2017-10-23 DIAGNOSIS — N390 Urinary tract infection, site not specified: Secondary | ICD-10-CM | POA: Diagnosis not present

## 2017-10-23 DIAGNOSIS — I10 Essential (primary) hypertension: Secondary | ICD-10-CM | POA: Diagnosis not present

## 2017-10-23 DIAGNOSIS — Z85038 Personal history of other malignant neoplasm of large intestine: Secondary | ICD-10-CM | POA: Diagnosis not present

## 2017-10-23 DIAGNOSIS — E785 Hyperlipidemia, unspecified: Secondary | ICD-10-CM | POA: Diagnosis not present

## 2017-10-26 DIAGNOSIS — E785 Hyperlipidemia, unspecified: Secondary | ICD-10-CM | POA: Diagnosis not present

## 2017-10-26 DIAGNOSIS — N390 Urinary tract infection, site not specified: Secondary | ICD-10-CM | POA: Diagnosis not present

## 2017-10-26 DIAGNOSIS — Z85038 Personal history of other malignant neoplasm of large intestine: Secondary | ICD-10-CM | POA: Diagnosis not present

## 2017-10-26 DIAGNOSIS — I10 Essential (primary) hypertension: Secondary | ICD-10-CM | POA: Diagnosis not present

## 2017-10-26 DIAGNOSIS — J449 Chronic obstructive pulmonary disease, unspecified: Secondary | ICD-10-CM | POA: Diagnosis not present

## 2017-10-26 DIAGNOSIS — G9341 Metabolic encephalopathy: Secondary | ICD-10-CM | POA: Diagnosis not present

## 2017-10-26 DIAGNOSIS — D509 Iron deficiency anemia, unspecified: Secondary | ICD-10-CM | POA: Diagnosis not present

## 2017-10-26 DIAGNOSIS — M1991 Primary osteoarthritis, unspecified site: Secondary | ICD-10-CM | POA: Diagnosis not present

## 2017-10-26 DIAGNOSIS — E039 Hypothyroidism, unspecified: Secondary | ICD-10-CM | POA: Diagnosis not present

## 2017-10-28 DIAGNOSIS — J449 Chronic obstructive pulmonary disease, unspecified: Secondary | ICD-10-CM | POA: Diagnosis not present

## 2017-10-28 DIAGNOSIS — G9341 Metabolic encephalopathy: Secondary | ICD-10-CM | POA: Diagnosis not present

## 2017-10-28 DIAGNOSIS — N390 Urinary tract infection, site not specified: Secondary | ICD-10-CM | POA: Diagnosis not present

## 2017-10-28 DIAGNOSIS — D509 Iron deficiency anemia, unspecified: Secondary | ICD-10-CM | POA: Diagnosis not present

## 2017-10-28 DIAGNOSIS — E785 Hyperlipidemia, unspecified: Secondary | ICD-10-CM | POA: Diagnosis not present

## 2017-10-28 DIAGNOSIS — I10 Essential (primary) hypertension: Secondary | ICD-10-CM | POA: Diagnosis not present

## 2017-10-28 DIAGNOSIS — Z85038 Personal history of other malignant neoplasm of large intestine: Secondary | ICD-10-CM | POA: Diagnosis not present

## 2017-10-28 DIAGNOSIS — M1991 Primary osteoarthritis, unspecified site: Secondary | ICD-10-CM | POA: Diagnosis not present

## 2017-10-28 DIAGNOSIS — E039 Hypothyroidism, unspecified: Secondary | ICD-10-CM | POA: Diagnosis not present

## 2017-10-29 DIAGNOSIS — G9341 Metabolic encephalopathy: Secondary | ICD-10-CM | POA: Diagnosis not present

## 2017-10-29 DIAGNOSIS — Z85038 Personal history of other malignant neoplasm of large intestine: Secondary | ICD-10-CM | POA: Diagnosis not present

## 2017-10-29 DIAGNOSIS — J449 Chronic obstructive pulmonary disease, unspecified: Secondary | ICD-10-CM | POA: Diagnosis not present

## 2017-10-29 DIAGNOSIS — M1991 Primary osteoarthritis, unspecified site: Secondary | ICD-10-CM | POA: Diagnosis not present

## 2017-10-29 DIAGNOSIS — E785 Hyperlipidemia, unspecified: Secondary | ICD-10-CM | POA: Diagnosis not present

## 2017-10-29 DIAGNOSIS — N390 Urinary tract infection, site not specified: Secondary | ICD-10-CM | POA: Diagnosis not present

## 2017-10-29 DIAGNOSIS — I10 Essential (primary) hypertension: Secondary | ICD-10-CM | POA: Diagnosis not present

## 2017-10-29 DIAGNOSIS — E039 Hypothyroidism, unspecified: Secondary | ICD-10-CM | POA: Diagnosis not present

## 2017-10-29 DIAGNOSIS — D509 Iron deficiency anemia, unspecified: Secondary | ICD-10-CM | POA: Diagnosis not present

## 2017-10-30 DIAGNOSIS — W19XXXA Unspecified fall, initial encounter: Secondary | ICD-10-CM | POA: Diagnosis not present

## 2017-10-30 DIAGNOSIS — N343 Urethral syndrome, unspecified: Secondary | ICD-10-CM | POA: Diagnosis not present

## 2017-10-30 DIAGNOSIS — Z6830 Body mass index (BMI) 30.0-30.9, adult: Secondary | ICD-10-CM | POA: Diagnosis not present

## 2017-10-30 DIAGNOSIS — R531 Weakness: Secondary | ICD-10-CM | POA: Diagnosis not present

## 2017-10-30 DIAGNOSIS — E669 Obesity, unspecified: Secondary | ICD-10-CM | POA: Diagnosis not present

## 2017-10-30 DIAGNOSIS — Z09 Encounter for follow-up examination after completed treatment for conditions other than malignant neoplasm: Secondary | ICD-10-CM | POA: Diagnosis not present

## 2017-10-30 DIAGNOSIS — I639 Cerebral infarction, unspecified: Secondary | ICD-10-CM | POA: Diagnosis not present

## 2017-10-30 DIAGNOSIS — R5383 Other fatigue: Secondary | ICD-10-CM | POA: Diagnosis not present

## 2017-10-31 DIAGNOSIS — N390 Urinary tract infection, site not specified: Secondary | ICD-10-CM | POA: Diagnosis not present

## 2017-10-31 DIAGNOSIS — E785 Hyperlipidemia, unspecified: Secondary | ICD-10-CM | POA: Diagnosis not present

## 2017-10-31 DIAGNOSIS — I10 Essential (primary) hypertension: Secondary | ICD-10-CM | POA: Diagnosis not present

## 2017-10-31 DIAGNOSIS — Z85038 Personal history of other malignant neoplasm of large intestine: Secondary | ICD-10-CM | POA: Diagnosis not present

## 2017-10-31 DIAGNOSIS — D509 Iron deficiency anemia, unspecified: Secondary | ICD-10-CM | POA: Diagnosis not present

## 2017-10-31 DIAGNOSIS — J449 Chronic obstructive pulmonary disease, unspecified: Secondary | ICD-10-CM | POA: Diagnosis not present

## 2017-10-31 DIAGNOSIS — M1991 Primary osteoarthritis, unspecified site: Secondary | ICD-10-CM | POA: Diagnosis not present

## 2017-10-31 DIAGNOSIS — E039 Hypothyroidism, unspecified: Secondary | ICD-10-CM | POA: Diagnosis not present

## 2017-10-31 DIAGNOSIS — G9341 Metabolic encephalopathy: Secondary | ICD-10-CM | POA: Diagnosis not present

## 2017-11-01 DIAGNOSIS — E785 Hyperlipidemia, unspecified: Secondary | ICD-10-CM | POA: Diagnosis not present

## 2017-11-01 DIAGNOSIS — G9341 Metabolic encephalopathy: Secondary | ICD-10-CM | POA: Diagnosis not present

## 2017-11-01 DIAGNOSIS — J449 Chronic obstructive pulmonary disease, unspecified: Secondary | ICD-10-CM | POA: Diagnosis not present

## 2017-11-01 DIAGNOSIS — M1991 Primary osteoarthritis, unspecified site: Secondary | ICD-10-CM | POA: Diagnosis not present

## 2017-11-01 DIAGNOSIS — N390 Urinary tract infection, site not specified: Secondary | ICD-10-CM | POA: Diagnosis not present

## 2017-11-01 DIAGNOSIS — D509 Iron deficiency anemia, unspecified: Secondary | ICD-10-CM | POA: Diagnosis not present

## 2017-11-01 DIAGNOSIS — I10 Essential (primary) hypertension: Secondary | ICD-10-CM | POA: Diagnosis not present

## 2017-11-01 DIAGNOSIS — E039 Hypothyroidism, unspecified: Secondary | ICD-10-CM | POA: Diagnosis not present

## 2017-11-01 DIAGNOSIS — Z85038 Personal history of other malignant neoplasm of large intestine: Secondary | ICD-10-CM | POA: Diagnosis not present

## 2017-11-02 DIAGNOSIS — G9341 Metabolic encephalopathy: Secondary | ICD-10-CM | POA: Diagnosis not present

## 2017-11-02 DIAGNOSIS — D509 Iron deficiency anemia, unspecified: Secondary | ICD-10-CM | POA: Diagnosis not present

## 2017-11-02 DIAGNOSIS — E785 Hyperlipidemia, unspecified: Secondary | ICD-10-CM | POA: Diagnosis not present

## 2017-11-02 DIAGNOSIS — M1991 Primary osteoarthritis, unspecified site: Secondary | ICD-10-CM | POA: Diagnosis not present

## 2017-11-02 DIAGNOSIS — J449 Chronic obstructive pulmonary disease, unspecified: Secondary | ICD-10-CM | POA: Diagnosis not present

## 2017-11-02 DIAGNOSIS — I10 Essential (primary) hypertension: Secondary | ICD-10-CM | POA: Diagnosis not present

## 2017-11-02 DIAGNOSIS — N390 Urinary tract infection, site not specified: Secondary | ICD-10-CM | POA: Diagnosis not present

## 2017-11-02 DIAGNOSIS — Z85038 Personal history of other malignant neoplasm of large intestine: Secondary | ICD-10-CM | POA: Diagnosis not present

## 2017-11-02 DIAGNOSIS — E039 Hypothyroidism, unspecified: Secondary | ICD-10-CM | POA: Diagnosis not present

## 2017-11-06 DIAGNOSIS — Z85038 Personal history of other malignant neoplasm of large intestine: Secondary | ICD-10-CM | POA: Diagnosis not present

## 2017-11-06 DIAGNOSIS — E039 Hypothyroidism, unspecified: Secondary | ICD-10-CM | POA: Diagnosis not present

## 2017-11-06 DIAGNOSIS — G9341 Metabolic encephalopathy: Secondary | ICD-10-CM | POA: Diagnosis not present

## 2017-11-06 DIAGNOSIS — D509 Iron deficiency anemia, unspecified: Secondary | ICD-10-CM | POA: Diagnosis not present

## 2017-11-06 DIAGNOSIS — M1991 Primary osteoarthritis, unspecified site: Secondary | ICD-10-CM | POA: Diagnosis not present

## 2017-11-06 DIAGNOSIS — J449 Chronic obstructive pulmonary disease, unspecified: Secondary | ICD-10-CM | POA: Diagnosis not present

## 2017-11-06 DIAGNOSIS — N390 Urinary tract infection, site not specified: Secondary | ICD-10-CM | POA: Diagnosis not present

## 2017-11-06 DIAGNOSIS — I10 Essential (primary) hypertension: Secondary | ICD-10-CM | POA: Diagnosis not present

## 2017-11-06 DIAGNOSIS — E785 Hyperlipidemia, unspecified: Secondary | ICD-10-CM | POA: Diagnosis not present

## 2017-11-07 DIAGNOSIS — I10 Essential (primary) hypertension: Secondary | ICD-10-CM | POA: Diagnosis not present

## 2017-11-07 DIAGNOSIS — J449 Chronic obstructive pulmonary disease, unspecified: Secondary | ICD-10-CM | POA: Diagnosis not present

## 2017-11-07 DIAGNOSIS — M1991 Primary osteoarthritis, unspecified site: Secondary | ICD-10-CM | POA: Diagnosis not present

## 2017-11-07 DIAGNOSIS — G9341 Metabolic encephalopathy: Secondary | ICD-10-CM | POA: Diagnosis not present

## 2017-11-07 DIAGNOSIS — Z85038 Personal history of other malignant neoplasm of large intestine: Secondary | ICD-10-CM | POA: Diagnosis not present

## 2017-11-07 DIAGNOSIS — E785 Hyperlipidemia, unspecified: Secondary | ICD-10-CM | POA: Diagnosis not present

## 2017-11-07 DIAGNOSIS — E039 Hypothyroidism, unspecified: Secondary | ICD-10-CM | POA: Diagnosis not present

## 2017-11-07 DIAGNOSIS — D509 Iron deficiency anemia, unspecified: Secondary | ICD-10-CM | POA: Diagnosis not present

## 2017-11-07 DIAGNOSIS — N390 Urinary tract infection, site not specified: Secondary | ICD-10-CM | POA: Diagnosis not present

## 2017-11-09 DIAGNOSIS — J449 Chronic obstructive pulmonary disease, unspecified: Secondary | ICD-10-CM | POA: Diagnosis not present

## 2017-11-09 DIAGNOSIS — E785 Hyperlipidemia, unspecified: Secondary | ICD-10-CM | POA: Diagnosis not present

## 2017-11-09 DIAGNOSIS — Z85038 Personal history of other malignant neoplasm of large intestine: Secondary | ICD-10-CM | POA: Diagnosis not present

## 2017-11-09 DIAGNOSIS — D509 Iron deficiency anemia, unspecified: Secondary | ICD-10-CM | POA: Diagnosis not present

## 2017-11-09 DIAGNOSIS — I10 Essential (primary) hypertension: Secondary | ICD-10-CM | POA: Diagnosis not present

## 2017-11-09 DIAGNOSIS — M1991 Primary osteoarthritis, unspecified site: Secondary | ICD-10-CM | POA: Diagnosis not present

## 2017-11-09 DIAGNOSIS — G9341 Metabolic encephalopathy: Secondary | ICD-10-CM | POA: Diagnosis not present

## 2017-11-09 DIAGNOSIS — N390 Urinary tract infection, site not specified: Secondary | ICD-10-CM | POA: Diagnosis not present

## 2017-11-09 DIAGNOSIS — E039 Hypothyroidism, unspecified: Secondary | ICD-10-CM | POA: Diagnosis not present

## 2017-11-10 DIAGNOSIS — I10 Essential (primary) hypertension: Secondary | ICD-10-CM | POA: Diagnosis not present

## 2017-11-10 DIAGNOSIS — E785 Hyperlipidemia, unspecified: Secondary | ICD-10-CM | POA: Diagnosis not present

## 2017-11-10 DIAGNOSIS — D509 Iron deficiency anemia, unspecified: Secondary | ICD-10-CM | POA: Diagnosis not present

## 2017-11-10 DIAGNOSIS — M1991 Primary osteoarthritis, unspecified site: Secondary | ICD-10-CM | POA: Diagnosis not present

## 2017-11-10 DIAGNOSIS — Z85038 Personal history of other malignant neoplasm of large intestine: Secondary | ICD-10-CM | POA: Diagnosis not present

## 2017-11-10 DIAGNOSIS — J449 Chronic obstructive pulmonary disease, unspecified: Secondary | ICD-10-CM | POA: Diagnosis not present

## 2017-11-10 DIAGNOSIS — E039 Hypothyroidism, unspecified: Secondary | ICD-10-CM | POA: Diagnosis not present

## 2017-11-10 DIAGNOSIS — G9341 Metabolic encephalopathy: Secondary | ICD-10-CM | POA: Diagnosis not present

## 2017-11-10 DIAGNOSIS — N390 Urinary tract infection, site not specified: Secondary | ICD-10-CM | POA: Diagnosis not present

## 2017-11-13 DIAGNOSIS — M1991 Primary osteoarthritis, unspecified site: Secondary | ICD-10-CM | POA: Diagnosis not present

## 2017-11-13 DIAGNOSIS — D509 Iron deficiency anemia, unspecified: Secondary | ICD-10-CM | POA: Diagnosis not present

## 2017-11-13 DIAGNOSIS — I10 Essential (primary) hypertension: Secondary | ICD-10-CM | POA: Diagnosis not present

## 2017-11-13 DIAGNOSIS — E039 Hypothyroidism, unspecified: Secondary | ICD-10-CM | POA: Diagnosis not present

## 2017-11-13 DIAGNOSIS — G9341 Metabolic encephalopathy: Secondary | ICD-10-CM | POA: Diagnosis not present

## 2017-11-13 DIAGNOSIS — N390 Urinary tract infection, site not specified: Secondary | ICD-10-CM | POA: Diagnosis not present

## 2017-11-13 DIAGNOSIS — Z85038 Personal history of other malignant neoplasm of large intestine: Secondary | ICD-10-CM | POA: Diagnosis not present

## 2017-11-13 DIAGNOSIS — E785 Hyperlipidemia, unspecified: Secondary | ICD-10-CM | POA: Diagnosis not present

## 2017-11-13 DIAGNOSIS — J449 Chronic obstructive pulmonary disease, unspecified: Secondary | ICD-10-CM | POA: Diagnosis not present

## 2017-11-16 DIAGNOSIS — I10 Essential (primary) hypertension: Secondary | ICD-10-CM | POA: Diagnosis not present

## 2017-11-16 DIAGNOSIS — D509 Iron deficiency anemia, unspecified: Secondary | ICD-10-CM | POA: Diagnosis not present

## 2017-11-16 DIAGNOSIS — J449 Chronic obstructive pulmonary disease, unspecified: Secondary | ICD-10-CM | POA: Diagnosis not present

## 2017-11-16 DIAGNOSIS — E785 Hyperlipidemia, unspecified: Secondary | ICD-10-CM | POA: Diagnosis not present

## 2017-11-16 DIAGNOSIS — M1991 Primary osteoarthritis, unspecified site: Secondary | ICD-10-CM | POA: Diagnosis not present

## 2017-11-16 DIAGNOSIS — G9341 Metabolic encephalopathy: Secondary | ICD-10-CM | POA: Diagnosis not present

## 2017-11-16 DIAGNOSIS — E039 Hypothyroidism, unspecified: Secondary | ICD-10-CM | POA: Diagnosis not present

## 2017-11-16 DIAGNOSIS — Z85038 Personal history of other malignant neoplasm of large intestine: Secondary | ICD-10-CM | POA: Diagnosis not present

## 2017-11-16 DIAGNOSIS — N390 Urinary tract infection, site not specified: Secondary | ICD-10-CM | POA: Diagnosis not present

## 2017-11-17 DIAGNOSIS — Z85038 Personal history of other malignant neoplasm of large intestine: Secondary | ICD-10-CM | POA: Diagnosis not present

## 2017-11-17 DIAGNOSIS — I10 Essential (primary) hypertension: Secondary | ICD-10-CM | POA: Diagnosis not present

## 2017-11-17 DIAGNOSIS — M1991 Primary osteoarthritis, unspecified site: Secondary | ICD-10-CM | POA: Diagnosis not present

## 2017-11-17 DIAGNOSIS — E039 Hypothyroidism, unspecified: Secondary | ICD-10-CM | POA: Diagnosis not present

## 2017-11-17 DIAGNOSIS — E785 Hyperlipidemia, unspecified: Secondary | ICD-10-CM | POA: Diagnosis not present

## 2017-11-17 DIAGNOSIS — N390 Urinary tract infection, site not specified: Secondary | ICD-10-CM | POA: Diagnosis not present

## 2017-11-17 DIAGNOSIS — G9341 Metabolic encephalopathy: Secondary | ICD-10-CM | POA: Diagnosis not present

## 2017-11-17 DIAGNOSIS — J449 Chronic obstructive pulmonary disease, unspecified: Secondary | ICD-10-CM | POA: Diagnosis not present

## 2017-11-17 DIAGNOSIS — D509 Iron deficiency anemia, unspecified: Secondary | ICD-10-CM | POA: Diagnosis not present

## 2017-11-21 DIAGNOSIS — E785 Hyperlipidemia, unspecified: Secondary | ICD-10-CM | POA: Diagnosis not present

## 2017-11-21 DIAGNOSIS — N390 Urinary tract infection, site not specified: Secondary | ICD-10-CM | POA: Diagnosis not present

## 2017-11-21 DIAGNOSIS — I10 Essential (primary) hypertension: Secondary | ICD-10-CM | POA: Diagnosis not present

## 2017-11-21 DIAGNOSIS — D509 Iron deficiency anemia, unspecified: Secondary | ICD-10-CM | POA: Diagnosis not present

## 2017-11-21 DIAGNOSIS — G9341 Metabolic encephalopathy: Secondary | ICD-10-CM | POA: Diagnosis not present

## 2017-11-21 DIAGNOSIS — M1991 Primary osteoarthritis, unspecified site: Secondary | ICD-10-CM | POA: Diagnosis not present

## 2017-11-21 DIAGNOSIS — J449 Chronic obstructive pulmonary disease, unspecified: Secondary | ICD-10-CM | POA: Diagnosis not present

## 2017-11-21 DIAGNOSIS — E039 Hypothyroidism, unspecified: Secondary | ICD-10-CM | POA: Diagnosis not present

## 2017-11-21 DIAGNOSIS — Z85038 Personal history of other malignant neoplasm of large intestine: Secondary | ICD-10-CM | POA: Diagnosis not present

## 2017-11-22 DIAGNOSIS — E039 Hypothyroidism, unspecified: Secondary | ICD-10-CM | POA: Diagnosis not present

## 2017-11-22 DIAGNOSIS — N390 Urinary tract infection, site not specified: Secondary | ICD-10-CM | POA: Diagnosis not present

## 2017-11-22 DIAGNOSIS — D509 Iron deficiency anemia, unspecified: Secondary | ICD-10-CM | POA: Diagnosis not present

## 2017-11-22 DIAGNOSIS — Z85038 Personal history of other malignant neoplasm of large intestine: Secondary | ICD-10-CM | POA: Diagnosis not present

## 2017-11-22 DIAGNOSIS — J449 Chronic obstructive pulmonary disease, unspecified: Secondary | ICD-10-CM | POA: Diagnosis not present

## 2017-11-22 DIAGNOSIS — E785 Hyperlipidemia, unspecified: Secondary | ICD-10-CM | POA: Diagnosis not present

## 2017-11-22 DIAGNOSIS — G9341 Metabolic encephalopathy: Secondary | ICD-10-CM | POA: Diagnosis not present

## 2017-11-22 DIAGNOSIS — M1991 Primary osteoarthritis, unspecified site: Secondary | ICD-10-CM | POA: Diagnosis not present

## 2017-11-22 DIAGNOSIS — I10 Essential (primary) hypertension: Secondary | ICD-10-CM | POA: Diagnosis not present

## 2017-11-23 DIAGNOSIS — N390 Urinary tract infection, site not specified: Secondary | ICD-10-CM | POA: Diagnosis not present

## 2017-11-23 DIAGNOSIS — Z85038 Personal history of other malignant neoplasm of large intestine: Secondary | ICD-10-CM | POA: Diagnosis not present

## 2017-11-23 DIAGNOSIS — J449 Chronic obstructive pulmonary disease, unspecified: Secondary | ICD-10-CM | POA: Diagnosis not present

## 2017-11-23 DIAGNOSIS — D509 Iron deficiency anemia, unspecified: Secondary | ICD-10-CM | POA: Diagnosis not present

## 2017-11-23 DIAGNOSIS — E785 Hyperlipidemia, unspecified: Secondary | ICD-10-CM | POA: Diagnosis not present

## 2017-11-23 DIAGNOSIS — G9341 Metabolic encephalopathy: Secondary | ICD-10-CM | POA: Diagnosis not present

## 2017-11-23 DIAGNOSIS — E039 Hypothyroidism, unspecified: Secondary | ICD-10-CM | POA: Diagnosis not present

## 2017-11-23 DIAGNOSIS — M1991 Primary osteoarthritis, unspecified site: Secondary | ICD-10-CM | POA: Diagnosis not present

## 2017-11-23 DIAGNOSIS — I10 Essential (primary) hypertension: Secondary | ICD-10-CM | POA: Diagnosis not present

## 2017-11-24 DIAGNOSIS — G9341 Metabolic encephalopathy: Secondary | ICD-10-CM | POA: Diagnosis not present

## 2017-11-24 DIAGNOSIS — N390 Urinary tract infection, site not specified: Secondary | ICD-10-CM | POA: Diagnosis not present

## 2017-11-24 DIAGNOSIS — J449 Chronic obstructive pulmonary disease, unspecified: Secondary | ICD-10-CM | POA: Diagnosis not present

## 2017-11-24 DIAGNOSIS — E785 Hyperlipidemia, unspecified: Secondary | ICD-10-CM | POA: Diagnosis not present

## 2017-11-24 DIAGNOSIS — M1991 Primary osteoarthritis, unspecified site: Secondary | ICD-10-CM | POA: Diagnosis not present

## 2017-11-24 DIAGNOSIS — D509 Iron deficiency anemia, unspecified: Secondary | ICD-10-CM | POA: Diagnosis not present

## 2017-11-24 DIAGNOSIS — Z85038 Personal history of other malignant neoplasm of large intestine: Secondary | ICD-10-CM | POA: Diagnosis not present

## 2017-11-24 DIAGNOSIS — E039 Hypothyroidism, unspecified: Secondary | ICD-10-CM | POA: Diagnosis not present

## 2017-11-24 DIAGNOSIS — I10 Essential (primary) hypertension: Secondary | ICD-10-CM | POA: Diagnosis not present

## 2017-11-27 DIAGNOSIS — M1991 Primary osteoarthritis, unspecified site: Secondary | ICD-10-CM | POA: Diagnosis not present

## 2017-11-27 DIAGNOSIS — E039 Hypothyroidism, unspecified: Secondary | ICD-10-CM | POA: Diagnosis not present

## 2017-11-27 DIAGNOSIS — I10 Essential (primary) hypertension: Secondary | ICD-10-CM | POA: Diagnosis not present

## 2017-11-27 DIAGNOSIS — E785 Hyperlipidemia, unspecified: Secondary | ICD-10-CM | POA: Diagnosis not present

## 2017-11-27 DIAGNOSIS — N390 Urinary tract infection, site not specified: Secondary | ICD-10-CM | POA: Diagnosis not present

## 2017-11-27 DIAGNOSIS — D509 Iron deficiency anemia, unspecified: Secondary | ICD-10-CM | POA: Diagnosis not present

## 2017-11-27 DIAGNOSIS — J449 Chronic obstructive pulmonary disease, unspecified: Secondary | ICD-10-CM | POA: Diagnosis not present

## 2017-11-27 DIAGNOSIS — Z85038 Personal history of other malignant neoplasm of large intestine: Secondary | ICD-10-CM | POA: Diagnosis not present

## 2017-11-27 DIAGNOSIS — G9341 Metabolic encephalopathy: Secondary | ICD-10-CM | POA: Diagnosis not present

## 2017-11-28 DIAGNOSIS — S4991XA Unspecified injury of right shoulder and upper arm, initial encounter: Secondary | ICD-10-CM | POA: Diagnosis not present

## 2017-11-28 DIAGNOSIS — N259 Disorder resulting from impaired renal tubular function, unspecified: Secondary | ICD-10-CM | POA: Diagnosis not present

## 2017-11-28 DIAGNOSIS — R7303 Prediabetes: Secondary | ICD-10-CM | POA: Diagnosis not present

## 2017-11-28 DIAGNOSIS — J439 Emphysema, unspecified: Secondary | ICD-10-CM | POA: Diagnosis not present

## 2017-11-28 DIAGNOSIS — E669 Obesity, unspecified: Secondary | ICD-10-CM | POA: Diagnosis not present

## 2017-11-28 DIAGNOSIS — M25511 Pain in right shoulder: Secondary | ICD-10-CM | POA: Diagnosis not present

## 2017-11-28 DIAGNOSIS — N3281 Overactive bladder: Secondary | ICD-10-CM | POA: Diagnosis not present

## 2017-11-28 DIAGNOSIS — E785 Hyperlipidemia, unspecified: Secondary | ICD-10-CM | POA: Diagnosis not present

## 2017-11-28 DIAGNOSIS — E559 Vitamin D deficiency, unspecified: Secondary | ICD-10-CM | POA: Diagnosis not present

## 2017-11-28 DIAGNOSIS — Z79899 Other long term (current) drug therapy: Secondary | ICD-10-CM | POA: Diagnosis not present

## 2017-11-28 DIAGNOSIS — I10 Essential (primary) hypertension: Secondary | ICD-10-CM | POA: Diagnosis not present

## 2017-11-29 DIAGNOSIS — N390 Urinary tract infection, site not specified: Secondary | ICD-10-CM | POA: Diagnosis not present

## 2017-11-29 DIAGNOSIS — E785 Hyperlipidemia, unspecified: Secondary | ICD-10-CM | POA: Diagnosis not present

## 2017-11-29 DIAGNOSIS — E039 Hypothyroidism, unspecified: Secondary | ICD-10-CM | POA: Diagnosis not present

## 2017-11-29 DIAGNOSIS — J449 Chronic obstructive pulmonary disease, unspecified: Secondary | ICD-10-CM | POA: Diagnosis not present

## 2017-11-29 DIAGNOSIS — M1991 Primary osteoarthritis, unspecified site: Secondary | ICD-10-CM | POA: Diagnosis not present

## 2017-11-29 DIAGNOSIS — Z85038 Personal history of other malignant neoplasm of large intestine: Secondary | ICD-10-CM | POA: Diagnosis not present

## 2017-11-29 DIAGNOSIS — D509 Iron deficiency anemia, unspecified: Secondary | ICD-10-CM | POA: Diagnosis not present

## 2017-11-29 DIAGNOSIS — I10 Essential (primary) hypertension: Secondary | ICD-10-CM | POA: Diagnosis not present

## 2017-11-29 DIAGNOSIS — G9341 Metabolic encephalopathy: Secondary | ICD-10-CM | POA: Diagnosis not present

## 2017-11-30 DIAGNOSIS — Z85038 Personal history of other malignant neoplasm of large intestine: Secondary | ICD-10-CM | POA: Diagnosis not present

## 2017-11-30 DIAGNOSIS — J449 Chronic obstructive pulmonary disease, unspecified: Secondary | ICD-10-CM | POA: Diagnosis not present

## 2017-11-30 DIAGNOSIS — E039 Hypothyroidism, unspecified: Secondary | ICD-10-CM | POA: Diagnosis not present

## 2017-11-30 DIAGNOSIS — I10 Essential (primary) hypertension: Secondary | ICD-10-CM | POA: Diagnosis not present

## 2017-11-30 DIAGNOSIS — N390 Urinary tract infection, site not specified: Secondary | ICD-10-CM | POA: Diagnosis not present

## 2017-11-30 DIAGNOSIS — G9341 Metabolic encephalopathy: Secondary | ICD-10-CM | POA: Diagnosis not present

## 2017-11-30 DIAGNOSIS — M1991 Primary osteoarthritis, unspecified site: Secondary | ICD-10-CM | POA: Diagnosis not present

## 2017-11-30 DIAGNOSIS — E785 Hyperlipidemia, unspecified: Secondary | ICD-10-CM | POA: Diagnosis not present

## 2017-11-30 DIAGNOSIS — D509 Iron deficiency anemia, unspecified: Secondary | ICD-10-CM | POA: Diagnosis not present

## 2017-12-01 DIAGNOSIS — D509 Iron deficiency anemia, unspecified: Secondary | ICD-10-CM | POA: Diagnosis not present

## 2017-12-01 DIAGNOSIS — I10 Essential (primary) hypertension: Secondary | ICD-10-CM | POA: Diagnosis not present

## 2017-12-01 DIAGNOSIS — J449 Chronic obstructive pulmonary disease, unspecified: Secondary | ICD-10-CM | POA: Diagnosis not present

## 2017-12-01 DIAGNOSIS — G9341 Metabolic encephalopathy: Secondary | ICD-10-CM | POA: Diagnosis not present

## 2017-12-01 DIAGNOSIS — E039 Hypothyroidism, unspecified: Secondary | ICD-10-CM | POA: Diagnosis not present

## 2017-12-01 DIAGNOSIS — M1991 Primary osteoarthritis, unspecified site: Secondary | ICD-10-CM | POA: Diagnosis not present

## 2017-12-01 DIAGNOSIS — E785 Hyperlipidemia, unspecified: Secondary | ICD-10-CM | POA: Diagnosis not present

## 2017-12-01 DIAGNOSIS — N390 Urinary tract infection, site not specified: Secondary | ICD-10-CM | POA: Diagnosis not present

## 2017-12-01 DIAGNOSIS — Z85038 Personal history of other malignant neoplasm of large intestine: Secondary | ICD-10-CM | POA: Diagnosis not present

## 2017-12-04 DIAGNOSIS — Z85038 Personal history of other malignant neoplasm of large intestine: Secondary | ICD-10-CM | POA: Diagnosis not present

## 2017-12-04 DIAGNOSIS — G9341 Metabolic encephalopathy: Secondary | ICD-10-CM | POA: Diagnosis not present

## 2017-12-04 DIAGNOSIS — E039 Hypothyroidism, unspecified: Secondary | ICD-10-CM | POA: Diagnosis not present

## 2017-12-04 DIAGNOSIS — I10 Essential (primary) hypertension: Secondary | ICD-10-CM | POA: Diagnosis not present

## 2017-12-04 DIAGNOSIS — J449 Chronic obstructive pulmonary disease, unspecified: Secondary | ICD-10-CM | POA: Diagnosis not present

## 2017-12-04 DIAGNOSIS — N390 Urinary tract infection, site not specified: Secondary | ICD-10-CM | POA: Diagnosis not present

## 2017-12-04 DIAGNOSIS — E785 Hyperlipidemia, unspecified: Secondary | ICD-10-CM | POA: Diagnosis not present

## 2017-12-04 DIAGNOSIS — M1991 Primary osteoarthritis, unspecified site: Secondary | ICD-10-CM | POA: Diagnosis not present

## 2017-12-04 DIAGNOSIS — D509 Iron deficiency anemia, unspecified: Secondary | ICD-10-CM | POA: Diagnosis not present

## 2017-12-05 DIAGNOSIS — E785 Hyperlipidemia, unspecified: Secondary | ICD-10-CM | POA: Diagnosis not present

## 2017-12-05 DIAGNOSIS — E039 Hypothyroidism, unspecified: Secondary | ICD-10-CM | POA: Diagnosis not present

## 2017-12-05 DIAGNOSIS — M1991 Primary osteoarthritis, unspecified site: Secondary | ICD-10-CM | POA: Diagnosis not present

## 2017-12-05 DIAGNOSIS — D509 Iron deficiency anemia, unspecified: Secondary | ICD-10-CM | POA: Diagnosis not present

## 2017-12-05 DIAGNOSIS — G9341 Metabolic encephalopathy: Secondary | ICD-10-CM | POA: Diagnosis not present

## 2017-12-05 DIAGNOSIS — Z85038 Personal history of other malignant neoplasm of large intestine: Secondary | ICD-10-CM | POA: Diagnosis not present

## 2017-12-05 DIAGNOSIS — J449 Chronic obstructive pulmonary disease, unspecified: Secondary | ICD-10-CM | POA: Diagnosis not present

## 2017-12-05 DIAGNOSIS — N390 Urinary tract infection, site not specified: Secondary | ICD-10-CM | POA: Diagnosis not present

## 2017-12-05 DIAGNOSIS — I10 Essential (primary) hypertension: Secondary | ICD-10-CM | POA: Diagnosis not present

## 2017-12-06 DIAGNOSIS — E039 Hypothyroidism, unspecified: Secondary | ICD-10-CM | POA: Diagnosis not present

## 2017-12-06 DIAGNOSIS — N390 Urinary tract infection, site not specified: Secondary | ICD-10-CM | POA: Diagnosis not present

## 2017-12-06 DIAGNOSIS — J449 Chronic obstructive pulmonary disease, unspecified: Secondary | ICD-10-CM | POA: Diagnosis not present

## 2017-12-06 DIAGNOSIS — M1991 Primary osteoarthritis, unspecified site: Secondary | ICD-10-CM | POA: Diagnosis not present

## 2017-12-06 DIAGNOSIS — Z85038 Personal history of other malignant neoplasm of large intestine: Secondary | ICD-10-CM | POA: Diagnosis not present

## 2017-12-06 DIAGNOSIS — E785 Hyperlipidemia, unspecified: Secondary | ICD-10-CM | POA: Diagnosis not present

## 2017-12-06 DIAGNOSIS — G9341 Metabolic encephalopathy: Secondary | ICD-10-CM | POA: Diagnosis not present

## 2017-12-06 DIAGNOSIS — D509 Iron deficiency anemia, unspecified: Secondary | ICD-10-CM | POA: Diagnosis not present

## 2017-12-06 DIAGNOSIS — I10 Essential (primary) hypertension: Secondary | ICD-10-CM | POA: Diagnosis not present

## 2017-12-07 DIAGNOSIS — Z85038 Personal history of other malignant neoplasm of large intestine: Secondary | ICD-10-CM | POA: Diagnosis not present

## 2017-12-07 DIAGNOSIS — D509 Iron deficiency anemia, unspecified: Secondary | ICD-10-CM | POA: Diagnosis not present

## 2017-12-07 DIAGNOSIS — E039 Hypothyroidism, unspecified: Secondary | ICD-10-CM | POA: Diagnosis not present

## 2017-12-07 DIAGNOSIS — E785 Hyperlipidemia, unspecified: Secondary | ICD-10-CM | POA: Diagnosis not present

## 2017-12-07 DIAGNOSIS — M1991 Primary osteoarthritis, unspecified site: Secondary | ICD-10-CM | POA: Diagnosis not present

## 2017-12-07 DIAGNOSIS — I10 Essential (primary) hypertension: Secondary | ICD-10-CM | POA: Diagnosis not present

## 2017-12-07 DIAGNOSIS — N390 Urinary tract infection, site not specified: Secondary | ICD-10-CM | POA: Diagnosis not present

## 2017-12-07 DIAGNOSIS — J449 Chronic obstructive pulmonary disease, unspecified: Secondary | ICD-10-CM | POA: Diagnosis not present

## 2017-12-07 DIAGNOSIS — G9341 Metabolic encephalopathy: Secondary | ICD-10-CM | POA: Diagnosis not present

## 2017-12-14 DIAGNOSIS — E785 Hyperlipidemia, unspecified: Secondary | ICD-10-CM | POA: Diagnosis not present

## 2017-12-14 DIAGNOSIS — G9341 Metabolic encephalopathy: Secondary | ICD-10-CM | POA: Diagnosis not present

## 2017-12-14 DIAGNOSIS — Z85038 Personal history of other malignant neoplasm of large intestine: Secondary | ICD-10-CM | POA: Diagnosis not present

## 2017-12-14 DIAGNOSIS — D509 Iron deficiency anemia, unspecified: Secondary | ICD-10-CM | POA: Diagnosis not present

## 2017-12-14 DIAGNOSIS — E039 Hypothyroidism, unspecified: Secondary | ICD-10-CM | POA: Diagnosis not present

## 2017-12-14 DIAGNOSIS — M1991 Primary osteoarthritis, unspecified site: Secondary | ICD-10-CM | POA: Diagnosis not present

## 2017-12-14 DIAGNOSIS — I10 Essential (primary) hypertension: Secondary | ICD-10-CM | POA: Diagnosis not present

## 2017-12-14 DIAGNOSIS — J449 Chronic obstructive pulmonary disease, unspecified: Secondary | ICD-10-CM | POA: Diagnosis not present

## 2017-12-19 DIAGNOSIS — M1991 Primary osteoarthritis, unspecified site: Secondary | ICD-10-CM | POA: Diagnosis not present

## 2017-12-19 DIAGNOSIS — E785 Hyperlipidemia, unspecified: Secondary | ICD-10-CM | POA: Diagnosis not present

## 2017-12-19 DIAGNOSIS — Z85038 Personal history of other malignant neoplasm of large intestine: Secondary | ICD-10-CM | POA: Diagnosis not present

## 2017-12-19 DIAGNOSIS — J449 Chronic obstructive pulmonary disease, unspecified: Secondary | ICD-10-CM | POA: Diagnosis not present

## 2017-12-19 DIAGNOSIS — I10 Essential (primary) hypertension: Secondary | ICD-10-CM | POA: Diagnosis not present

## 2017-12-19 DIAGNOSIS — D509 Iron deficiency anemia, unspecified: Secondary | ICD-10-CM | POA: Diagnosis not present

## 2017-12-19 DIAGNOSIS — G9341 Metabolic encephalopathy: Secondary | ICD-10-CM | POA: Diagnosis not present

## 2017-12-19 DIAGNOSIS — E039 Hypothyroidism, unspecified: Secondary | ICD-10-CM | POA: Diagnosis not present

## 2017-12-26 DIAGNOSIS — G9341 Metabolic encephalopathy: Secondary | ICD-10-CM | POA: Diagnosis not present

## 2017-12-26 DIAGNOSIS — E785 Hyperlipidemia, unspecified: Secondary | ICD-10-CM | POA: Diagnosis not present

## 2017-12-26 DIAGNOSIS — M1991 Primary osteoarthritis, unspecified site: Secondary | ICD-10-CM | POA: Diagnosis not present

## 2017-12-26 DIAGNOSIS — E039 Hypothyroidism, unspecified: Secondary | ICD-10-CM | POA: Diagnosis not present

## 2017-12-26 DIAGNOSIS — D509 Iron deficiency anemia, unspecified: Secondary | ICD-10-CM | POA: Diagnosis not present

## 2017-12-26 DIAGNOSIS — I10 Essential (primary) hypertension: Secondary | ICD-10-CM | POA: Diagnosis not present

## 2017-12-26 DIAGNOSIS — J449 Chronic obstructive pulmonary disease, unspecified: Secondary | ICD-10-CM | POA: Diagnosis not present

## 2017-12-26 DIAGNOSIS — Z85038 Personal history of other malignant neoplasm of large intestine: Secondary | ICD-10-CM | POA: Diagnosis not present

## 2017-12-27 DIAGNOSIS — Z6830 Body mass index (BMI) 30.0-30.9, adult: Secondary | ICD-10-CM | POA: Diagnosis not present

## 2017-12-27 DIAGNOSIS — E669 Obesity, unspecified: Secondary | ICD-10-CM | POA: Diagnosis not present

## 2017-12-27 DIAGNOSIS — I10 Essential (primary) hypertension: Secondary | ICD-10-CM | POA: Diagnosis not present

## 2017-12-27 DIAGNOSIS — R7303 Prediabetes: Secondary | ICD-10-CM | POA: Diagnosis not present

## 2017-12-27 DIAGNOSIS — R410 Disorientation, unspecified: Secondary | ICD-10-CM | POA: Diagnosis not present

## 2017-12-27 DIAGNOSIS — E785 Hyperlipidemia, unspecified: Secondary | ICD-10-CM | POA: Diagnosis not present

## 2017-12-27 DIAGNOSIS — E559 Vitamin D deficiency, unspecified: Secondary | ICD-10-CM | POA: Diagnosis not present

## 2017-12-27 DIAGNOSIS — Z79899 Other long term (current) drug therapy: Secondary | ICD-10-CM | POA: Diagnosis not present

## 2017-12-27 DIAGNOSIS — N289 Disorder of kidney and ureter, unspecified: Secondary | ICD-10-CM | POA: Diagnosis not present

## 2018-01-02 DIAGNOSIS — I10 Essential (primary) hypertension: Secondary | ICD-10-CM | POA: Diagnosis not present

## 2018-01-02 DIAGNOSIS — E785 Hyperlipidemia, unspecified: Secondary | ICD-10-CM | POA: Diagnosis not present

## 2018-01-02 DIAGNOSIS — E039 Hypothyroidism, unspecified: Secondary | ICD-10-CM | POA: Diagnosis not present

## 2018-01-02 DIAGNOSIS — M1991 Primary osteoarthritis, unspecified site: Secondary | ICD-10-CM | POA: Diagnosis not present

## 2018-01-02 DIAGNOSIS — G9341 Metabolic encephalopathy: Secondary | ICD-10-CM | POA: Diagnosis not present

## 2018-01-02 DIAGNOSIS — Z85038 Personal history of other malignant neoplasm of large intestine: Secondary | ICD-10-CM | POA: Diagnosis not present

## 2018-01-02 DIAGNOSIS — D509 Iron deficiency anemia, unspecified: Secondary | ICD-10-CM | POA: Diagnosis not present

## 2018-01-02 DIAGNOSIS — J449 Chronic obstructive pulmonary disease, unspecified: Secondary | ICD-10-CM | POA: Diagnosis not present

## 2018-01-04 DIAGNOSIS — M1991 Primary osteoarthritis, unspecified site: Secondary | ICD-10-CM | POA: Diagnosis not present

## 2018-01-04 DIAGNOSIS — J449 Chronic obstructive pulmonary disease, unspecified: Secondary | ICD-10-CM | POA: Diagnosis not present

## 2018-01-04 DIAGNOSIS — I10 Essential (primary) hypertension: Secondary | ICD-10-CM | POA: Diagnosis not present

## 2018-01-04 DIAGNOSIS — E039 Hypothyroidism, unspecified: Secondary | ICD-10-CM | POA: Diagnosis not present

## 2018-01-04 DIAGNOSIS — E785 Hyperlipidemia, unspecified: Secondary | ICD-10-CM | POA: Diagnosis not present

## 2018-01-04 DIAGNOSIS — Z85038 Personal history of other malignant neoplasm of large intestine: Secondary | ICD-10-CM | POA: Diagnosis not present

## 2018-01-04 DIAGNOSIS — D509 Iron deficiency anemia, unspecified: Secondary | ICD-10-CM | POA: Diagnosis not present

## 2018-01-04 DIAGNOSIS — G9341 Metabolic encephalopathy: Secondary | ICD-10-CM | POA: Diagnosis not present

## 2018-01-08 ENCOUNTER — Encounter (INDEPENDENT_AMBULATORY_CARE_PROVIDER_SITE_OTHER): Payer: Self-pay | Admitting: "Endocrinology

## 2018-01-08 ENCOUNTER — Ambulatory Visit (INDEPENDENT_AMBULATORY_CARE_PROVIDER_SITE_OTHER): Payer: Medicare PPO | Admitting: "Endocrinology

## 2018-01-08 VITALS — BP 116/72 | HR 80 | Ht 60.0 in | Wt 159.0 lb

## 2018-01-08 DIAGNOSIS — R413 Other amnesia: Secondary | ICD-10-CM

## 2018-01-08 DIAGNOSIS — R5383 Other fatigue: Secondary | ICD-10-CM

## 2018-01-08 DIAGNOSIS — E661 Drug-induced obesity: Secondary | ICD-10-CM

## 2018-01-08 DIAGNOSIS — E211 Secondary hyperparathyroidism, not elsewhere classified: Secondary | ICD-10-CM

## 2018-01-08 DIAGNOSIS — E049 Nontoxic goiter, unspecified: Secondary | ICD-10-CM | POA: Diagnosis not present

## 2018-01-08 DIAGNOSIS — E210 Primary hyperparathyroidism: Secondary | ICD-10-CM | POA: Diagnosis not present

## 2018-01-08 DIAGNOSIS — E559 Vitamin D deficiency, unspecified: Secondary | ICD-10-CM | POA: Diagnosis not present

## 2018-01-08 DIAGNOSIS — E063 Autoimmune thyroiditis: Secondary | ICD-10-CM | POA: Diagnosis not present

## 2018-01-08 LAB — T3, FREE: T3, Free: 2.2 pg/mL — ABNORMAL LOW (ref 2.3–4.2)

## 2018-01-08 LAB — TSH: TSH: 2.08 mIU/L (ref 0.40–4.50)

## 2018-01-08 LAB — T4, FREE: Free T4: 1.1 ng/dL (ref 0.8–1.8)

## 2018-01-08 NOTE — Progress Notes (Signed)
Subjective:  Patient Name: Joy Daniels Date of Birth: [birth year]  MRN: [id number]  Joy Daniels  presents to the office today for follow-up of her secondary hyperparathyroidism, hypocalcemia, vitamin D deficiency, iron deficiency anemia, dysgeusia, dyspepsia, intermittent abnormal TFTs/hypothyroidism, thyroiditis, nontoxic multinodular goiter, s/p colon cancer, and s/p mantle cell lymphoma.  HISTORY OF PRESENT ILLNESS:   Joy Daniels is a 77 y.o. Caucasian woman.  Joy Daniels was accompanied by her daughter, Joy Daniels.   1. The patient was first referred to me on 02/01/05 by her primary care physician, Dr. Micheal Likens, for follow up of toxic nodular goiter. The patient was then 77 years of age.   A.  The patient had been previously evaluated by Dr. Genelle Bal, an adult endocrinologist based in [place]. Dr. London Pepper was performing an outreach clinic at the [facility] in [place], [place]. He evaluated the patient for hyperthyroidism and found that she had a toxic nodular goiter. He arranged for her to have radioactive iodine treatment on 12/13/04 with 24.5 mCi of I-131. When Dr. London Pepper retired, I was asked to handle the outreach clinic for the next several months. At the patient's first visit with me, many of her hyperthyroid symptoms and signs had significantly improved. Past medical history was positive for hypertension, osteoporosis, hip pains, dyspepsia, and GERD. She had a previous injury to her right hand that required surgical repair. Family history was positive for cancer in one sister and a heart attack in another sister. Her brother had diabetes and had a stroke. On physical examination she was hypertensive. She had a 25+ gram goiter. She also had pallor of the nailbeds of her fingers.   B. At her next follow up visit in [place] on 03/29/05, she felt much better. Her energy was much improved. Muscle strength was improving. She was still significantly bothered by her excessive belly hunger that was causing weight  gain. TFTs from 03/22/05 were normal, with a TSH of 1.11, a free T4 of 1.22, and a free T3 of 2.8. I started her on Nexium 40 mg per day. Since I was getting ready to terminate the outreach clinic in [place], I made arrangements for her to be followed at my clinic in [place].   2. During the last 13 years that I have followed her here in [place], we have dealt with several different problems:  A. I followed two aspects of her multinodular goiter, the nodules themselves and her thyroid hormone status.   1. A thyroid ultrasound performed on 10/26/06 showed a 1.5 cm dominant hyperechoic mass in the left upper pole. There were 2 smaller nodules in the right lobe measuring 6.9 and 7.6 mm in diameter. On 12/14/06 a fine needle aspirate was performed on the dominant nodule in the left upper lobe. The pathologists diagnosis was: "Rare benign follicular cells, abundant histiocytes and colloid. No neoplasms identified." A follow up thyroid ultrasound performed on 11/05/07 showed an echogenic nodule in the mid left lobe measuring 1.7 x 1.1 x 0.8 cm. This was not grossly changed from images obtained during the previous fine needle aspirate. There was also a smaller nodule in the left lobe measuring 1.3 x 0.6 cm.  In the inferior aspect of the right lobe there was a nodule measuring 1.1 x 0.7 x 0.5. Her next thyroid ultrasound performed on 01/14/2009 showed a partially calcified nodule in the lower pole of the right lobe measuring 1.8 x 0.6 x 0.8. By report, this nodule had increased somewhat in size. However, because the images from the 2009  study were not available, no direct comparison could be made. The largest nodule in the left lobe measured 1.6 x 1.0 x 0.8., essentially unchanged from the previous study. Since her clinical exam has been stable since 2010 I have not repeated the thyroid US.   2. The patient remained euthyroid through the spring of 2010. In September of 2010 and again in April of 2011 she became  mildly hypothyroid. It appeared that she was having some flare ups of Hashimoto's disease at those times. Her TFTs subsequently normalized on 11/26/10.  B. Because of her known osteoporosis, I wanted to treat her with a bisphosphonate. Since she had previously not tolerated Actonel well, she was reluctant. I finally convinced her to try Boniva, but she later discontinued that medication as well. She was not at all interested in doing Forteo injections. To further try to minimize bone loss, I monitored her bone mineral parameters closely. Up through her visit on 04/17/07 her parameters were normal, but she had many difficulties in taking all of her calcium and vitamin D pills, in part due to constipation. Unfortunately, by 12/03/07 her 25-hydroxy vitamin D dropped to 24.2 and her 1,25- dihydroxy vitamin D dropped to 23.3. It appeared not only was she deficient in vitamin D intake, but her kidneys were also not optimally converting 25-hydroxy vitamin D to 1, 25-dihydroxy hydroxy vitamin D. I prescribed vitamin D, 50,000 IU per week. I also added calcitriol, 0.25 mcg per week. When she took these medications, her bone parameters normalized. In early 2010, however, she had stopped taking these medications. Her PTH rose to 75 and then to 81.8. When she again took her calcium and her two vitamin D preparations, her PTH decreased progressively to 62.1 and then to 33 on 11/26/10. On that date, her serum calcium was 10.2, her 25-hydroxy vitamin D was 32.2, and her 1, 25- dihydroxy vitamin D was 28.2. By then, however, she had stopped taking the vitamin D 50,000 international units weekly because her insurance company would not cover it. Her calcium levels, vitamin D levels, and PTH levels have fluctuated ever since.   C.In 2011 she developed stage IV mantle cell lymphoma of the tonsils, stomach, and digestive tract and was successfully treated with chemotherapy for two years. In late 2014 she developed cancer of the ascending  colon and was successfully treated with surgery.    3. The patient's last [facility] visit was on 07/10/17.   A. In the interim, she had a UTI and sepsis in January and was hospitalized.   B. She had memory problems at the time and the memory problems have persisted. Her distant memory is intact. Her recent memory is much worse. She frequently can't remember much that happens day to day.  C. She also tripped at home and fell, hitting the ground face down. Her balance is also not too good.   Joy Daniels is trying to help her mother avoid sweets.   E. She continues to have urinary incontinence, but not as bad. She is no longer taking any medications for this problem.  F. She shows no signs of recurrence of her mantle cell lymphoma or her colon cancer. She is followed by oncology every 6 months, most recently on 10/12/17. Joy Daniels. Her energy is not too good. She stays cold all the time. She is not walking much.    H. She has not had many attacks of shortness of breath, which she attributes to allergies or COPD. She still  becomes somewhat short of breath if she is too active. She has not needed to use either her inhaler or her nebulizer.    I. 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 She uses a walker when she walks outside the house, for example, to go shopping.    J. She is taking her calcitriol, 0.25 mcg, twice weekly; Citracal with D twice daily; omeprazole 40, once daily; metoprolol ER, 50 mg, once daily; B12, 100 mcg, once daily; KCl ER 10 mEq, twice daily; Losartan 25 mg/day, HCTZ 12.5 mg/day;  ezetimibe, 10 mg/day, Montelukast, 10 mg, once daily. She stopped taking Vitamin D, 50,000 IU weekly because her vitamin D Daniels was high, but she is taking it again now.  She no longer takes [employer].   K. Food still does not taste good. Only sweet things taste good.   L. Her hair is still coming out at times.   4. Pertinent Review of Systems:   Constitutional: The patient feels "lightheaded, wobbly when I walk, very tired, and weak". Eyes: Vision is better since having bilateral cataractectomies, but the vision is not as good in her right eye. She had an eye appointment recently, but no changes were made in her glasses. She does not wear the glasses often.  Mouth: "It works." Mouth is still dry if she does not drink enough.    Neck: She has not had any thyroid pains or inflammation. She occasionally still has left postero-lateral neck pains, as she has had off and on for years. Her range of motion of the cervical spine is much reduced. She no longer has difficulty swallowing unless her mouth gets too dry.    Heart: She is not aware of any palpitations, chest pain, or chest pressure.  Lungs: Her COPD is probably unchanged. She still gets short of breath with minor exertion.  Gastrointestinal: She is not constipated when she takes her Miralax and Benefiber. The patient has no complaints of excessive hunger, acid reflux, upset stomach, stomach aches or pains. Legs: She has had more calf cramps at night since her last visit.  Muscle mass and strength seem normal. There are no complaints of numbness, tingling, or burning. She does not have much edema as long as she takes her HCTZ.  Feet: There are no obvious foot problems. She has occasional edema.  Neuro: Her balance is not good.    PAST MEDICAL, FAMILY, AND SOCIAL HISTORY:  Past Medical History:  Diagnosis Date  . Combined hyperlipidemia   . COPD (chronic obstructive pulmonary disease) ([place])   . Dysgeusia   . GERD (gastroesophageal reflux disease)   . Hypercalcemia   . Hyperparathyroidism , secondary, non-renal ([place])   . Hypertension   . Hypothyroidism, acquired, autoimmune   . Iron deficiency   . Lymphoma, mantle cell, multiple sites ([place])   . Nontoxic multinodular goiter   . Obesity (BMI 30.0-34.9)   . Other osteoporosis   . Pallor   . Thyroiditis, autoimmune   . Toxic  nodular goiter   . Vitamin D deficiency disease     Family History  Problem Relation Age of Onset  . Diabetes Father   . Cancer Sister   . Heart disease Sister      Current Outpatient Medications:  .  calcitRIOL (ROCALTROL) 0.25 MCG capsule, TAKE 1 CAPSULE TWICE WEEKLY, Disp: 25 capsule, Rfl: 3 .  ezetimibe (ZETIA) 10 MG tablet, Take 10 mg by mouth daily., Disp: ,  Rfl:  .  hydrochlorothiazide (MICROZIDE) 12.5 MG capsule, Take 12.5 mg by mouth daily., Disp: , Rfl:  .  losartan (COZAAR) 25 MG tablet, Take 25 mg by mouth daily., Disp: , Rfl:  .  metoprolol tartrate (LOPRESSOR) 50 MG tablet, Take 50 mg by mouth daily., Disp: , Rfl: 0 .  montelukast (SINGULAIR) 10 MG tablet, Take 10 mg by mouth at bedtime., Disp: , Rfl:  .  potassium chloride (K-DUR,KLOR-CON) 10 MEQ tablet, , Disp: , Rfl:  .  Vitamin D, Ergocalciferol, (DRISDOL) 50000 units CAPS capsule, , Disp: , Rfl:  .  colesevelam (WELCHOL) 625 MG tablet, Take by mouth 2 (two) times daily with a meal. 3 tablest bid, Disp: , Rfl:  .  fesoterodine (TOVIAZ) 4 MG TB24 tablet, Take 4 mg by mouth daily., Disp: , Rfl:  .  omeprazole (PRILOSEC) 10 MG capsule, Take 10 mg by mouth daily.  , Disp: , Rfl:   Allergies as of 01/08/2018 - Review Complete 01/08/2018  Allergen Reaction Noted  . Codeine  01/27/2011    1. Work and Family: Her family is supportive. Joy Daniels lives with her husband, daughter Joy Daniels, and three adult grandsons.    2. Activities: She is trying to walk in the house. 3. Smoking, alcohol, or drugs: None 4. Primary Care Provider: Dr. Gae Bon B. Uppin  REVIEW OF SYSTEMS: There are no other significant problems involving Joy Daniels's other body systems.   Objective:  Vital Signs:  BP 116/72   Pulse 80   Ht 5' (1.524 m)   Wt 159 lb (72.1 kg)   BMI 31.05 kg/m     Ht Readings from Last 3 Encounters:  01/08/18 5' (1.524 m)  10/12/10 5' (1.524 m)   Wt Readings from Last 3 Encounters:  01/08/18 159 lb (72.1 kg)  07/10/17 163 lb  4.8 oz (74.1 kg)  01/05/17 166 lb 3.2 oz (75.4 kg)   PHYSICAL EXAM:  Constitutional: The patient looks good today. She has lost 4 pounds since her last visit. She is alert, oriented, and quite lucid. She was not comfortable with her memory at the start of the visit, but later remembered items better and cracked jokes. Her sense of humor and affect are very normal. It is always a pleasure to see her. Face: Her face appears very lined and worn. She looks about 17 years older than her chronologic age.  Eyes: There is no obvious arcus or proptosis. Moisture appears normal. She has a slight droop of both eyelids, a bit more noticeable on the right.   Mouth: The oropharynx is normal. The tongue appears normal. Oral moisture is somewhat low.  Neck: The neck appears to be visibly normal. No carotid bruits are noted. The thyroid gland is low-lying. The thyroid gland is normal at about 18 grams in size.  Both lobes are normal in size today. The consistency of the thyroid gland is normal. There is no thyroid tenderness.  Lungs: The lungs are clear to auscultation. Air movement is good. Heart: Heart rate and rhythm are regular. Heart sounds S1 and S2 are normal. I did not appreciate any pathologic cardiac murmurs. Abdomen: The abdomen is enlarged. Bowel sounds are normal. There is no obvious hepatomegaly, splenomegaly, or other mass effect.  Arms: Muscle size and bulk are normal for age. Hands: There is no obvious tremor. Phalangeal and metacarpophalangeal joints are normal. Palmar muscles are normal. Palmar skin is normal. Palmar moisture is also normal. Her nail pallor has recurred.  Legs: Muscles appear normal  for age. She has no edema in the legs today. Neurologic: Strength is fairly normal for age in both the upper and lower extremities. Muscle tone is normal. Sensation to touch is normal in both legs.      LAB DATA:   Labs 07/10/17: TSH 1.96, free T4 1.0, free T3 2.6; CMP normal, except calcium 10.5;  25-OH vitamin D 46; PTH 74 (14-64), vitamin B12 403 (ref 200-1,100), vitamin B6 18.9 (ref 2.1-21.7)  Labs 01/05/17: PTH 71 (ref 14-64), calcium 10.3, 25-OH vitamin D 58, 1,25-dihydroxy vitamin D 56 (ref 18-72)  Labs 10/04/16: Surgical pathology: Peripheral blood flow cytometry: Relative larger amount of T cells than B cells and reversal of the CD4:CD8 ratio  Labs 06/24/16: TSH 2.36, free T4 0,5, free T3 2.5; CMP normal, except for bilirubin 1.6 and ALT 40; 25-OH vitamin D 42  Labs 12/22/15: TSH 2.66, free T4 1.2, free T3 2.4; CMP normal except for AST 39 and ALT 48; PTH 79 (ref 14-64), calcium 10.0,  25-OH vitamin D 47, 1,25-dihydroxy vitamin d 40 (ref 18.72)  Labs: 11/18/14: 25-OH vitamin D 32.9, calcium 10.1; ALT 42; cholesterol 237, triglycerides 290, HDL 46, LDL 133  Labs 08/14/14: 25-OH vitamin D 31.9, calcium 10.6; ALT 33; TSH 3.47   Labs 11/05/13: TSH 2.902, free T4 1.06, free T3 2.5; 1,25-dihydroxy vitamin D 39; 25-hydroxy vitamin D 47, PTH 72, calcium 10.1  Labs 06/11/13: 1,25-dihydroxy vitamin D 39, 25-hydroxy vitamin D 47, PTH 72, calcium 10.1 ; TSH 2.901, free T4 1.06, free T3  2.5  Labs 12/10/12: Hgb 11.1, Hct 33.6%; TSH 2.964, free T4 1.06, free T3 2.4; 25-hydroxy vitamin D 37, PTH 89.4, calcium 9.9; CMP normal  Labs 06/11/12: CMP was normal, to include a calcium of 9.9; TSH 2.195,  free T4 0.98, free T3 2.6; PTH 102.3, 25-hydroxy vitamin D 29, 1,25-dihydroxy vitamin D 57.  Labs 11/17/11: PTH 70.9,calcium 10.0, 25-hydroxy vitamin D 40, 1,25-dihydroxy vitamin D 67, TSH 2.312. Free T4  0.97, Free T3 2.4  Labs 05/19/11: PTH 64, calcium 10.4, 25-hydroxy vitamin D 36, 1,25-dihydroxy vitamin D 47, TSH 2.497, free T4 1.04, free T3 2.6   Assessment and Plan:   ASSESSMENT:  1. Obesity: The patient had lost some weight in the past 6 months. Food does not taste good.  She needs to walk more.  2. Vitamin D deficiency: Patient's 25-hydroxy vitamin D levels in April 2017, October 2017, April  2018, and in October 2018 were normal. Her 1,25-dihydroxy vitamin D Daniels in April 2017 and April 2018 were also normal. The combination of oral vitamin D and oral calcitriol is working successfully for her.  3. Thyroiditis/goiter: Her Hashimoto's disease was quiescent at her last 2 visits and is quiescent today. Her thyroid gland is again within normal limits for size today. The episodic "tenderness" that she has had previously and the process of waxing and waning of thyroid gland size are c/w thyroiditis.  She was euthyroid again in April and October 2017 and in October 2018. 4. Dyspepsia: She is doing better on omeprazole. 5. Hyperparathyroidism, secondary to calcium and vitamin D intake deficiency: Her PTH has been mildly elevated for several years, but her calciums have been in the upper quartile of the normal range. Her calcium in October was at the upper end of the normal range. It is possible that she could be developing tertiary hyperparathyroidism. We will follow this problem over time.  6. Dysgeusia: Her dysgeusia improved after resuming her Centrum Silver. She might benefit from also  taking a B complex vitamin pill daily.  7. Fatigue: Patient's fatigue is still present. She needs to walk more.   8. Lymphoma: Her lymphoma is in remission, which is wonderful.  9. Colon cancer: Her colon cancer is also in remission, which is also wonderful.  11. Calf cramps and restless legs:  These problems have recurred. I again  recommended that she stretch the calves before going to bed.  12. Memory problems: She may be hypothyroid now.  13. Combined hyperlipidemia: Dr. Rica Records is following this problem for her.  14. Pallor: She will need a CBC and CMP when she sees her oncologist. 15. Hair loss: Her scalp is somewhat thin, but I'm not sure that there is any difference from her last several visits. I have talked with her about [facility] in the past.   PLAN:  1. Diagnostic: Repeat TFTs, calcium,  PTH, 25-OH vitamin D. Consider BMD. Consider repeating her thyroid US if her exam changes. 2. Therapeutic: Continue calcitriol at one tablet/capsule, twice weekly. Please take all of her medications, to include her calcitriol, calcium, and vitamin D. Continue to take a multivitamin pill and one vitamin B6 capsule daily 3. 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 We need to keep up her intake of calcium and vitamin D. Unfortunately, if her PTH function worsens she might need parathyroid surgery. 4. Follow-up: 6 months  Daniels of Service: This visit lasted in excess of 85 minutes. More than 50% of the visit was devoted to counseling.  Tillman Sers, MD, CDE Adult and Pediatric Endocrinology 01/08/2018 3:07 PM

## 2018-01-08 NOTE — Patient Instructions (Signed)
Follow up visit in 6 months. 

## 2018-01-09 DIAGNOSIS — J449 Chronic obstructive pulmonary disease, unspecified: Secondary | ICD-10-CM | POA: Diagnosis not present

## 2018-01-09 DIAGNOSIS — Z85038 Personal history of other malignant neoplasm of large intestine: Secondary | ICD-10-CM | POA: Diagnosis not present

## 2018-01-09 DIAGNOSIS — E039 Hypothyroidism, unspecified: Secondary | ICD-10-CM | POA: Diagnosis not present

## 2018-01-09 DIAGNOSIS — E785 Hyperlipidemia, unspecified: Secondary | ICD-10-CM | POA: Diagnosis not present

## 2018-01-09 DIAGNOSIS — G9341 Metabolic encephalopathy: Secondary | ICD-10-CM | POA: Diagnosis not present

## 2018-01-09 DIAGNOSIS — M1991 Primary osteoarthritis, unspecified site: Secondary | ICD-10-CM | POA: Diagnosis not present

## 2018-01-09 DIAGNOSIS — I10 Essential (primary) hypertension: Secondary | ICD-10-CM | POA: Diagnosis not present

## 2018-01-09 DIAGNOSIS — D509 Iron deficiency anemia, unspecified: Secondary | ICD-10-CM | POA: Diagnosis not present

## 2018-01-09 LAB — PTH, INTACT AND CALCIUM
Calcium: 10.9 mg/dL — ABNORMAL HIGH (ref 8.6–10.4)
PTH: 68 pg/mL — ABNORMAL HIGH (ref 14–64)

## 2018-01-09 LAB — VITAMIN D 25 HYDROXY (VIT D DEFICIENCY, FRACTURES): Vit D, 25-Hydroxy: 37 ng/mL (ref 30–100)

## 2018-01-11 DIAGNOSIS — M1991 Primary osteoarthritis, unspecified site: Secondary | ICD-10-CM | POA: Diagnosis not present

## 2018-01-11 DIAGNOSIS — E039 Hypothyroidism, unspecified: Secondary | ICD-10-CM | POA: Diagnosis not present

## 2018-01-11 DIAGNOSIS — Z85038 Personal history of other malignant neoplasm of large intestine: Secondary | ICD-10-CM | POA: Diagnosis not present

## 2018-01-11 DIAGNOSIS — D509 Iron deficiency anemia, unspecified: Secondary | ICD-10-CM | POA: Diagnosis not present

## 2018-01-11 DIAGNOSIS — E785 Hyperlipidemia, unspecified: Secondary | ICD-10-CM | POA: Diagnosis not present

## 2018-01-11 DIAGNOSIS — J449 Chronic obstructive pulmonary disease, unspecified: Secondary | ICD-10-CM | POA: Diagnosis not present

## 2018-01-11 DIAGNOSIS — I10 Essential (primary) hypertension: Secondary | ICD-10-CM | POA: Diagnosis not present

## 2018-01-11 DIAGNOSIS — G9341 Metabolic encephalopathy: Secondary | ICD-10-CM | POA: Diagnosis not present

## 2018-01-12 DIAGNOSIS — K449 Diaphragmatic hernia without obstruction or gangrene: Secondary | ICD-10-CM | POA: Diagnosis not present

## 2018-01-12 DIAGNOSIS — J181 Lobar pneumonia, unspecified organism: Secondary | ICD-10-CM | POA: Diagnosis not present

## 2018-01-12 DIAGNOSIS — R05 Cough: Secondary | ICD-10-CM | POA: Diagnosis not present

## 2018-01-12 DIAGNOSIS — R000 Tachycardia, unspecified: Secondary | ICD-10-CM | POA: Diagnosis not present

## 2018-01-12 DIAGNOSIS — R4182 Altered mental status, unspecified: Secondary | ICD-10-CM | POA: Diagnosis not present

## 2018-01-12 DIAGNOSIS — A419 Sepsis, unspecified organism: Secondary | ICD-10-CM | POA: Diagnosis not present

## 2018-01-13 DIAGNOSIS — M1990 Unspecified osteoarthritis, unspecified site: Secondary | ICD-10-CM | POA: Diagnosis not present

## 2018-01-13 DIAGNOSIS — J440 Chronic obstructive pulmonary disease with acute lower respiratory infection: Secondary | ICD-10-CM | POA: Diagnosis not present

## 2018-01-13 DIAGNOSIS — R0902 Hypoxemia: Secondary | ICD-10-CM | POA: Diagnosis not present

## 2018-01-13 DIAGNOSIS — R000 Tachycardia, unspecified: Secondary | ICD-10-CM | POA: Diagnosis not present

## 2018-01-13 DIAGNOSIS — J189 Pneumonia, unspecified organism: Secondary | ICD-10-CM | POA: Diagnosis not present

## 2018-01-13 DIAGNOSIS — I10 Essential (primary) hypertension: Secondary | ICD-10-CM | POA: Diagnosis not present

## 2018-01-13 DIAGNOSIS — A419 Sepsis, unspecified organism: Secondary | ICD-10-CM | POA: Diagnosis not present

## 2018-01-13 DIAGNOSIS — R509 Fever, unspecified: Secondary | ICD-10-CM | POA: Diagnosis not present

## 2018-01-13 DIAGNOSIS — G9341 Metabolic encephalopathy: Secondary | ICD-10-CM | POA: Diagnosis not present

## 2018-01-13 DIAGNOSIS — J181 Lobar pneumonia, unspecified organism: Secondary | ICD-10-CM | POA: Diagnosis not present

## 2018-01-13 DIAGNOSIS — R05 Cough: Secondary | ICD-10-CM | POA: Diagnosis not present

## 2018-01-13 DIAGNOSIS — D649 Anemia, unspecified: Secondary | ICD-10-CM | POA: Diagnosis not present

## 2018-01-13 DIAGNOSIS — I712 Thoracic aortic aneurysm, without rupture: Secondary | ICD-10-CM | POA: Diagnosis not present

## 2018-01-13 DIAGNOSIS — R4182 Altered mental status, unspecified: Secondary | ICD-10-CM | POA: Diagnosis not present

## 2018-01-13 DIAGNOSIS — K449 Diaphragmatic hernia without obstruction or gangrene: Secondary | ICD-10-CM | POA: Diagnosis not present

## 2018-01-14 DIAGNOSIS — R4182 Altered mental status, unspecified: Secondary | ICD-10-CM | POA: Diagnosis not present

## 2018-01-14 DIAGNOSIS — R509 Fever, unspecified: Secondary | ICD-10-CM | POA: Diagnosis not present

## 2018-01-14 DIAGNOSIS — A419 Sepsis, unspecified organism: Secondary | ICD-10-CM | POA: Diagnosis not present

## 2018-01-14 DIAGNOSIS — J189 Pneumonia, unspecified organism: Secondary | ICD-10-CM | POA: Diagnosis not present

## 2018-01-15 ENCOUNTER — Other Ambulatory Visit (INDEPENDENT_AMBULATORY_CARE_PROVIDER_SITE_OTHER): Payer: Self-pay | Admitting: *Deleted

## 2018-01-15 ENCOUNTER — Telehealth (INDEPENDENT_AMBULATORY_CARE_PROVIDER_SITE_OTHER): Payer: Self-pay | Admitting: *Deleted

## 2018-01-15 DIAGNOSIS — J189 Pneumonia, unspecified organism: Secondary | ICD-10-CM | POA: Diagnosis not present

## 2018-01-15 DIAGNOSIS — A419 Sepsis, unspecified organism: Secondary | ICD-10-CM | POA: Diagnosis not present

## 2018-01-15 DIAGNOSIS — R4182 Altered mental status, unspecified: Secondary | ICD-10-CM | POA: Diagnosis not present

## 2018-01-15 DIAGNOSIS — E042 Nontoxic multinodular goiter: Secondary | ICD-10-CM

## 2018-01-15 DIAGNOSIS — R509 Fever, unspecified: Secondary | ICD-10-CM | POA: Diagnosis not present

## 2018-01-15 DIAGNOSIS — E0520 Thyrotoxicosis with toxic multinodular goiter without thyrotoxic crisis or storm: Secondary | ICD-10-CM

## 2018-01-15 NOTE — Telephone Encounter (Signed)
Spoke to daughter, [name], advised that per Dr. Tobe Sos Joy Daniels's thyroid tests are normal. Her calcium is higher and her PTH is a bit lower. Her 25-OH vitamin D is within normal, but lower. Clinical staff: Please order a repeat calcium, PTH, and 1,25-dihydroxy vitamin D in one month.   Labs are in portal and U/S was ordered. I would suggest scheduling the U/S on a day in 1 month where she can also come and do her labs.

## 2018-01-24 ENCOUNTER — Ambulatory Visit
Admission: RE | Admit: 2018-01-24 | Discharge: 2018-01-24 | Disposition: A | Discharge status: Home / Self Care | Payer: Medicare PPO | Source: Ambulatory Visit | Attending: "Endocrinology | Admitting: "Endocrinology

## 2018-01-24 DIAGNOSIS — E042 Nontoxic multinodular goiter: Secondary | ICD-10-CM

## 2018-02-07 DIAGNOSIS — Z09 Encounter for follow-up examination after completed treatment for conditions other than malignant neoplasm: Secondary | ICD-10-CM | POA: Diagnosis not present

## 2018-02-07 DIAGNOSIS — I719 Aortic aneurysm of unspecified site, without rupture: Secondary | ICD-10-CM | POA: Diagnosis not present

## 2018-02-07 DIAGNOSIS — Z6830 Body mass index (BMI) 30.0-30.9, adult: Secondary | ICD-10-CM | POA: Diagnosis not present

## 2018-02-07 DIAGNOSIS — R2689 Other abnormalities of gait and mobility: Secondary | ICD-10-CM | POA: Diagnosis not present

## 2018-02-07 DIAGNOSIS — R413 Other amnesia: Secondary | ICD-10-CM | POA: Diagnosis not present

## 2018-02-07 DIAGNOSIS — Z79899 Other long term (current) drug therapy: Secondary | ICD-10-CM | POA: Diagnosis not present

## 2018-02-07 DIAGNOSIS — E669 Obesity, unspecified: Secondary | ICD-10-CM | POA: Diagnosis not present

## 2018-02-07 DIAGNOSIS — Z8701 Personal history of pneumonia (recurrent): Secondary | ICD-10-CM | POA: Diagnosis not present

## 2018-02-07 DIAGNOSIS — W19XXXA Unspecified fall, initial encounter: Secondary | ICD-10-CM | POA: Diagnosis not present

## 2018-02-23 DIAGNOSIS — R413 Other amnesia: Secondary | ICD-10-CM | POA: Diagnosis not present

## 2018-02-23 DIAGNOSIS — R5383 Other fatigue: Secondary | ICD-10-CM | POA: Diagnosis not present

## 2018-02-23 DIAGNOSIS — E0520 Thyrotoxicosis with toxic multinodular goiter without thyrotoxic crisis or storm: Secondary | ICD-10-CM | POA: Diagnosis not present

## 2018-02-23 DIAGNOSIS — Z0000 Encounter for general adult medical examination without abnormal findings: Secondary | ICD-10-CM | POA: Diagnosis not present

## 2018-02-23 DIAGNOSIS — E063 Autoimmune thyroiditis: Secondary | ICD-10-CM | POA: Diagnosis not present

## 2018-02-23 DIAGNOSIS — E785 Hyperlipidemia, unspecified: Secondary | ICD-10-CM | POA: Diagnosis not present

## 2018-02-23 DIAGNOSIS — E049 Nontoxic goiter, unspecified: Secondary | ICD-10-CM | POA: Diagnosis not present

## 2018-02-23 DIAGNOSIS — N959 Unspecified menopausal and perimenopausal disorder: Secondary | ICD-10-CM | POA: Diagnosis not present

## 2018-02-23 DIAGNOSIS — E559 Vitamin D deficiency, unspecified: Secondary | ICD-10-CM | POA: Diagnosis not present

## 2018-02-23 DIAGNOSIS — Z9181 History of falling: Secondary | ICD-10-CM | POA: Diagnosis not present

## 2018-02-23 DIAGNOSIS — E211 Secondary hyperparathyroidism, not elsewhere classified: Secondary | ICD-10-CM | POA: Diagnosis not present

## 2018-02-23 DIAGNOSIS — Z1231 Encounter for screening mammogram for malignant neoplasm of breast: Secondary | ICD-10-CM | POA: Diagnosis not present

## 2018-02-23 DIAGNOSIS — Z1331 Encounter for screening for depression: Secondary | ICD-10-CM | POA: Diagnosis not present

## 2018-02-23 DIAGNOSIS — Z1339 Encounter for screening examination for other mental health and behavioral disorders: Secondary | ICD-10-CM | POA: Diagnosis not present

## 2018-02-23 DIAGNOSIS — Z139 Encounter for screening, unspecified: Secondary | ICD-10-CM | POA: Diagnosis not present

## 2018-02-23 DIAGNOSIS — Z6830 Body mass index (BMI) 30.0-30.9, adult: Secondary | ICD-10-CM | POA: Diagnosis not present

## 2018-02-26 LAB — PTH, INTACT AND CALCIUM
CALCIUM: 10.9 mg/dL — AB (ref 8.6–10.4)
PTH: 65 pg/mL — ABNORMAL HIGH (ref 14–64)

## 2018-02-26 LAB — VITAMIN D 1,25 DIHYDROXY
Vitamin D 1, 25 (OH)2 Total: 45 pg/mL (ref 18–72)
Vitamin D2 1, 25 (OH)2: 20 pg/mL
Vitamin D3 1, 25 (OH)2: 25 pg/mL

## 2018-03-02 ENCOUNTER — Encounter (INDEPENDENT_AMBULATORY_CARE_PROVIDER_SITE_OTHER): Payer: Self-pay | Admitting: *Deleted

## 2018-03-20 DIAGNOSIS — Z1231 Encounter for screening mammogram for malignant neoplasm of breast: Secondary | ICD-10-CM | POA: Diagnosis not present

## 2018-03-28 DIAGNOSIS — Z79899 Other long term (current) drug therapy: Secondary | ICD-10-CM | POA: Diagnosis not present

## 2018-03-28 DIAGNOSIS — E785 Hyperlipidemia, unspecified: Secondary | ICD-10-CM | POA: Diagnosis not present

## 2018-03-28 DIAGNOSIS — E559 Vitamin D deficiency, unspecified: Secondary | ICD-10-CM | POA: Diagnosis not present

## 2018-03-28 DIAGNOSIS — I719 Aortic aneurysm of unspecified site, without rupture: Secondary | ICD-10-CM | POA: Diagnosis not present

## 2018-03-28 DIAGNOSIS — Z1339 Encounter for screening examination for other mental health and behavioral disorders: Secondary | ICD-10-CM | POA: Diagnosis not present

## 2018-03-28 DIAGNOSIS — I10 Essential (primary) hypertension: Secondary | ICD-10-CM | POA: Diagnosis not present

## 2018-03-28 DIAGNOSIS — R413 Other amnesia: Secondary | ICD-10-CM | POA: Diagnosis not present

## 2018-03-28 DIAGNOSIS — Z6830 Body mass index (BMI) 30.0-30.9, adult: Secondary | ICD-10-CM | POA: Diagnosis not present

## 2018-04-02 DIAGNOSIS — H04123 Dry eye syndrome of bilateral lacrimal glands: Secondary | ICD-10-CM | POA: Diagnosis not present

## 2018-04-02 DIAGNOSIS — H26493 Other secondary cataract, bilateral: Secondary | ICD-10-CM | POA: Diagnosis not present

## 2018-06-06 IMAGING — US US THYROID
1 series · 13 of 25 positions shown · non-contrast
Comparison: 01/14/2009

CLINICAL DATA: Thyroid nodules

EXAM:
THYROID ULTRASOUND
TECHNIQUE: Ultrasound examination of the thyroid gland and adjacent soft
tissues was performed.

[Series 1: us thyroid · 0.06mm/px · 13 of 57 slices shown]
[im 1/57]
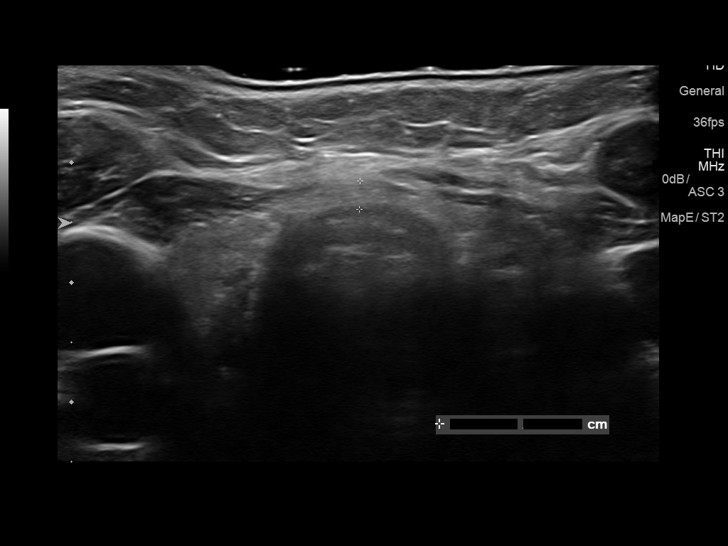
[im 5/57]
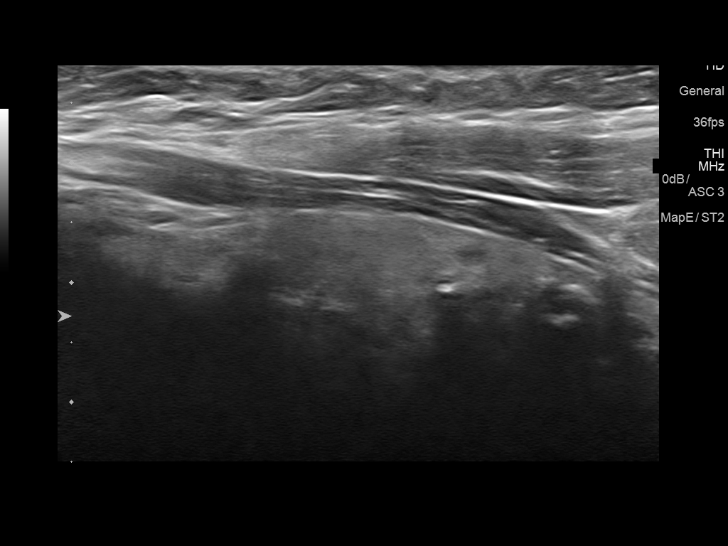
[im 10/57]
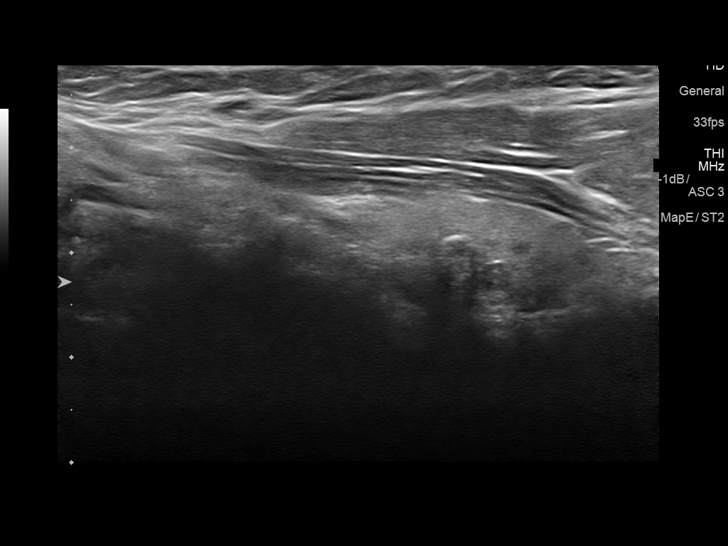
[im 15/57]
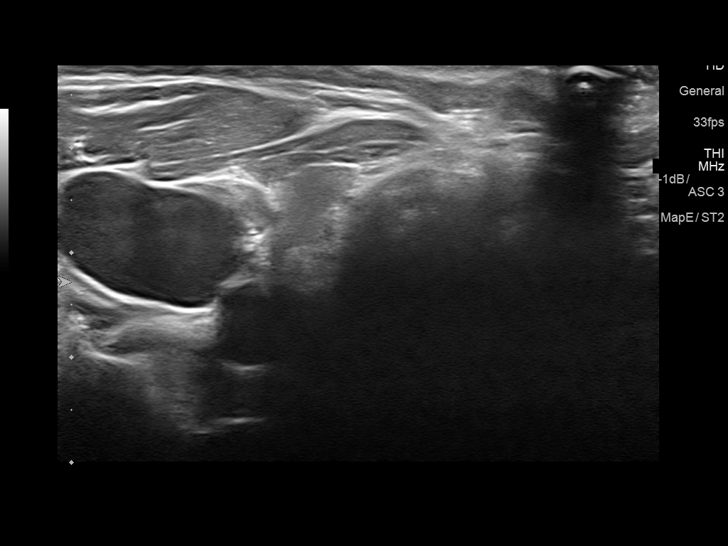
[im 19/57]
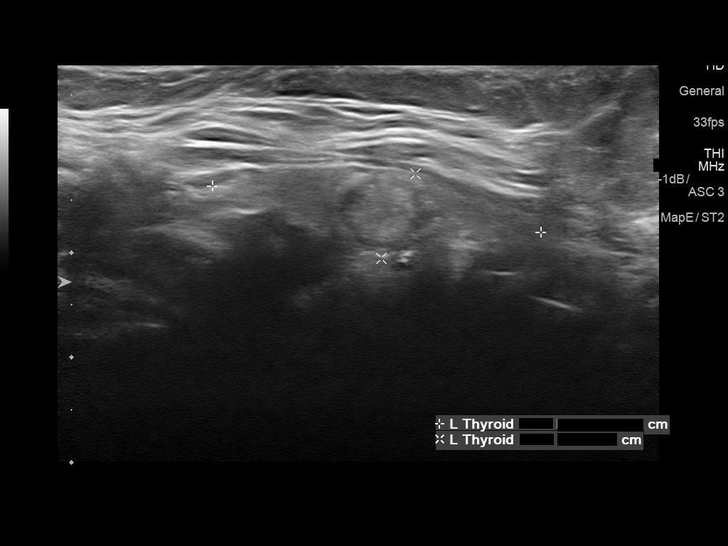
[im 24/57]
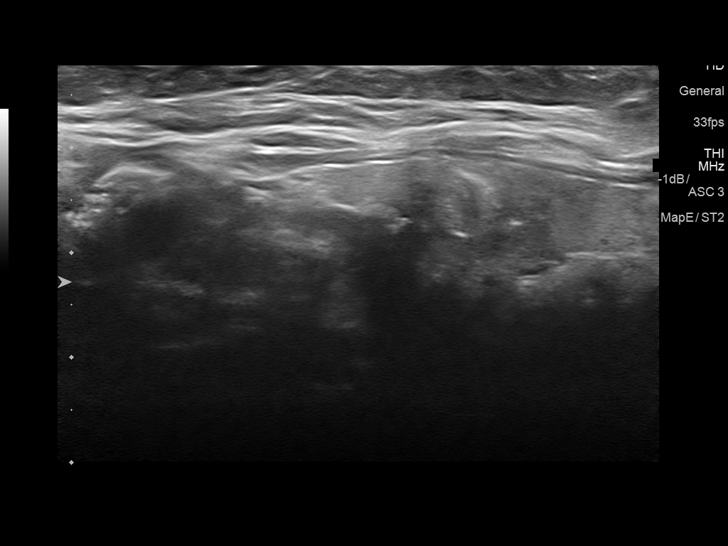
[im 29/57]
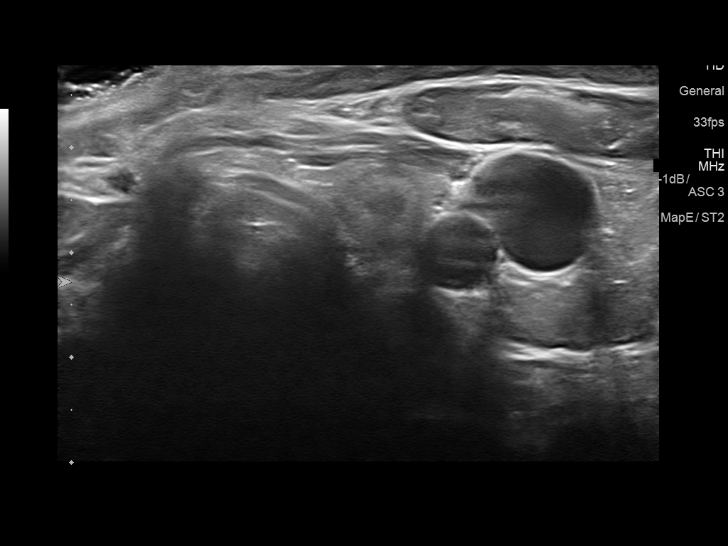
[im 33/57]
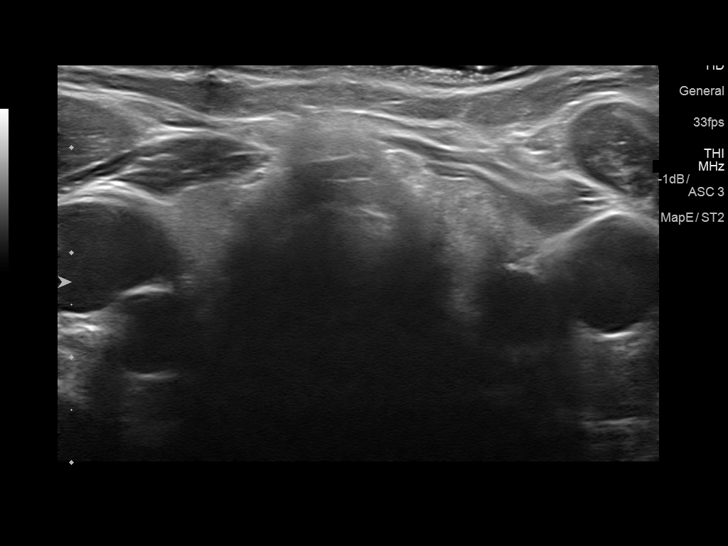
[im 38/57]
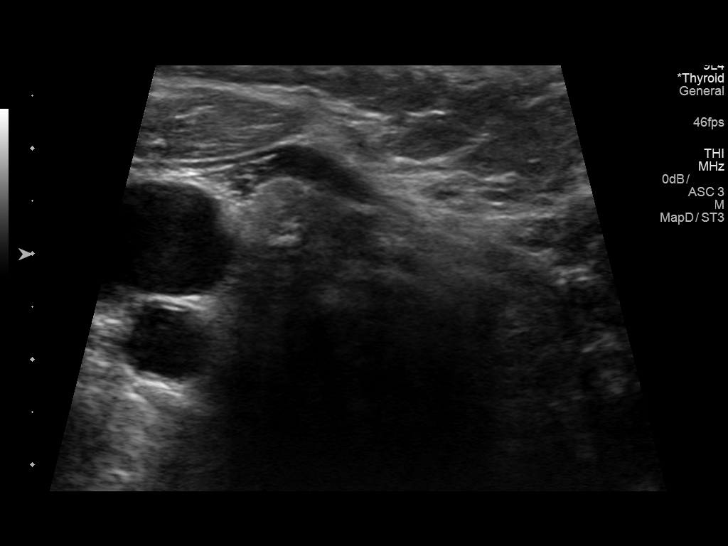
[im 43/57]
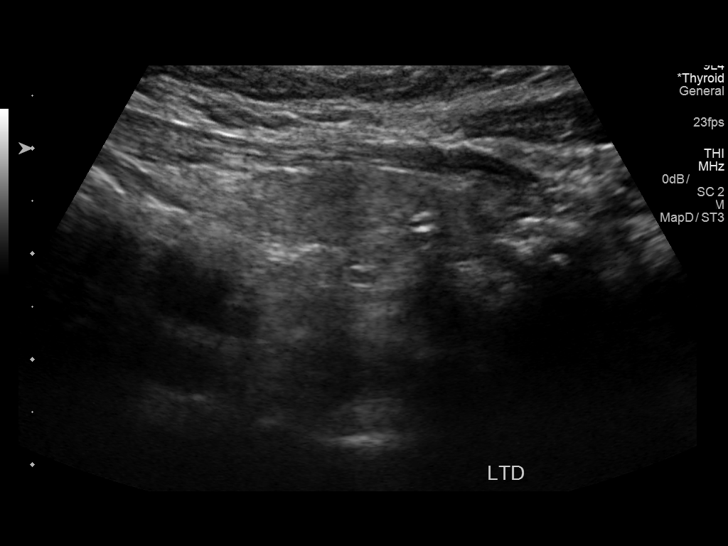
[im 47/57]
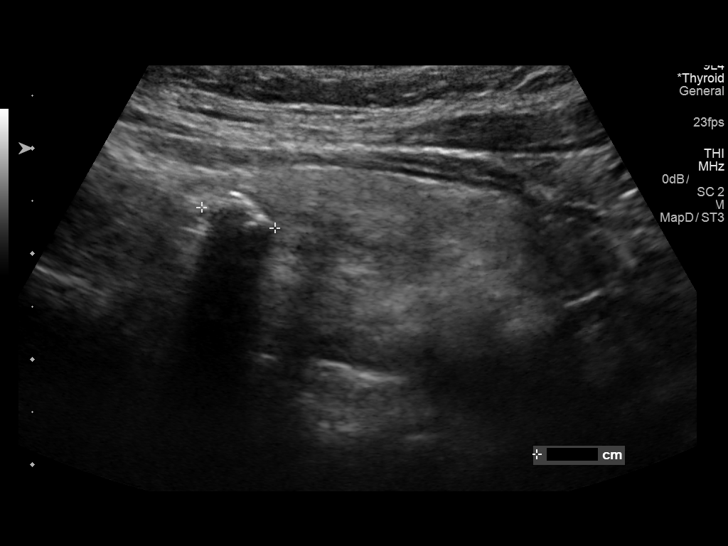
[im 52/57]
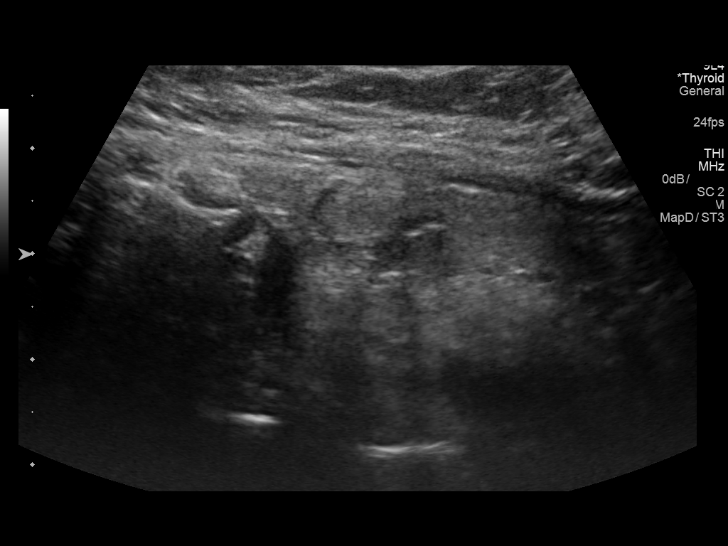
[im 57/57]
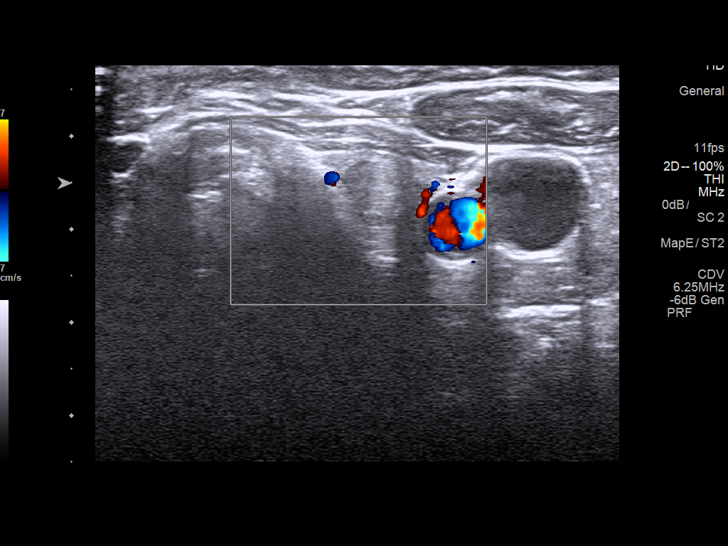

[13 of 25 positions shown; findings below may reference images not displayed]

FINDINGS: Parenchymal Echotexture: Moderately heterogenous

Isthmus: 0.2 cm thickness, previously

Right lobe: 3.2 x 0.8 x 0.8 cm, previously 3.4 x 1 x

Left lobe: 3.2 x 0.8 x 1.3 cm, previously 3.7 x 1 x 1

_________________________________________________________

Estimated total number of nodules >/= 1 cm: 0

Number of spongiform nodules >/=  2 cm not described below (TR1): 0

Number of mixed cystic and solid nodules >/= 1.5 cm not described
below (TR2): 0

_________________________________________________________

0.7 cm peripherally calcified nodule, right inferior; does not meet
criteria for biopsy or follow-up

0.8 cm inferior right nodule with coarse calcifications, previously
1.8 cm; lack of progression for greater than 5 years implies
benignity

0.7 cm peripherally calcified cyst superior left nodule, stable;
stability for greater than 5 years implies benignity

0.8 cm Hyperechoic mid left nodule, previously 1.6 cm; lack of
progression for greater than 5 years implies benignity
IMPRESSION: 1. Small thyroid with subcentimeter nodules . None meets criteria
for biopsy or dedicated imaging follow-up.

The above is in keeping with the ACR TI-RADS recommendations - [HOSPITAL] 8728;[DATE].

## 2018-07-10 ENCOUNTER — Encounter (INDEPENDENT_AMBULATORY_CARE_PROVIDER_SITE_OTHER): Payer: Self-pay | Admitting: "Endocrinology

## 2018-07-10 ENCOUNTER — Ambulatory Visit (INDEPENDENT_AMBULATORY_CARE_PROVIDER_SITE_OTHER): Payer: Medicare PPO | Admitting: "Endocrinology

## 2018-07-10 VITALS — BP 130/90 | HR 60 | Ht 60.0 in | Wt 156.0 lb

## 2018-07-10 DIAGNOSIS — R5383 Other fatigue: Secondary | ICD-10-CM

## 2018-07-10 DIAGNOSIS — R432 Parageusia: Secondary | ICD-10-CM

## 2018-07-10 DIAGNOSIS — E049 Nontoxic goiter, unspecified: Secondary | ICD-10-CM

## 2018-07-10 DIAGNOSIS — R252 Cramp and spasm: Secondary | ICD-10-CM

## 2018-07-10 DIAGNOSIS — E063 Autoimmune thyroiditis: Secondary | ICD-10-CM

## 2018-07-10 DIAGNOSIS — E211 Secondary hyperparathyroidism, not elsewhere classified: Secondary | ICD-10-CM | POA: Diagnosis not present

## 2018-07-10 DIAGNOSIS — R1013 Epigastric pain: Secondary | ICD-10-CM | POA: Diagnosis not present

## 2018-07-10 DIAGNOSIS — E559 Vitamin D deficiency, unspecified: Secondary | ICD-10-CM | POA: Diagnosis not present

## 2018-07-10 NOTE — Progress Notes (Signed)
Subjective:  Patient Name: Joy Daniels Date of Birth: 04-13-1941  MRN: [id number]  [name]  presents to the office today for follow-up of her secondary hyperparathyroidism, hypocalcemia, vitamin D deficiency, iron deficiency anemia, dysgeusia, dyspepsia, intermittent abnormal TFTs/hypothyroidism, thyroiditis, nontoxic multinodular goiter, s/p colon cancer, s/p mantle cell lymphoma, and mild dementia.  HISTORY OF PRESENT ILLNESS:   Joy Daniels is a 77 y.o. Caucasian woman.  [name] was accompanied by her daughter, Joy Daniels.   1. The patient was first referred to me on 02/01/05 by her primary care physician, Dr. Micheal Likens, for follow up of toxic nodular goiter. The patient was then 77 years of age.   A.  The patient had been previously evaluated by Dr. Genelle Bal, an adult endocrinologist based in [place]. Dr. London Pepper was performing an outreach clinic at the [facility] in [place], [place]. He evaluated the patient for hyperthyroidism and found that she had a toxic nodular goiter. He arranged for her to have radioactive iodine treatment on 12/13/04 with 24.5 mCi of I-131. When Dr. London Pepper retired, I was asked to handle the outreach clinic for the next several months. At the patient's first visit with me, many of her hyperthyroid symptoms and signs had significantly improved. Past medical history was positive for hypertension, osteoporosis, hip pains, dyspepsia, and GERD. She had a previous injury to her right hand that required surgical repair. Family history was positive for cancer in one sister and a heart attack in another sister. Her brother had diabetes and had a stroke. On physical examination she was hypertensive. She had a 25+ gram goiter. She also had pallor of the nailbeds of her fingers.   B. At her next follow up visit in [place] on 03/29/05, she felt much better. Her energy was much improved. Muscle strength was improving. She was still significantly bothered by her excessive belly hunger that was  causing weight gain. TFTs from 03/22/05 were normal, with a TSH of 1.11, a free T4 of 1.22, and a free T3 of 2.8. I started her on Nexium 40 mg per day. Since I was getting ready to terminate the outreach clinic in [place], I made arrangements for her to be followed at my clinic in [place].   2. During the last 13 years that I have followed her here in [place], we have dealt with several different problems:  A. I followed two aspects of her multinodular goiter, the nodules themselves and her thyroid hormone status.   1. A thyroid ultrasound performed on 10/26/06 showed a 1.5 cm dominant hyperechoic mass in the left upper pole. There were 2 smaller nodules in the right lobe measuring 6.9 and 7.6 mm in diameter. On 12/14/06 a fine needle aspirate was performed on the dominant nodule in the left upper lobe. The pathologists diagnosis was: "Rare benign follicular cells, abundant histiocytes and colloid. No neoplasms identified." A follow up thyroid ultrasound performed on 11/05/07 showed an echogenic nodule in the mid left lobe measuring 1.7 x 1.1 x 0.8 cm. This was not grossly changed from images obtained during the previous fine needle aspirate. There was also a smaller nodule in the left lobe measuring 1.3 x 0.6 cm.  In the inferior aspect of the right lobe there was a nodule measuring 1.1 x 0.7 x 0.5. Her next thyroid ultrasound performed on 01/14/2009 showed a partially calcified nodule in the lower pole of the right lobe measuring 1.8 x 0.6 x 0.8. By report, this nodule had increased somewhat in size. However, because the images from  the 2009 study were not available, no direct comparison could be made. The largest nodule in the left lobe measured 1.6 x 1.0 x 0.8., essentially unchanged from the previous study. Since her clinical exam has been stable since 2010 I did not repeat her thyroid US until 01/25/18. As shown below, her two nodules that were >1 cm in diameter have shrunk down to <1 cm in diameter.  None of her nodules look suspicious or meet criteria for biopsy.   2. The patient remained euthyroid through the spring of 2010. In September of 2010 and again in April of 2011 she became mildly hypothyroid. It appeared that she was having some flare ups of Hashimoto's disease at those times. Her TFTs subsequently normalized on 11/26/10.  B. Because of her known osteoporosis, I wanted to treat her with a bisphosphonate. Since she had previously not tolerated Actonel well, she was reluctant. I finally convinced her to try Boniva, but she later discontinued that medication as well. She was not at all interested in doing Forteo injections. To further try to minimize bone loss, I monitored her bone mineral parameters closely. Up through her visit on 04/17/07 her parameters were normal, but she had many difficulties in taking all of her calcium and vitamin D pills, in part due to constipation. Unfortunately, by 12/03/07 her 25-hydroxy vitamin D dropped to 24.2 and her 1,25- dihydroxy vitamin D dropped to 23.3. It appeared not only was she deficient in vitamin D intake, but her kidneys were also not optimally converting 25-hydroxy vitamin D to 1, 25-dihydroxy hydroxy vitamin D. I prescribed vitamin D, 50,000 IU per week. I also added calcitriol, 0.25 mcg per week. When she took these medications, her bone parameters normalized. In early 2010, however, she had stopped taking these medications. Her PTH rose to 75 and then to 81.8. When she again took her calcium and her two vitamin D preparations, her PTH decreased progressively to 62.1 and then to 33 on 11/26/10. On that date, her serum calcium was 10.2, her 25-hydroxy vitamin D was 32.2, and her 1, 25- dihydroxy vitamin D was 28.2. By then, however, she had stopped taking the vitamin D 50,000 international units weekly because her insurance company would not cover it. Her calcium levels, vitamin D levels, and PTH levels have fluctuated ever since.   C. In 2011 she developed  stage IV mantle cell lymphoma of the tonsils, stomach, and digestive tract and was successfully treated with chemotherapy for two years. She later developed problems with her balance that appeared to be due to the chemotherapy.  D. In late 2014 she developed cancer of the ascending colon and was successfully treated with surgery.    3. The patient's last [facility] visit was on 01/08/18. At that visit we continued all of her medications.   A. In the interim, she has been fairly healthy. She walks with a shuffling gait and foot drop, so she has fallen or stumbled in the past. Both connie and I have encouraged her to walk heel to toe, but she just does not do so.  B. She has developed some memory problems over time. Her distant memory is intact. Her recent memory is not so good. She frequently can't remember much that happens day to day. Her personality, social skills, and intelligence are so good, however, that she appears to be quite normal.   C. Joy Daniels is trying to help her mother avoid sweets, but not always successfully. .   D. She continues to have  urinary incontinence, but not as bad. She is no longer taking any medications for this problem.  E. She shows no signs of recurrence of her mantle cell lymphoma or her colon cancer. She is followed by oncology every 6 months.   F. Her energy is not too good. She stays cold all the time. She is not walking much.    G. She has not had many attacks of shortness of breath, which she attributes to allergies or COPD. She still becomes somewhat short of breath if she is too active. She has not needed to use either her inhaler or her nebulizer.    H. 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 She uses a walker when she walks outside the house, for example, to go shopping.    I. She is taking her calcitriol, 0.25 mcg, twice weekly; Citracal with D twice daily; omeprazole 40, once daily; metoprolol  ER, 50 mg, once daily; B12, 100 mcg, once daily; KCl ER 10 mEq, twice daily; losartan 25 mg/day, HCTZ 12.5 mg/day;  ezetimibe, 10 mg/day, Montelukast, 10 mg, once daily. Dr Rica Records asked her to stop taking Vitamin D, 50,000 IU weekly because her vitamin D level was high immediately after taking her weekly dose. I asked her to resume the weekly dose.      J. Food still does not taste good. Only sweet things taste good. Joy Daniels thinks that her appetite has improved since being on B6.   K. Betheny says that her hair is still coming out at times. Connie dose not concur.  4. Pertinent Review of Systems:  Constitutional: The patient feels "lightheaded when I walk sometimes and very tired". She has a cane and a walker, but won't use them.  Eyes: Vision is better since having bilateral cataractectomies, but the vision is not as good in her right eye. She had an eye appointment in the summer of 2019, but no changes were made in her glasses. She does not wear the glasses often. The ophthalmologist recommended eye drops.  Mouth: Mouth is not dry.    Neck: She has not had any thyroid pains or inflammation. She occasionally still has left postero-lateral neck pains, as she has had off and on for years. Her range of motion of the cervical spine is much reduced. She no longer has difficulty swallowing unless her mouth gets too dry.    Heart: She is not aware of any palpitations, chest pain, or chest pressure.  Lungs: Her COPD is probably unchanged. She still gets short of breath with minor exertion.  Gastrointestinal: She is not constipated when she takes her Miralax and Benefiber. The patient has no complaints of excessive hunger, acid reflux, upset stomach, stomach aches or pains. Legs: She has not had any calf cramps at night since her last visit.  Muscle mass and strength seem normal. There are no complaints of numbness, tingling, or burning. She does not have much edema as long as she takes her HCTZ.  Feet: There are  no obvious foot problems. She has occasional edema.  Neuro: Her balance is not good.    PAST MEDICAL, FAMILY, AND SOCIAL HISTORY:  Past Medical History:  Diagnosis Date  . Combined hyperlipidemia   . COPD (chronic obstructive pulmonary disease) ([place])   . Dysgeusia   . GERD (gastroesophageal reflux disease)   . Hypercalcemia   . Hyperparathyroidism , secondary, non-renal ([place])   . Hypertension   . Hypothyroidism, acquired,  autoimmune   . Iron deficiency   . Lymphoma, mantle cell, multiple sites ([place])   . Nontoxic multinodular goiter   . Obesity (BMI 30.0-34.9)   . Other osteoporosis   . Pallor   . Thyroiditis, autoimmune   . Toxic nodular goiter   . Vitamin D deficiency disease     Family History  Problem Relation Age of Onset  . Diabetes Father   . Cancer Sister   . Heart disease Sister      Current Outpatient Medications:  .  calcitRIOL (ROCALTROL) 0.25 MCG capsule, TAKE 1 CAPSULE TWICE WEEKLY, Disp: 25 capsule, Rfl: 3 .  ezetimibe (ZETIA) 10 MG tablet, Take 10 mg by mouth daily., Disp: , Rfl:  .  hydrochlorothiazide (MICROZIDE) 12.5 MG capsule, Take 12.5 mg by mouth daily., Disp: , Rfl:  .  losartan (COZAAR) 25 MG tablet, Take 25 mg by mouth daily., Disp: , Rfl:  .  metoprolol tartrate (LOPRESSOR) 50 MG tablet, Take 50 mg by mouth daily., Disp: , Rfl: 0 .  montelukast (SINGULAIR) 10 MG tablet, Take 10 mg by mouth at bedtime., Disp: , Rfl:  .  omeprazole (PRILOSEC) 10 MG capsule, Take 10 mg by mouth daily.  , Disp: , Rfl:  .  potassium chloride (K-DUR,KLOR-CON) 10 MEQ tablet, , Disp: , Rfl:  .  Vitamin D, Ergocalciferol, (DRISDOL) 50000 units CAPS capsule, , Disp: , Rfl:  .  colesevelam (WELCHOL) 625 MG tablet, Take by mouth 2 (two) times daily with a meal. 3 tablest bid, Disp: , Rfl:  .  fesoterodine (TOVIAZ) 4 MG TB24 tablet, Take 4 mg by mouth daily., Disp: , Rfl:   Allergies as of 07/10/2018 - Review Complete 01/08/2018  Allergen Reaction Noted  . Codeine   01/27/2011  . Latex  07/10/2018    1. Work and Family: Her family is supportive. Perline lives with her husband, daughter Joy Daniels, and three adult grandsons.    2. Activities: She is trying to walk in the house. 3. Smoking, alcohol, or drugs: None 4. Primary Care Provider: Dr. Gae Bon B. Uppin  REVIEW OF SYSTEMS: There are no other significant problems involving Rosmarie's other body systems.   Objective:  Vital Signs:  BP 130/90   Pulse 60   Ht 5' (1.524 m)   Wt 156 lb (70.8 kg)   BMI 30.47 kg/m  She forgot to take her medications today.    Ht Readings from Last 3 Encounters:  07/10/18 5' (1.524 m)  01/08/18 5' (1.524 m)  10/12/10 5' (1.524 m)   Wt Readings from Last 3 Encounters:  07/10/18 156 lb (70.8 kg)  01/08/18 159 lb (72.1 kg)  07/10/17 163 lb 4.8 oz (74.1 kg)   PHYSICAL EXAM:  Constitutional: The patient looks good today. She has lost 3 pounds since her last visit. She is alert, oriented, and quite lucid, but did have difficulty remembering some things and had to turn to [place] for help.  Her affect, sense of humor, and insight are good. It is always a pleasure to see her. Face: Her face appears very lined and worn. She looks about 18 years older than her chronologic age.  Eyes: There is no obvious arcus or proptosis. Moisture appears normal. She has a slight droop of both eyelids.   Mouth: The oropharynx is normal. The tongue appears normal. Oral moisture is somewhat low.  Neck: The neck appears to be visibly normal. No carotid bruits are noted. The thyroid gland is low-lying. The thyroid gland is normal  at about 18 grams in size.  Both lobes are normal in size today. The consistency of the thyroid gland is normal. There is no thyroid tenderness.  Lungs: The lungs are clear to auscultation. Air movement is good. Heart: Heart rate and rhythm are regular. Heart sounds S1 and S2 are normal. I did not appreciate any pathologic cardiac murmurs. Abdomen: The abdomen is enlarged.  Bowel sounds are normal. There is no obvious hepatomegaly, splenomegaly, or other mass effect.  Arms: Muscle size and bulk are normal for age. Hands: There is no obvious tremor. Phalangeal and metacarpophalangeal joints are normal. Palmar muscles are normal. Palmar skin is normal. Palmar moisture is also normal. Her nail pallor has recurred.  Legs: Muscles appear normal for age. She has no edema in the legs today. Neurologic: Strength is fairly normal for age in both the upper and lower extremities. Muscle tone is normal. Sensation to touch is normal in both legs.      LAB DATA:   Labs 02/23/18: PTH 65, calcium 10.9, 1,25 vitamin D 45 (ref 18-72)   Labs 01/08/18: TSH 2.08, free T4 1.1, free T3 2.2; PTH 68, calcium 10.9, 25-OH vitamin D 37  Labs 07/10/17: TSH 1.96, free T4 1.0, free T3 2.6; CMP normal, except calcium 10.5; 25-OH vitamin D 46; PTH 74 (14-64), vitamin B12 403 (ref 200-1,100), vitamin B6 18.9 (ref 2.1-21.7)  Labs 01/05/17: PTH 71 (ref 14-64), calcium 10.3, 25-OH vitamin D 58, 1,25-dihydroxy vitamin D 56 (ref 18-72)  Labs 10/04/16: Surgical pathology: Peripheral blood flow cytometry: Relative larger amount of T cells than B cells and reversal of the CD4:CD8 ratio  Labs 06/24/16: TSH 2.36, free T4 0,5, free T3 2.5; CMP normal, except for bilirubin 1.6 and ALT 40; 25-OH vitamin D 42  Labs 12/22/15: TSH 2.66, free T4 1.2, free T3 2.4; CMP normal except for AST 39 and ALT 48; PTH 79 (ref 14-64), calcium 10.0,  25-OH vitamin D 47, 1,25-dihydroxy vitamin d 40 (ref 18.72)  Labs: 11/18/14: 25-OH vitamin D 32.9, calcium 10.1; ALT 42; cholesterol 237, triglycerides 290, HDL 46, LDL 133  Labs 08/14/14: 25-OH vitamin D 31.9, calcium 10.6; ALT 33; TSH 3.47   Labs 11/05/13: TSH 2.902, free T4 1.06, free T3 2.5; 1,25-dihydroxy vitamin D 39; 25-hydroxy vitamin D 47, PTH 72, calcium 10.1  Labs 06/11/13: 1,25-dihydroxy vitamin D 39, 25-hydroxy vitamin D 47, PTH 72, calcium 10.1 ; TSH 2.901, free T4  1.06, free T3  2.5  Labs 12/10/12: Hgb 11.1, Hct 33.6%; TSH 2.964, free T4 1.06, free T3 2.4; 25-hydroxy vitamin D 37, PTH 89.4, calcium 9.9; CMP normal  Labs 06/11/12: CMP was normal, to include a calcium of 9.9; TSH 2.195,  free T4 0.98, free T3 2.6; PTH 102.3, 25-hydroxy vitamin D 29, 1,25-dihydroxy vitamin D 57.  Labs 11/17/11: PTH 70.9,calcium 10.0, 25-hydroxy vitamin D 40, 1,25-dihydroxy vitamin D 67, TSH 2.312. Free T4  0.97, Free T3 2.4  Labs 05/19/11: PTH 64, calcium 10.4, 25-hydroxy vitamin D 36, 1,25-dihydroxy vitamin D 47, TSH 2.497, free T4 1.04, free T3 2.6  IMAGING:  Thyroid US 01/25/18: 0.7 cm peripheral calcified nodule, right inferior ; 0.8 cm inferior right nodule with coarse calcifications, previously 1.8 cm, no progression for greater than 5 years; 0.7 cm peripherally calcified cyst in superior left lobe, stable for greater than 5 years; 0.8 cm hyperechoic nodules in mid left lobe, previously 1.6 cm, stable for greater than 5 years. No nodule met criteria for biopsy.    Assessment and Plan:  ASSESSMENT:  1. Obesity: The patient had lost more weight in the past 6 months. Food still does not taste very good, but she loves her sweets.  She needs to walk more.  2. Vitamin D deficiency: Patient's 25-hydroxy vitamin D levels in April 2017, October 2017, April 2018, October 2018 April 2019 were normal. Her 1,25-dihydroxy vitamin D level in April 2017, April 2018, and June 2019 were also normal. The combination of oral vitamin D and oral calcitriol is working successfully for her.  3. Thyroiditis/goiter: Her Hashimoto's disease was quiescent at her last 2 visits and is quiescent today. Her thyroid gland is again within normal limits for size today. The episodic "tenderness" that she has had previously and the process of waxing and waning of thyroid gland size are c/w thyroiditis.  She was euthyroid again in April and October 2017,  in October 2018, and in April 2019. Thyroid US showed  several unremarkable small nodules.  4. Dyspepsia: She is doing better on omeprazole. 5. Hyperparathyroidism, secondary to calcium and vitamin D intake deficiency: Her PTH has been mildly elevated for several years, but her calciums have been in the upper quartile of the normal range or slightly elevated. Her calcium in October was at the upper end of the normal range. Her calcum in April and June 2019 was mildly elevated. It is possible that she could be developing tertiary hyperparathyroidism. If so, it will be important to keep her calcium levels relatively high and her vitamin D levels normal in order to control the PTH. If we and she are lucky, the calcium levels will not increase too much and she will not need parathyroidectomy. We will follow this problem over time.  6. Dysgeusia: Her dysgeusia improved after resuming her Centrum Silver. She might benefit from also taking a B complex vitamin pill daily.  7. Fatigue: Patient's fatigue is still present, which I suspect is due in part to her chemotherapy. She is also very deconditioned. She needs to walk more.   8. Lymphoma: Her lymphoma is in remission, which is wonderful.  9. Colon cancer: Her colon cancer is also in remission, which is also wonderful.  11. Calf cramps and restless legs:  These problems have resolved. I again  recommended that she stretch the calves before going to bed.  12. Memory problems: She appears to be doing pretty well overall, but needs ongoing support from [place].   13. Combined hyperlipidemia: Dr. Rica Records is following this problem for her.  14. Pallor: She will need a CBC and CMP when she sees her oncologist. 15. Hair loss: Her scalp is somewhat thin, but I'm not sure that there is any difference from her last several visits. I have talked with her about Rogaine for Women in the past, but she declined.   PLAN:  1. Diagnostic: Repeat calcium, PTH, 25-OH vitamin D. Consider BMD.  2. Therapeutic: Continue calcitriol at  one tablet/capsule, twice weekly. Please take all of her medications, to include her calcitriol, calcium, and vitamin D. Continue to take a multivitamin pill and one vitamin B6 capsule daily 3. 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 We need to keep up her intake of calcium and vitamin D. Unfortunately, if her PTH function worsens she might need parathyroid surgery. 4. Follow-up: 6 months  Level of Service: This visit lasted in excess of 55 minutes. More than 50% of the visit was devoted to  counseling.  Tillman Sers, MD, CDE Adult and Pediatric Endocrinology 07/10/2018 3:43 PM

## 2018-07-10 NOTE — Patient Instructions (Signed)
Follow up visit in 6 months. 

## 2018-07-11 LAB — PTH, INTACT AND CALCIUM
Calcium: 10.8 mg/dL — ABNORMAL HIGH (ref 8.6–10.4)
PTH: 120 pg/mL — ABNORMAL HIGH (ref 14–64)

## 2018-07-11 LAB — VITAMIN D 25 HYDROXY (VIT D DEFICIENCY, FRACTURES): Vit D, 25-Hydroxy: 51 ng/mL (ref 30–100)

## 2018-07-12 ENCOUNTER — Other Ambulatory Visit (INDEPENDENT_AMBULATORY_CARE_PROVIDER_SITE_OTHER): Payer: Self-pay | Admitting: *Deleted

## 2018-07-12 ENCOUNTER — Encounter (INDEPENDENT_AMBULATORY_CARE_PROVIDER_SITE_OTHER): Payer: Self-pay | Admitting: *Deleted

## 2018-07-12 DIAGNOSIS — E0520 Thyrotoxicosis with toxic multinodular goiter without thyrotoxic crisis or storm: Secondary | ICD-10-CM

## 2018-07-30 DIAGNOSIS — E785 Hyperlipidemia, unspecified: Secondary | ICD-10-CM | POA: Diagnosis not present

## 2018-07-30 DIAGNOSIS — E669 Obesity, unspecified: Secondary | ICD-10-CM | POA: Diagnosis not present

## 2018-07-30 DIAGNOSIS — I10 Essential (primary) hypertension: Secondary | ICD-10-CM | POA: Diagnosis not present

## 2018-07-30 DIAGNOSIS — Z23 Encounter for immunization: Secondary | ICD-10-CM | POA: Diagnosis not present

## 2018-07-30 DIAGNOSIS — Z6830 Body mass index (BMI) 30.0-30.9, adult: Secondary | ICD-10-CM | POA: Diagnosis not present

## 2018-07-30 DIAGNOSIS — E559 Vitamin D deficiency, unspecified: Secondary | ICD-10-CM | POA: Diagnosis not present

## 2018-08-01 DIAGNOSIS — M7989 Other specified soft tissue disorders: Secondary | ICD-10-CM | POA: Diagnosis not present

## 2018-08-01 DIAGNOSIS — M109 Gout, unspecified: Secondary | ICD-10-CM | POA: Diagnosis not present

## 2018-08-01 DIAGNOSIS — M79671 Pain in right foot: Secondary | ICD-10-CM | POA: Diagnosis not present

## 2018-08-01 DIAGNOSIS — M7751 Other enthesopathy of right foot: Secondary | ICD-10-CM | POA: Diagnosis not present

## 2018-08-06 DIAGNOSIS — Z09 Encounter for follow-up examination after completed treatment for conditions other than malignant neoplasm: Secondary | ICD-10-CM | POA: Diagnosis not present

## 2018-08-06 DIAGNOSIS — M109 Gout, unspecified: Secondary | ICD-10-CM | POA: Diagnosis not present

## 2018-08-06 DIAGNOSIS — Z6830 Body mass index (BMI) 30.0-30.9, adult: Secondary | ICD-10-CM | POA: Diagnosis not present

## 2018-08-06 DIAGNOSIS — Z79899 Other long term (current) drug therapy: Secondary | ICD-10-CM | POA: Diagnosis not present

## 2018-08-15 DIAGNOSIS — Z6829 Body mass index (BMI) 29.0-29.9, adult: Secondary | ICD-10-CM | POA: Diagnosis not present

## 2018-08-15 DIAGNOSIS — J309 Allergic rhinitis, unspecified: Secondary | ICD-10-CM | POA: Diagnosis not present

## 2018-08-15 DIAGNOSIS — B372 Candidiasis of skin and nail: Secondary | ICD-10-CM | POA: Diagnosis not present

## 2018-08-15 DIAGNOSIS — J209 Acute bronchitis, unspecified: Secondary | ICD-10-CM | POA: Diagnosis not present

## 2018-08-15 DIAGNOSIS — E559 Vitamin D deficiency, unspecified: Secondary | ICD-10-CM | POA: Diagnosis not present

## 2018-08-15 DIAGNOSIS — J019 Acute sinusitis, unspecified: Secondary | ICD-10-CM | POA: Diagnosis not present

## 2018-08-20 DIAGNOSIS — M109 Gout, unspecified: Secondary | ICD-10-CM | POA: Diagnosis not present

## 2018-08-20 DIAGNOSIS — I10 Essential (primary) hypertension: Secondary | ICD-10-CM | POA: Diagnosis not present

## 2018-08-20 DIAGNOSIS — E782 Mixed hyperlipidemia: Secondary | ICD-10-CM | POA: Diagnosis not present

## 2018-08-20 DIAGNOSIS — Z6829 Body mass index (BMI) 29.0-29.9, adult: Secondary | ICD-10-CM | POA: Diagnosis not present

## 2018-09-03 ENCOUNTER — Other Ambulatory Visit (INDEPENDENT_AMBULATORY_CARE_PROVIDER_SITE_OTHER): Payer: Self-pay | Admitting: *Deleted

## 2018-09-03 DIAGNOSIS — E211 Secondary hyperparathyroidism, not elsewhere classified: Secondary | ICD-10-CM

## 2018-09-21 DIAGNOSIS — E782 Mixed hyperlipidemia: Secondary | ICD-10-CM | POA: Diagnosis not present

## 2018-09-21 DIAGNOSIS — I10 Essential (primary) hypertension: Secondary | ICD-10-CM | POA: Diagnosis not present

## 2018-11-22 DIAGNOSIS — R4182 Altered mental status, unspecified: Secondary | ICD-10-CM | POA: Diagnosis not present

## 2018-11-22 DIAGNOSIS — S0990XA Unspecified injury of head, initial encounter: Secondary | ICD-10-CM | POA: Diagnosis not present

## 2018-11-22 DIAGNOSIS — N3000 Acute cystitis without hematuria: Secondary | ICD-10-CM | POA: Diagnosis not present

## 2018-11-22 DIAGNOSIS — R42 Dizziness and giddiness: Secondary | ICD-10-CM | POA: Diagnosis not present

## 2018-12-17 ENCOUNTER — Ambulatory Visit (INDEPENDENT_AMBULATORY_CARE_PROVIDER_SITE_OTHER): Payer: Medicare PPO | Admitting: "Endocrinology

## 2018-12-28 DIAGNOSIS — E790 Hyperuricemia without signs of inflammatory arthritis and tophaceous disease: Secondary | ICD-10-CM | POA: Diagnosis not present

## 2018-12-28 DIAGNOSIS — I10 Essential (primary) hypertension: Secondary | ICD-10-CM | POA: Diagnosis not present

## 2018-12-28 DIAGNOSIS — E782 Mixed hyperlipidemia: Secondary | ICD-10-CM | POA: Diagnosis not present

## 2019-01-08 ENCOUNTER — Ambulatory Visit (INDEPENDENT_AMBULATORY_CARE_PROVIDER_SITE_OTHER): Payer: Medicare PPO | Admitting: "Endocrinology

## 2019-01-08 ENCOUNTER — Encounter (INDEPENDENT_AMBULATORY_CARE_PROVIDER_SITE_OTHER): Payer: Self-pay | Admitting: "Endocrinology

## 2019-01-08 ENCOUNTER — Other Ambulatory Visit: Payer: Self-pay

## 2019-01-08 DIAGNOSIS — E063 Autoimmune thyroiditis: Secondary | ICD-10-CM | POA: Diagnosis not present

## 2019-01-08 DIAGNOSIS — E559 Vitamin D deficiency, unspecified: Secondary | ICD-10-CM | POA: Diagnosis not present

## 2019-01-08 DIAGNOSIS — T451X5A Adverse effect of antineoplastic and immunosuppressive drugs, initial encounter: Secondary | ICD-10-CM

## 2019-01-08 DIAGNOSIS — N2581 Secondary hyperparathyroidism of renal origin: Secondary | ICD-10-CM

## 2019-01-08 DIAGNOSIS — E049 Nontoxic goiter, unspecified: Secondary | ICD-10-CM

## 2019-01-08 DIAGNOSIS — R432 Parageusia: Secondary | ICD-10-CM | POA: Diagnosis not present

## 2019-01-08 DIAGNOSIS — G620 Drug-induced polyneuropathy: Secondary | ICD-10-CM | POA: Diagnosis not present

## 2019-01-08 DIAGNOSIS — R5383 Other fatigue: Secondary | ICD-10-CM

## 2019-01-08 DIAGNOSIS — R1013 Epigastric pain: Secondary | ICD-10-CM | POA: Diagnosis not present

## 2019-01-08 DIAGNOSIS — R413 Other amnesia: Secondary | ICD-10-CM | POA: Diagnosis not present

## 2019-01-08 NOTE — Patient Instructions (Signed)
Follow up visit in 3 months. 

## 2019-01-08 NOTE — Progress Notes (Signed)
Subjective:  Patient Name: Joy Daniels Date of Birth: [birth year]  MRN: [id number]  Joy Daniels  presents at today's WebEx visit for follow-up of her secondary hyperparathyroidism, hypocalcemia, vitamin D deficiency, iron deficiency anemia, dysgeusia, dyspepsia, intermittent abnormal TFTs/hypothyroidism, thyroiditis, nontoxic multinodular goiter, s/p colon cancer, s/p mantle cell lymphoma, and mild dementia.  HISTORY OF PRESENT ILLNESS:   Joy Daniels is a 78 y.o. Caucasian woman.  Kemiah was accompanied by her daughter, Joy Daniels.   1. Ms. Coover was first referred to me on 02/01/05 by her primary care physician, Dr. Micheal Likens, for follow up of toxic nodular goiter. The patient was then 78 years of age.   A.  The patient had been previously evaluated by Dr. Genelle Bal, an adult endocrinologist based in [place]. Dr. London Pepper was performing an outreach clinic at the [facility] in [place], [place]. He evaluated the patient for hyperthyroidism and found that she had a toxic nodular goiter. He arranged for her to have radioactive iodine treatment on 12/13/04 with 24.5 mCi of I-131. When Dr. London Pepper retired, I was asked to handle the outreach clinic for the next several months. At the patient's first visit with me, many of her hyperthyroid symptoms and signs had significantly improved. Past medical history was positive for hypertension, osteoporosis, hip pains, dyspepsia, and GERD. She had a previous injury to her right hand that required surgical repair. Family history was positive for cancer in one sister and a heart attack in another sister. Her brother had diabetes and had a stroke. On physical examination she was hypertensive. She had a 25+ gram goiter. She also had pallor of the nailbeds of her fingers.   B. At her next follow up visit in [place] on 03/29/05, she felt much better. Her energy was much improved. Muscle strength was improving. She was still significantly bothered by her excessive belly hunger that was  causing weight gain. TFTs from 03/22/05 were normal, with a TSH of 1.11, a free T4 of 1.22, and a free T3 of 2.8. I started her on Nexium 40 mg per day. Since I was getting ready to terminate the outreach clinic in [place], I made arrangements for her to be followed at my clinic in [place].   2. During the last 14 years that I have followed her here in [place], we have dealt with several different problems:  A. I followed two aspects of her multinodular goiter, the nodules themselves and her thyroid hormone status.   1. A thyroid ultrasound performed on 10/26/06 showed a 1.5 cm dominant hyperechoic mass in the left upper pole. There were 2 smaller nodules in the right lobe measuring 6.9 and 7.6 mm in diameter. On 12/14/06 a fine needle aspirate was performed on the dominant nodule in the left upper lobe. The pathologists diagnosis was: "Rare benign follicular cells, abundant histiocytes and colloid. No neoplasms identified." A follow up thyroid ultrasound performed on 11/05/07 showed an echogenic nodule in the mid left lobe measuring 1.7 x 1.1 x 0.8 cm. This was not grossly changed from images obtained during the previous fine needle aspirate. There was also a smaller nodule in the left lobe measuring 1.3 x 0.6 cm.  In the inferior aspect of the right lobe there was a nodule measuring 1.1 x 0.7 x 0.5. Her next thyroid ultrasound performed on 01/14/2009 showed a partially calcified nodule in the lower pole of the right lobe measuring 1.8 x 0.6 x 0.8. By report, this nodule had increased somewhat in size. However, because the images from  the 2009 study were not available, no direct comparison could be made. The largest nodule in the left lobe measured 1.6 x 1.0 x 0.8., essentially unchanged from the previous study. Since her clinical exam has been stable since 2010 I did not repeat her thyroid US until 01/25/18. As shown below, her two nodules that were >1 cm in diameter had shrunk down to <1 cm in diameter. None  of her nodules look suspicious or met criteria for biopsy.    2. The patient remained euthyroid through the spring of 2010. In September of 2010 and again in April of 2011 she became mildly hypothyroid. It appeared that she was having some flare ups of Hashimoto's disease at those times. Her TFTs subsequently normalized on 11/26/10.  B. Because of her known osteoporosis, I wanted to treat her with a bisphosphonate. Since she had previously not tolerated Actonel well, she was reluctant. I finally convinced her to try Boniva, but she later discontinued that medication as well. She was not at all interested in doing Forteo injections. To further try to minimize bone loss, I monitored her bone mineral parameters closely. Up through her visit on 04/17/07 her parameters were normal, but she had many difficulties in taking all of her calcium and vitamin D pills, in part due to constipation. Unfortunately, by 12/03/07 her 25-hydroxy vitamin D dropped to 24.2 and her 1,25- dihydroxy vitamin D dropped to 23.3. It appeared not only was she deficient in vitamin D intake, but her kidneys were also not optimally converting 25-hydroxy vitamin D to 1, 25-dihydroxy hydroxy vitamin D. I prescribed vitamin D, 50,000 IU per week. I also added calcitriol, 0.25 mcg per week. When she took these medications, her bone parameters normalized. In early 2010, however, she had stopped taking these medications. Her PTH rose to 75 and then to 81.8. When she again took her calcium and her two vitamin D preparations, her PTH decreased progressively to 62.1 and then to 33 on 11/26/10. On that date, her serum calcium was 10.2, her 25-hydroxy vitamin D was 32.2, and her 1, 25- dihydroxy vitamin D was 28.2. By then, however, she had stopped taking the vitamin D 50,000 international units weekly because her insurance company would not cover it. Her calcium levels, vitamin D levels, and PTH levels have fluctuated ever since, usually varying with her intakes  of calcium, vitamin D, and calcitriol. .   C. 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  D. In late 2014 she developed cancer of the ascending colon and was successfully treated with surgery.    3. The patient's last [facility] visit was on 07/10/18. At that visit we continued all of her medications, to include calcitriol twice weekly and vitamin B6 daily.   A. In the interim, she has been fairly healthy.   B. She has fallen two times when she had asymptomatic UTIs and got mentally confused. She still walks with a shuffling gait and foot drop, so she has often fallen or stumbled in the past. Both Connie and I have encouraged her to walk heel to toe, but she just does not do so.  C. She has developed some memory problems over time. She often forgets doctors' appointments and taking medications. Her distant memory is intact. Her recent memory is not  so good. She frequently can't remember much that happens day to day. Her personality, social skills, and intelligence are so good, however, that she appears to be quite normal.   D. Joy Daniels is trying to help her mother do what she needs to do, but not always successfully. Mom sometimes gets angry with Joy Daniels.   E. Ms Quirion continues to have urinary incontinence, but not as bad. She is not taking any medications for this problem.  F. She shows no signs of recurrence of her mantle cell lymphoma or her colon cancer. She is followed by oncology every 6 months.   G. Her energy is not too good. She joked, "I could sleep all day." She takes a 2-hour nap during the day. She stays cold all the time. She is not walking much.    H. She has not had many attacks of shortness of breath recently. She says that her COPD is gone. She still becomes  somewhat short of breath if she is too active. She has not needed to use either her inhaler or her nebulizer.    I. 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 She uses a walker when she walks outside the house, for example, to go shopping.    J. She has a new PCP who has discontinued her calcitriol, 0.25 mcg, twice weekly; Citracal with D twice daily, metoprolol, HCTZ, Montelukast, and vitamin D, 50,000 IU weekly; and B12,  She still takes omeprazole 40, once daily; KCl ER 10 mEq, twice daily; losartan 25 mg/day; ezetimibe, 10 mg/day; and vitamin B6 daily.     K. Food tastes a lot better. She eats two meals a day: breakfast/brunch well and dinner. Joy Daniels thinks that her appetite has improved since being on B6.   L. Harnoor says that her hair is better.   4. Pertinent Review of Systems:  Constitutional: Ms. Nolton eels "lightheaded when I walk sometimes and still very tired". She has a cane and a walker that she uses when she goes out, but not often at home.   Eyes: Vision is better since having bilateral cataractectomies, but the vision is not as good in her right eye. She had an eye appointment in the summer of 2019, but no changes were made in her glasses. She does not wear the glasses often. The ophthalmologist recommended eye drops, but she doesn't use them.  Mouth: Mouth is dry.    Neck: She has not had any thyroid pains or inflammation. She occasionally still has left postero-lateral neck pains, as she has had off and on for years. Her range of motion of the cervical spine is much reduced. She no longer has difficulty swallowing unless her mouth gets too dry.    Heart: She is not aware of any palpitations, chest pain, or chest pressure.  Lungs: Her COPD is probably unchanged. She still gets short of breath with minor exertion.  Gastrointestinal: She is constipated sometimes. She no longer takes Miralax and Benefiber  proactively, but only takes these fiber supplements now when she feels constipated. She has no complaints of excessive hunger, acid reflux, upset stomach, stomach aches or pains. Legs: She has not had many calf cramps at night since her last visit.  Muscle mass and strength seem normal. There are no complaints of numbness, tingling, or burning. She does not have much edema. She no longer takes HCTZ.  Feet: There are no obvious foot problems. She has occasional edema.  Neuro: Her balance is still not very good.    PAST MEDICAL, FAMILY, AND SOCIAL HISTORY:  Past Medical History:  Diagnosis Date  . Combined hyperlipidemia   . COPD (chronic obstructive pulmonary disease) ([place])   . Dysgeusia   . GERD (gastroesophageal reflux disease)   . Hypercalcemia   . Hyperparathyroidism , secondary, non-renal ([place])   . Hypertension   . Hypothyroidism, acquired, autoimmune   . Iron deficiency   . Lymphoma, mantle cell, multiple sites ([place])   . Nontoxic multinodular goiter   . Obesity (BMI 30.0-34.9)   . Other osteoporosis   . Pallor   . Thyroiditis, autoimmune   . Toxic nodular goiter   . Vitamin D deficiency disease     Family History  Problem Relation Age of Onset  . Diabetes Father   . Cancer Sister   . Heart disease Sister      Current Outpatient Medications:  .  calcitRIOL (ROCALTROL) 0.25 MCG capsule, TAKE 1 CAPSULE TWICE WEEKLY, Disp: 25 capsule, Rfl: 3 .  colesevelam (WELCHOL) 625 MG tablet, Take by mouth 2 (two) times daily with a meal. 3 tablest bid, Disp: , Rfl:  .  ezetimibe (ZETIA) 10 MG tablet, Take 10 mg by mouth daily., Disp: , Rfl:  .  fesoterodine (TOVIAZ) 4 MG TB24 tablet, Take 4 mg by mouth daily., Disp: , Rfl:  .  hydrochlorothiazide (MICROZIDE) 12.5 MG capsule, Take 12.5 mg by mouth daily., Disp: , Rfl:  .  losartan (COZAAR) 25 MG tablet, Take 25 mg by mouth daily., Disp: , Rfl:  .  metoprolol tartrate (LOPRESSOR) 50 MG tablet, Take 50 mg by mouth daily., Disp: , Rfl:  0 .  montelukast (SINGULAIR) 10 MG tablet, Take 10 mg by mouth at bedtime., Disp: , Rfl:  .  omeprazole (PRILOSEC) 10 MG capsule, Take 10 mg by mouth daily.  , Disp: , Rfl:  .  potassium chloride (K-DUR,KLOR-CON) 10 MEQ tablet, , Disp: , Rfl:  .  Vitamin D, Ergocalciferol, (DRISDOL) 50000 units CAPS capsule, , Disp: , Rfl:   Allergies as of 01/08/2019 - Review Complete 07/10/2018  Allergen Reaction Noted  . Codeine  01/27/2011  . Latex  07/10/2018    1. Work and Family: Her family is supportive. Janah lives with her husband, daughter Joy Daniels, and three adult grandsons.    2. Activities: She is trying to walk in the house. 3. Smoking, alcohol, or drugs: None 4. Primary Care Provider: Dr. Reesa Chew, [facility], phone [phone], fax [phone]  REVIEW OF SYSTEMS: There are no other significant problems involving Kaslyn's other body systems.   Objective:  Vital Signs:  There were no vitals taken for this visit. She forgot to take her medications today.    Ht Readings from Last 3 Encounters:  07/10/18 5' (1.524 m)  01/08/18 5' (1.524 m)  10/12/10 5' (1.524 m)   Wt Readings from Last 3 Encounters:  07/10/18 156 lb (70.8 kg)  01/08/18 159 lb (72.1 kg)  07/10/17 163 lb 4.8 oz (74.1 kg)   PHYSICAL EXAM:  On video she looks great. She is alert and very bright. Her sense of humor is wonderful. Her dementia is very mild and subtle today. If I did not observe her carefully I would not be able to tell that she has any dementia.   LAB DATA:   Labs 12/28/18: PTH 66 (ref 15-65), calcium 11.3 (ref 8.7-10.3), 25-OH vitamin D 39.6 (ref 30-100); fasting glucose 100; creatinine 0.94, CO2 30 (ref 20-29); uric  acid 6.4 (ref 2.5-7.1)  Labs 07/10/18: PTH 120 (ref 14-64), calcium 10.8 (ref 8.6-10.4), 25-OH vitamin D 51  Labs 02/23/18: PTH 65, calcium 10.9, 1,25 vitamin D 45 (ref 18-72)   Labs 01/08/18: TSH 2.08, free T4 1.1, free T3 2.2; PTH 68, calcium 10.9, 25-OH vitamin D 37  Labs  07/10/17: TSH 1.96, free T4 1.0, free T3 2.6; CMP normal, except calcium 10.5; 25-OH vitamin D 46; PTH 74 (14-64), vitamin B12 403 (ref 200-1,100), vitamin B6 18.9 (ref 2.1-21.7)  Labs 01/05/17: PTH 71 (ref 14-64), calcium 10.3, 25-OH vitamin D 58, 1,25-dihydroxy vitamin D 56 (ref 18-72)  Labs 10/04/16: Surgical pathology: Peripheral blood flow cytometry: Relative larger amount of T cells than B cells and reversal of the CD4:CD8 ratio  Labs 06/24/16: TSH 2.36, free T4 0,5, free T3 2.5; CMP normal, except for bilirubin 1.6 and ALT 40; 25-OH vitamin D 42  Labs 12/22/15: TSH 2.66, free T4 1.2, free T3 2.4; CMP normal except for AST 39 and ALT 48; PTH 79 (ref 14-64), calcium 10.0,  25-OH vitamin D 47, 1,25-dihydroxy vitamin d 40 (ref 18.72)  Labs: 11/18/14: 25-OH vitamin D 32.9, calcium 10.1; ALT 42; cholesterol 237, triglycerides 290, HDL 46, LDL 133  Labs 08/14/14: 25-OH vitamin D 31.9, calcium 10.6; ALT 33; TSH 3.47   Labs 11/05/13: TSH 2.902, free T4 1.06, free T3 2.5; 1,25-dihydroxy vitamin D 39; 25-hydroxy vitamin D 47, PTH 72, calcium 10.1  Labs 06/11/13: 1,25-dihydroxy vitamin D 39, 25-hydroxy vitamin D 47, PTH 72, calcium 10.1 ; TSH 2.901, free T4 1.06, free T3  2.5  Labs 12/10/12: Hgb 11.1, Hct 33.6%; TSH 2.964, free T4 1.06, free T3 2.4; 25-hydroxy vitamin D 37, PTH 89.4, calcium 9.9; CMP normal  Labs 06/11/12: CMP was normal, to include a calcium of 9.9; TSH 2.195,  free T4 0.98, free T3 2.6; PTH 102.3, 25-hydroxy vitamin D 29, 1,25-dihydroxy vitamin D 57.  Labs 11/17/11: PTH 70.9,calcium 10.0, 25-hydroxy vitamin D 40, 1,25-dihydroxy vitamin D 67, TSH 2.312. Free T4  0.97, Free T3 2.4  Labs 05/19/11: PTH 64, calcium 10.4, 25-hydroxy vitamin D 36, 1,25-dihydroxy vitamin D 47, TSH 2.497, free T4 1.04, free T3 2.6  IMAGING:  Thyroid US 01/25/18: 0.7 cm peripheral calcified nodule, right inferior ; 0.8 cm inferior right nodule with coarse calcifications, previously 1.8 cm, no progression for  greater than 5 years; 0.7 cm peripherally calcified cyst in superior left lobe, stable for greater than 5 years; 0.8 cm hyperechoic nodules in mid left lobe, previously 1.6 cm, stable for greater than 5 years. No nodule met criteria for biopsy.    Assessment and Plan:   ASSESSMENT:  1-3. Obesity/unintentional weight loss/dysgeusia:   A. At her last visit she had lost more weight in the previous 6 months. Food still did not taste very good, but she loved her sweets.  She needed to walk more.   B. After taking vitamin B6 daily, her sense of taste has improved, her appetite is better, and she is eating more.  4. Vitamin D deficiency:   A. The patient's 25-hydroxy vitamin D levels in April 2017, October 2017, April 2018, October 2018, April 2019, October 2019, and April 2020 were normal.  B. Her 1,25-dihydroxy vitamin D (calcitriol) levels in April 2017, April 2018, and June 2019 were also normal while taking calcitriol.   C. The combination of oral vitamin D and oral calcitriol that she was taking in the past had worked for her.  She has recently been taken off  all vitamin D due to having a more elevated calcium level.  5-6. Thyroiditis/multinodular goiter:   A. Her Hashimoto's disease was quiescent at her last 2 visits and is quiescent today. Her thyroid gland was again within normal limits for size at her last visit. The episodic "tenderness" that she has had previously and the process of waxing and waning of thyroid gland size are c/w thyroiditis.    B. She was euthyroid again in April and October 2017, in October 2018, and in April 2019.   C. Her thyroid US in May 2019 showed several unremarkable small nodules.  7. Dyspepsia: She is doing better on omeprazole. 8-9. Hyperparathyroidism, secondary to calcium and vitamin D intake deficiency/hypercalcemia:   A. Her PTH had been mildly elevated for several years, but her calciums had been in the upper quartile of the normal range or slightly elevated.    B. Her calcium in October 2018 was at the upper end of the normal range. Her calcium levels in April, June, and October 2019 were mildly elevated. Her PTH in October was very high. It appeared at that time that she might be slowly converting from secondary hyperparathyroidism to tertiary hyperparathyroidism.   C. In April 2020 her PTH decreased to just above the upper limit of normal, but her calcium was much higher. These findings suggest that her parathyroid glands will respond to her ambient calcium level, although imperfectly.   D. While I understand why her new PCP stopped the calcium, vitamin D, and calcitriol medications, I suspect that these changes will cause her secondary/tertiary hyperparathyroidism to worsen.  If so, it will be important to keep her calcium levels relatively high and her vitamin D levels normal in order to control the PTH. We need to find a calcium/vitamin D/calcitriol regimen that controls the PTH without causing too high a calcium level. If we and she are lucky, the calcium levels will not increase too much and she will not need parathyroidectomy.   E. We also need to perform a new thyroid US looking for a parathyroid adenoma. We may also need to perform a sestamibi scan.   10. Fatigue, other: Patient's fatigue is still present, which I suspect is due in part to her chemotherapy. She is also very deconditioned. She needs to walk more.   11. Lymphoma: Her lymphoma is in remission, which is wonderful.  12. Colon cancer: Her colon cancer is also in remission, which is also wonderful.  13. Calf cramps and restless legs:  These problems have resolved. I again  recommended that she stretch the calves before going to bed.  14. Memory problem: She appears to be doing pretty well overall, but needs ongoing support from [place].   15. Combined hyperlipidemia: Dr. Rica Records was following this problem for her.  16. Pallor: She will need a CBC and CMP when she sees her oncologist. 17. Hair  loss: Her scalp is somewhat thin, but I'm not sure that there has been any real change at her last several visits. I have talked with her about Rogaine for Women in the past, but she declined. She says today that her hair is unchanged. Joy Daniels concurred.    PLAN:  1. Diagnostic: Repeat calcium, PTH, 25-OH vitamin D, and the calcitriol level in 2 months. Repeat US of the thyroid gland to lok for a parathyroid adenoma. We many also need a sestamibi scan.   2. Therapeutic: Continue to hold calcitriol and calcium to see if the calcium level decreases without causing too  much of an increase in PTH. I would resume the vitamin B12 and vitamin D for now. Continue to take a multivitamin pill and one vitamin B6 capsule daily 3. 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 We need to keep up her intake of calcium and vitamin D to the extent that we can, but we also need to control the PTH as well as we can. Unfortunately, if her PTH function worsens she might need parathyroid surgery. 4. Follow-up: 3 months  Level of Service: This visit lasted in excess of 50 minutes. More than 50% of the visit was devoted to counseling.  Tillman Sers, MD, CDE Adult and Pediatric Endocrinology 01/08/2019 9:51 AM    This is a Pediatric Specialist E-Visit follow up consult provided via WebEx. [name] and her daughter, Joy Daniels consented to an E-Visit consult today.  Location of patient: [place] and Joy Daniels are at her home.  Location of provider: Renee Rival is at his [facility] office.  Patient was referred by Nicholos Johns, MD   The following participants were involved in this E-Visit: Ms. Nicodemus, her daughter, and Dr. Tobe Sos  Chief Complain/ Reason for E-Visit today: secondary hyperparathyroidism, vitamin d deficiency, goiter, thyroiditis, dyspepsia, dysgeusia, peripheral neuropathy Total time on call: 50  minutes Follow up: 3 months

## 2019-01-22 DIAGNOSIS — B029 Zoster without complications: Secondary | ICD-10-CM | POA: Diagnosis not present

## 2019-01-24 DIAGNOSIS — Z452 Encounter for adjustment and management of vascular access device: Secondary | ICD-10-CM | POA: Diagnosis not present

## 2019-01-24 DIAGNOSIS — R4182 Altered mental status, unspecified: Secondary | ICD-10-CM | POA: Diagnosis not present

## 2019-01-24 DIAGNOSIS — B029 Zoster without complications: Secondary | ICD-10-CM | POA: Diagnosis not present

## 2019-01-24 DIAGNOSIS — R531 Weakness: Secondary | ICD-10-CM | POA: Diagnosis not present

## 2019-01-24 DIAGNOSIS — E860 Dehydration: Secondary | ICD-10-CM | POA: Diagnosis not present

## 2019-03-29 DIAGNOSIS — I10 Essential (primary) hypertension: Secondary | ICD-10-CM | POA: Diagnosis not present

## 2019-03-29 DIAGNOSIS — E782 Mixed hyperlipidemia: Secondary | ICD-10-CM | POA: Diagnosis not present

## 2019-03-29 DIAGNOSIS — R2689 Other abnormalities of gait and mobility: Secondary | ICD-10-CM | POA: Diagnosis not present

## 2019-03-29 DIAGNOSIS — B0229 Other postherpetic nervous system involvement: Secondary | ICD-10-CM | POA: Diagnosis not present

## 2019-03-29 DIAGNOSIS — R350 Frequency of micturition: Secondary | ICD-10-CM | POA: Diagnosis not present

## 2019-03-30 DIAGNOSIS — S0990XA Unspecified injury of head, initial encounter: Secondary | ICD-10-CM | POA: Diagnosis not present

## 2019-03-30 DIAGNOSIS — S0103XA Puncture wound without foreign body of scalp, initial encounter: Secondary | ICD-10-CM | POA: Diagnosis not present

## 2019-03-30 DIAGNOSIS — I10 Essential (primary) hypertension: Secondary | ICD-10-CM | POA: Diagnosis not present

## 2019-03-30 DIAGNOSIS — Z79899 Other long term (current) drug therapy: Secondary | ICD-10-CM | POA: Diagnosis not present

## 2019-03-30 DIAGNOSIS — S0003XA Contusion of scalp, initial encounter: Secondary | ICD-10-CM | POA: Diagnosis not present

## 2019-03-30 DIAGNOSIS — S199XXA Unspecified injury of neck, initial encounter: Secondary | ICD-10-CM | POA: Diagnosis not present

## 2019-03-30 DIAGNOSIS — M1990 Unspecified osteoarthritis, unspecified site: Secondary | ICD-10-CM | POA: Diagnosis not present

## 2019-04-02 DIAGNOSIS — R296 Repeated falls: Secondary | ICD-10-CM | POA: Diagnosis not present

## 2019-04-02 DIAGNOSIS — Z9181 History of falling: Secondary | ICD-10-CM | POA: Diagnosis not present

## 2019-04-02 DIAGNOSIS — R531 Weakness: Secondary | ICD-10-CM | POA: Diagnosis not present

## 2019-04-02 DIAGNOSIS — R262 Difficulty in walking, not elsewhere classified: Secondary | ICD-10-CM | POA: Diagnosis not present

## 2019-04-02 DIAGNOSIS — R2689 Other abnormalities of gait and mobility: Secondary | ICD-10-CM | POA: Diagnosis not present

## 2019-04-03 DIAGNOSIS — R296 Repeated falls: Secondary | ICD-10-CM | POA: Diagnosis not present

## 2019-04-03 DIAGNOSIS — R531 Weakness: Secondary | ICD-10-CM | POA: Diagnosis not present

## 2019-04-03 DIAGNOSIS — R262 Difficulty in walking, not elsewhere classified: Secondary | ICD-10-CM | POA: Diagnosis not present

## 2019-04-03 DIAGNOSIS — R2689 Other abnormalities of gait and mobility: Secondary | ICD-10-CM | POA: Diagnosis not present

## 2019-04-03 DIAGNOSIS — Z9181 History of falling: Secondary | ICD-10-CM | POA: Diagnosis not present

## 2019-04-05 DIAGNOSIS — Z9181 History of falling: Secondary | ICD-10-CM | POA: Diagnosis not present

## 2019-04-05 DIAGNOSIS — R2689 Other abnormalities of gait and mobility: Secondary | ICD-10-CM | POA: Diagnosis not present

## 2019-04-05 DIAGNOSIS — R531 Weakness: Secondary | ICD-10-CM | POA: Diagnosis not present

## 2019-04-05 DIAGNOSIS — R296 Repeated falls: Secondary | ICD-10-CM | POA: Diagnosis not present

## 2019-04-05 DIAGNOSIS — R262 Difficulty in walking, not elsewhere classified: Secondary | ICD-10-CM | POA: Diagnosis not present

## 2019-04-09 DIAGNOSIS — R296 Repeated falls: Secondary | ICD-10-CM | POA: Diagnosis not present

## 2019-04-09 DIAGNOSIS — R262 Difficulty in walking, not elsewhere classified: Secondary | ICD-10-CM | POA: Diagnosis not present

## 2019-04-09 DIAGNOSIS — R531 Weakness: Secondary | ICD-10-CM | POA: Diagnosis not present

## 2019-04-09 DIAGNOSIS — Z9181 History of falling: Secondary | ICD-10-CM | POA: Diagnosis not present

## 2019-04-09 DIAGNOSIS — R2689 Other abnormalities of gait and mobility: Secondary | ICD-10-CM | POA: Diagnosis not present

## 2019-04-11 ENCOUNTER — Other Ambulatory Visit: Payer: Self-pay

## 2019-04-11 ENCOUNTER — Ambulatory Visit (INDEPENDENT_AMBULATORY_CARE_PROVIDER_SITE_OTHER): Payer: Medicare PPO | Admitting: "Endocrinology

## 2019-04-11 ENCOUNTER — Encounter (INDEPENDENT_AMBULATORY_CARE_PROVIDER_SITE_OTHER): Payer: Self-pay | Admitting: "Endocrinology

## 2019-04-11 VITALS — BP 132/78 | HR 84 | Wt 142.8 lb

## 2019-04-11 DIAGNOSIS — E042 Nontoxic multinodular goiter: Secondary | ICD-10-CM | POA: Diagnosis not present

## 2019-04-11 DIAGNOSIS — N2581 Secondary hyperparathyroidism of renal origin: Secondary | ICD-10-CM

## 2019-04-11 DIAGNOSIS — R634 Abnormal weight loss: Secondary | ICD-10-CM

## 2019-04-11 DIAGNOSIS — R1013 Epigastric pain: Secondary | ICD-10-CM

## 2019-04-11 DIAGNOSIS — C8318 Mantle cell lymphoma, lymph nodes of multiple sites: Secondary | ICD-10-CM

## 2019-04-11 DIAGNOSIS — C189 Malignant neoplasm of colon, unspecified: Secondary | ICD-10-CM | POA: Diagnosis not present

## 2019-04-11 DIAGNOSIS — R630 Anorexia: Secondary | ICD-10-CM | POA: Diagnosis not present

## 2019-04-11 DIAGNOSIS — E063 Autoimmune thyroiditis: Secondary | ICD-10-CM | POA: Diagnosis not present

## 2019-04-11 DIAGNOSIS — R262 Difficulty in walking, not elsewhere classified: Secondary | ICD-10-CM | POA: Diagnosis not present

## 2019-04-11 DIAGNOSIS — Z9181 History of falling: Secondary | ICD-10-CM | POA: Diagnosis not present

## 2019-04-11 DIAGNOSIS — R2689 Other abnormalities of gait and mobility: Secondary | ICD-10-CM | POA: Diagnosis not present

## 2019-04-11 DIAGNOSIS — E559 Vitamin D deficiency, unspecified: Secondary | ICD-10-CM

## 2019-04-11 DIAGNOSIS — R531 Weakness: Secondary | ICD-10-CM | POA: Diagnosis not present

## 2019-04-11 DIAGNOSIS — R413 Other amnesia: Secondary | ICD-10-CM

## 2019-04-11 DIAGNOSIS — R296 Repeated falls: Secondary | ICD-10-CM | POA: Diagnosis not present

## 2019-04-11 NOTE — Patient Instructions (Signed)
Follow up visit in 3 months. 

## 2019-04-11 NOTE — Progress Notes (Signed)
Subjective:  Patient Name: Joy Daniels Date of Birth: 1941-02-08  MRN: [id number]  Joy Daniels  presents at today's clinic visit for follow-up of her secondary hyperparathyroidism, hypocalcemia, vitamin D deficiency, iron deficiency anemia, dysgeusia, dyspepsia, intermittent abnormal TFTs/hypothyroidism, thyroiditis, nontoxic multinodular goiter, s/p colon cancer, s/p mantle cell lymphoma, and mild dementia.  HISTORY OF PRESENT ILLNESS:   Joy Daniels is a 78 y.o. Caucasian woman.  Malijah was accompanied by her daughter, [name].   1. Ms. Kreiter was first referred to me on 02/01/05 by her primary care physician, Dr. Micheal Likens, for follow up of toxic nodular goiter. The patient was then 78 years of age.   A.  The patient had been previously evaluated by Dr. Genelle Bal, an adult endocrinologist based in [place]. Dr. London Pepper was performing an outreach clinic at the [facility] in [place], [place]. He evaluated the patient for hyperthyroidism and found that she had a toxic nodular goiter. He arranged for her to have radioactive iodine treatment on 12/13/04 with 24.5 mCi of I-131. When Dr. London Pepper retired, I was asked to handle the outreach clinic for the next several months. At the patient's first visit with me, many of her hyperthyroid symptoms and signs had significantly improved. Past medical history was positive for hypertension, osteoporosis, hip pains, dyspepsia, and GERD. She had a previous injury to her right hand that required surgical repair. Family history was positive for cancer in one sister and a heart attack in another sister. Her brother had diabetes and had a stroke. On physical examination she was hypertensive. She had a 25+ gram goiter. She also had pallor of the nailbeds of her fingers.   B. At her next follow up visit in [place] on 03/29/05, she felt much better. Her energy was much improved. Muscle strength was improving. She was still significantly bothered by her excessive belly hunger that  was causing weight gain. TFTs from 03/22/05 were normal, with a TSH of 1.11, a free T4 of 1.22, and a free T3 of 2.8. I started her on Nexium 40 mg per day. Since I was getting ready to terminate the outreach clinic in [place], I made arrangements for her to be followed at my clinic in [place].   2. During the last 14 years that I have followed her here in [place], we have dealt with several different problems:  A. I followed two aspects of her multinodular goiter, the nodules themselves and her thyroid hormone status.   1. A thyroid ultrasound performed on 10/26/06 showed a 1.5 cm dominant hyperechoic mass in the left upper pole. There were 2 smaller nodules in the right lobe measuring 6.9 and 7.6 mm in diameter. On 12/14/06 a fine needle aspirate was performed on the dominant nodule in the left upper lobe. The pathologists diagnosis was: "Rare benign follicular cells, abundant histiocytes and colloid. No neoplasms identified." A follow up thyroid ultrasound performed on 11/05/07 showed an echogenic nodule in the mid left lobe measuring 1.7 x 1.1 x 0.8 cm. This was not grossly changed from images obtained during the previous fine needle aspirate. There was also a smaller nodule in the left lobe measuring 1.3 x 0.6 cm.  In the inferior aspect of the right lobe there was a nodule measuring 1.1 x 0.7 x 0.5. Her next thyroid ultrasound performed on 01/14/2009 showed a partially calcified nodule in the lower pole of the right lobe measuring 1.8 x 0.6 x 0.8. By report, this nodule had increased somewhat in size. However, because the images from  the 2009 study were not available, no direct comparison could be made. The largest nodule in the left lobe measured 1.6 x 1.0 x 0.8., essentially unchanged from the previous study. Since her clinical exam has been stable since 2010 I did not repeat her thyroid US until 01/25/18. As shown below, her two nodules that were >1 cm in diameter had shrunk down to <1 cm in diameter.  None of her nodules look suspicious or met criteria for biopsy.    2. The patient remained euthyroid through the spring of 2010. In September of 2010 and again in April of 2011 she became mildly hypothyroid. It appeared that she was having some flare ups of Hashimoto's disease at those times. Her TFTs subsequently normalized on 11/26/10.  B. Because of her known osteoporosis, I wanted to treat her with a bisphosphonate. Since she had previously not tolerated Actonel well, she was reluctant. I finally convinced her to try Boniva, but she later discontinued that medication as well. She was not at all interested in doing Forteo injections. To further try to minimize bone loss, I monitored her bone mineral parameters closely. Up through her visit on 04/17/07 her parameters were normal, but she had many difficulties in taking all of her calcium and vitamin D pills, in part due to constipation. Unfortunately, by 12/03/07 her 25-hydroxy vitamin D dropped to 24.2 and her 1,25- dihydroxy vitamin D dropped to 23.3. It appeared not only was she deficient in vitamin D intake, but her kidneys were also not optimally converting 25-hydroxy vitamin D to 1, 25-dihydroxy hydroxy vitamin D. I prescribed vitamin D, 50,000 IU per week. I also added calcitriol, 0.25 mcg per week. When she took these medications, her bone parameters normalized. In early 2010, however, she had stopped taking these medications. Her PTH rose to 75 and then to 81.8. When she again took her calcium and her two vitamin D preparations, her PTH decreased progressively to 62.1 and then to 33 on 11/26/10. On that date, her serum calcium was 10.2, her 25-hydroxy vitamin D was 32.2, and her 1, 25- dihydroxy vitamin D was 28.2. By then, however, she had stopped taking the vitamin D 50,000 international units weekly because her insurance company would not cover it. Her calcium levels, vitamin D levels, and PTH levels have fluctuated ever since, usually varying with her  intakes of calcium, vitamin D, and calcitriol. Due to concerns about hypercalcemia, her new PCP, Dr. Reesa Chew, stopped her calcitriol and vitamin D. I later had her re-start the vitamin D.  C. 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  D. In late 2014 she developed cancer of the ascending colon and was successfully treated with surgery.    3. The patient's last [facility] visit was on 01/08/19. At that visit we continued all of her medications, to include calcitriol twice weekly and vitamin B6 daily.   A. In the interim, she has been fairly healthy, except for shingles in the upper right back in March. She still has some discomfort in that area.   B. She has fallen two or three more times, associated with suddenly standing up and losing her balance. On one occasion she hit her head when she fell. She often forgets to hold onto something when she first stands up. She still  walks with a shuffling gait and foot drop, so she has often fallen or stumbled in the past. Both [name] and I have encouraged her to walk heel to toe, but she just does not do so. She is in PT now.   C. She has developed some memory problems over time. She often forgets doctors' appointments and taking medications. Her distant memory is intact. Her recent memory is not so good. She frequently can't remember much that happens day to day. Her personality, social skills, and intelligence are so good, however, that she appears to be quite normal. [name] feels that she forgets some things, but not others. [name] feels that mom's recent memory is often pretty good   D. [name] is trying to help her mother do what she needs to do, but not always successfully. Mom still sometimes gets angry with [name].   E. Ms Monahan  continues to have urinary incontinence, but not as bad. She is now taking Topiaz which helps her sensation of bladder fullness.   F. She shows no signs of recurrence of her mantle cell lymphoma or her colon cancer. She is overdue for her 6 month oncology follow up.    G. Her energy is not too good. She joked, "I can sleep all day." She sleeps better during the night when she takes gabapentin. She stays cold all the time. She is not walking much.    H. She has not had many attacks of shortness of breath recently. She says that her COPD is worse when she lays down. She still becomes somewhat short of breath if she is too active. She has not needed to use either her inhaler or her nebulizer.    I. 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 She uses a walker when she walks outside the house, for example, to go shopping.    J. She has a new PCP, Dr. Reesa Chew, who discontinued her calcitriol, 0.25 mcg, twice weekly; Citracal with D twice daily, metoprolol, HCTZ, Montelukast, and vitamin D, 50,000 IU weekly; and B12,  She still takes omeprazole 40, once daily; KCl ER 10 mEq, twice daily; losartan 25 mg/day; ezetimibe, 10 mg/day; and vitamin B6 daily.   She also resumed vitamin D, 50,000 IU per week.   K. Food no longer tastes as good, but she is still eating pretty good. She eats two meals a day: breakfast/brunch well and dinner. [name] thinks that her appetite has improved since being on B6.   L. Ezelle says that her hair is growing out more, but is harder to manage.   4. Pertinent Review of Systems:  Constitutional: Ms. Farooq has a cane and a walker that she uses when she goes out, but not often at home.   Eyes: Vision is better since having bilateral cataractectomies, but the vision is not as good in her right eye. She had an eye appointment in the summer of 2019, but no changes were made in her glasses. She does not wear the glasses often.  The ophthalmologist recommended eye drops, but she doesn't use them. She has a follow up appointment in December.  Mouth: Mouth is no longer dry.    Neck: She has not had any thyroid pains or inflammation. She occasionally still has left postero-lateral neck pains, as she has had off and on for years. Her range of motion of the cervical spine is much reduced. She no longer has difficulty swallowing unless her  mouth gets too dry.    Heart: She is not aware of any palpitations, chest pain, or chest pressure.  Lungs: Her COPD is probably unchanged. She still gets short of breath with minor exertion.  Gastrointestinal: She is constipated sometimes. She no longer takes Miralax and Benefiber proactively, but only takes these fiber supplements now when she feels constipated. She has no complaints of excessive hunger, acid reflux, upset stomach, stomach aches or pains. Legs: She has not had many calf cramps at night since her last visit.  Muscle mass and strength seem normal. There are no complaints of numbness, tingling, or burning. She does not have much edema. She no longer takes HCTZ.  Feet: There are no obvious foot problems. She has occasional edema.  Neuro: Her balance is still not very good.    PAST MEDICAL, FAMILY, AND SOCIAL HISTORY:  Past Medical History:  Diagnosis Date  . Combined hyperlipidemia   . COPD (chronic obstructive pulmonary disease) ([place])   . Dysgeusia   . GERD (gastroesophageal reflux disease)   . Hypercalcemia   . Hyperparathyroidism , secondary, non-renal ([place])   . Hypertension   . Hypothyroidism, acquired, autoimmune   . Iron deficiency   . Lymphoma, mantle cell, multiple sites ([place])   . Nontoxic multinodular goiter   . Obesity (BMI 30.0-34.9)   . Other osteoporosis   . Pallor   . Thyroiditis, autoimmune   . Toxic nodular goiter   . Vitamin D deficiency disease     Family History  Problem Relation Age of Onset  . Diabetes Father   . Cancer Sister   . Heart  disease Sister      Current Outpatient Medications:  .  aspirin 81 MG chewable tablet, Chew by mouth daily., Disp: , Rfl:  .  ezetimibe (ZETIA) 10 MG tablet, Take 10 mg by mouth daily., Disp: , Rfl:  .  gabapentin (NEURONTIN) 300 MG capsule, Take 300 mg by mouth 2 (two) times daily., Disp: , Rfl:  .  loratadine (CLARITIN) 10 MG tablet, Take 10 mg by mouth daily., Disp: , Rfl:  .  metoprolol tartrate (LOPRESSOR) 50 MG tablet, Take 50 mg by mouth daily., Disp: , Rfl: 0 .  omeprazole (PRILOSEC) 10 MG capsule, Take 10 mg by mouth daily.  , Disp: , Rfl:  .  potassium chloride (K-DUR,KLOR-CON) 10 MEQ tablet, , Disp: , Rfl:  .  pyridoxine (B-6) 100 MG tablet, Take 100 mg by mouth daily., Disp: , Rfl:  .  Vitamin D, Ergocalciferol, (DRISDOL) 50000 units CAPS capsule, , Disp: , Rfl:  .  calcitRIOL (ROCALTROL) 0.25 MCG capsule, TAKE 1 CAPSULE TWICE WEEKLY (Patient not taking: Reported on 01/08/2019), Disp: 25 capsule, Rfl: 3 .  fesoterodine (TOVIAZ) 4 MG TB24 tablet, Take 4 mg by mouth daily., Disp: , Rfl:   Allergies as of 04/11/2019 - Review Complete 04/11/2019  Allergen Reaction Noted  . Codeine  01/27/2011  . Latex  07/10/2018    1. Work and Family: Her family is supportive. Amiliah lives with her husband, daughter [name], and three adult grandsons.    2. Activities: She is trying to walk in the house. 3. Smoking, alcohol, or drugs: None 4. Primary Care Provider: Dr. Reesa Chew, [facility], phone [phone], fax [phone]  REVIEW OF SYSTEMS: There are no other significant problems involving Daysi's other body systems.   Objective:  Vital Signs:  BP 132/78   Pulse 84   Wt 142 lb 12.8 oz (64.8 kg)   BMI 27.89  kg/m  She took her medications late today.    Ht Readings from Last 3 Encounters:  07/10/18 5' (1.524 m)  01/08/18 5' (1.524 m)  10/12/10 5' (1.524 m)   Wt Readings from Last 3 Encounters:  04/11/19 142 lb 12.8 oz (64.8 kg)  07/10/18 156 lb (70.8 kg)  01/08/18 159  lb (72.1 kg)   PHYSICAL EXAM:  Meliya looks pretty good overall today. She is alert and bright. Her dementia is very mild and subtle today. If I did not observe her carefully I would not be able to tell that she has any dementia. She has lost 14 pounds since October 2019.  Eyes: There is no arcus or proptosis.  Mouth: The oropharynx appears normal. The tongue appears normal. There is normal oral moisture. There is no obvious gingivitis. Neck: There are no bruits present. The thyroid gland appears normal in size. The thyroid gland is normal at approximately 18 grams in size. The consistency of the thyroid gland is normal. There is no thyroid tenderness to palpation. Lungs: The lungs are clear. Air movement is good. Heart: The heart rhythm and rate appear normal. Heart sounds S1 and S2 are normal. I do not appreciate any pathologic heart murmurs. Abdomen: The abdominal size is enlarged. Bowel sounds are normal. The abdomen is soft and non-tender. There is no obviously palpable hepatomegaly, splenomegaly, or other masses.  Arms: Muscle mass appears appropriate for age. Hands: There is a 1+ hand tremor. Phalangeal and metacarpophalangeal joints appear normal. Palms are normal. Legs: Muscle mass appears appropriate for age. There is no edema.  Neurologic: Muscle strength is normal for age and gender  in both the upper and the lower extremities. Muscle tone appears normal. Sensation to touch is normal in the legs and feet.  LAB DATA:   Labs 12/28/18: PTH 66 (ref 15-65), calcium 11.3 (ref 8.7-10.3), 25-OH vitamin D 39.6 (ref 30-100); fasting glucose 100; creatinine 0.94, CO2 30 (ref 20-29); uric acid 6.4 (ref 2.5-7.1)  Labs 07/10/18: PTH 120 (ref 14-64), calcium 10.8 (ref 8.6-10.4), 25-OH vitamin D 51  Labs 02/23/18: PTH 65, calcium 10.9, 1,25 vitamin D 45 (ref 18-72)   Labs 01/08/18: TSH 2.08, free T4 1.1, free T3 2.2; PTH 68, calcium 10.9, 25-OH vitamin D 37  Labs 07/10/17: TSH 1.96, free T4 1.0,  free T3 2.6; CMP normal, except calcium 10.5; 25-OH vitamin D 46; PTH 74 (14-64), vitamin B12 403 (ref 200-1,100), vitamin B6 18.9 (ref 2.1-21.7)  Labs 01/05/17: PTH 71 (ref 14-64), calcium 10.3, 25-OH vitamin D 58, 1,25-dihydroxy vitamin D 56 (ref 18-72)  Labs 10/04/16: Surgical pathology: Peripheral blood flow cytometry: Relative larger amount of T cells than B cells and reversal of the CD4:CD8 ratio  Labs 06/24/16: TSH 2.36, free T4 0,5, free T3 2.5; CMP normal, except for bilirubin 1.6 and ALT 40; 25-OH vitamin D 42  Labs 12/22/15: TSH 2.66, free T4 1.2, free T3 2.4; CMP normal except for AST 39 and ALT 48; PTH 79 (ref 14-64), calcium 10.0,  25-OH vitamin D 47, 1,25-dihydroxy vitamin d 40 (ref 18.72)  Labs: 11/18/14: 25-OH vitamin D 32.9, calcium 10.1; ALT 42; cholesterol 237, triglycerides 290, HDL 46, LDL 133  Labs 08/14/14: 25-OH vitamin D 31.9, calcium 10.6; ALT 33; TSH 3.47   Labs 11/05/13: TSH 2.902, free T4 1.06, free T3 2.5; 1,25-dihydroxy vitamin D 39; 25-hydroxy vitamin D 47, PTH 72, calcium 10.1  Labs 06/11/13: 1,25-dihydroxy vitamin D 39, 25-hydroxy vitamin D 47, PTH 72, calcium 10.1 ; TSH 2.901,  free T4 1.06, free T3  2.5  Labs 12/10/12: Hgb 11.1, Hct 33.6%; TSH 2.964, free T4 1.06, free T3 2.4; 25-hydroxy vitamin D 37, PTH 89.4, calcium 9.9; CMP normal  Labs 06/11/12: CMP was normal, to include a calcium of 9.9; TSH 2.195,  free T4 0.98, free T3 2.6; PTH 102.3, 25-hydroxy vitamin D 29, 1,25-dihydroxy vitamin D 57.  Labs 11/17/11: PTH 70.9,calcium 10.0, 25-hydroxy vitamin D 40, 1,25-dihydroxy vitamin D 67, TSH 2.312. Free T4  0.97, Free T3 2.4  Labs 05/19/11: PTH 64, calcium 10.4, 25-hydroxy vitamin D 36, 1,25-dihydroxy vitamin D 47, TSH 2.497, free T4 1.04, free T3 2.6  IMAGING:  Thyroid US 01/25/18: 0.7 cm peripheral calcified nodule, right inferior ; 0.8 cm inferior right nodule with coarse calcifications, previously 1.8 cm, no progression for greater than 5 years; 0.7 cm  peripherally calcified cyst in superior left lobe, stable for greater than 5 years; 0.8 cm hyperechoic nodules in mid left lobe, previously 1.6 cm, stable for greater than 5 years. No nodule met criteria for biopsy.    Assessment and Plan:   ASSESSMENT:  1-3. Obesity/unintentional weight loss/dysgeusia:   A. She had lost more weight in the past 9 months. Food still does not taste very good, but she still loves her sweets. Her tongue looks normal, so she does not have any obvious glossitis.   B. After taking vitamin B6 daily, her sense of taste has improved, but is still relatively low.   4. Vitamin D deficiency:   A. The patient's 25-hydroxy vitamin D levels in April 2017, October 2017, April 2018, October 2018, April 2019, October 2019, and April 2020 were normal.  B. Her 1,25-dihydroxy vitamin D (calcitriol) levels in April 2017, April 2018, and June 2019 were also normal while taking calcitriol.   C. The combination of oral vitamin D and oral calcitriol that she was taking in the past had worked for her.  She has recently been taken off all vitamin D and calcium due to having a more elevated calcium level, but later resumed weekly vitamin D. She was told recently that her recent lab tests were good.  5-6. Thyroiditis/multinodular goiter:   A. Her Hashimoto's disease was clinically quiescent at her last 2 visits and is quiescent today. Her thyroid gland was again within normal limits for size at her last visit and again today. The episodic "tenderness" that she has had previously and the process of waxing and waning of thyroid gland size are c/w thyroiditis.    B. She was euthyroid again in April and October 2017, in October 2018, and in April 2019.   C. Her thyroid US in May 2019 showed several unremarkable small nodules.   D. If she had TFTs done recently then I don't need to draw them. I asked [name] to have Dr. Latina Craver office fax her lab results to me.  7. Dyspepsia: She is doing better on  omeprazole. 8-9. Hyperparathyroidism, secondary to calcium and vitamin D intake deficiency/hypercalcemia:   A. Her PTH had been mildly elevated for several years, but her calciums had been in the upper quartile of the normal range or slightly elevated.   B. Her calcium in October 2018 was at the upper end of the normal range. Her calcium levels in April, June, and October 2019 were mildly elevated. Her PTH in October was very high. It appeared at that time that she might be slowly converting from secondary hyperparathyroidism to tertiary hyperparathyroidism.   C. In April 2020 her PTH  decreased to just above the upper limit of normal, but her calcium was much higher. These findings suggest that her parathyroid glands will respond to her ambient calcium level, although imperfectly.   D. While I understand why her new PCP stopped the calcium, vitamin D, and calcitriol medications, I suspect that these changes will cause her secondary/tertiary hyperparathyroidism to worsen.  If so, it will be important to keep her calcium levels relatively high and her vitamin D levels normal in order to control the PTH. We need to find a calcium/vitamin D/calcitriol regimen that controls the PTH without causing too high a calcium level. If we and she are lucky, the calcium levels will not increase too much and she will not need parathyroidectomy.   E. Depending upon what her recent lab results are, we may need to perform additional lab studies and perhaps a new thyroid US looking for a parathyroid adenoma or a sestamibi scan.   10. Fatigue, other: Patient's fatigue is still present, which I suspect is due in part to her chemotherapy. She is also very deconditioned. She needs to walk more.   11. Lymphoma: Her lymphoma is in remission, which is wonderful.  12. Colon cancer: Her colon cancer is also in remission, which is also wonderful.  13. Calf cramps and restless legs:  These problems have resolved. I again  recommended  that she stretch the calves before going to bed.  14. Memory problem: She appears to be doing pretty well overall, but needs ongoing support from [place].   15. Combined hyperlipidemia: Dr. Reesa Chew is following this problem for her.  16. Pallor: She will need a CBC and CMP when she sees her oncologist. 17. Hair loss: Her scalp is somewhat thin, but there has been any real change at her last several visits. I have talked with her about Rogaine for Women in the past, but she declined. She says today that her hair is unchanged. [name] concurred.    PLAN:  1. Diagnostic: If needed, repeat calcium, PTH, 25-OH vitamin D, and the calcitriol level in 2 months. If needed, repeat US of the thyroid gland to lok for a parathyroid adenoma. We many also need a sestamibi scan.   2. Therapeutic: Continue to hold calcitriol and calcium to see if the calcium level decreases without causing too much of an increase in PTH. I would resume the vitamin B12 and continue vitamin D for now. Continue to take a multivitamin pill and one vitamin B6 capsule daily 3. 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 We need to keep up her intake of calcium and vitamin D to the extent that we can, but we also need to control the PTH as well as we can. Unfortunately, if her PTH function worsens she might need parathyroid surgery. 4. Follow-up: 3 months  Level of Service: This visit lasted in excess of 60 minutes. More than 50% of the visit was devoted to counseling.  Tillman Sers, MD, CDE Adult and Pediatric Endocrinology 04/11/2019 9:45 AM

## 2019-04-12 DIAGNOSIS — R2689 Other abnormalities of gait and mobility: Secondary | ICD-10-CM | POA: Diagnosis not present

## 2019-04-12 DIAGNOSIS — R262 Difficulty in walking, not elsewhere classified: Secondary | ICD-10-CM | POA: Diagnosis not present

## 2019-04-12 DIAGNOSIS — Z9181 History of falling: Secondary | ICD-10-CM | POA: Diagnosis not present

## 2019-04-12 DIAGNOSIS — R531 Weakness: Secondary | ICD-10-CM | POA: Diagnosis not present

## 2019-04-12 DIAGNOSIS — R296 Repeated falls: Secondary | ICD-10-CM | POA: Diagnosis not present

## 2019-04-15 DIAGNOSIS — Z9181 History of falling: Secondary | ICD-10-CM | POA: Diagnosis not present

## 2019-04-15 DIAGNOSIS — R262 Difficulty in walking, not elsewhere classified: Secondary | ICD-10-CM | POA: Diagnosis not present

## 2019-04-15 DIAGNOSIS — R531 Weakness: Secondary | ICD-10-CM | POA: Diagnosis not present

## 2019-04-15 DIAGNOSIS — R2689 Other abnormalities of gait and mobility: Secondary | ICD-10-CM | POA: Diagnosis not present

## 2019-04-15 DIAGNOSIS — R296 Repeated falls: Secondary | ICD-10-CM | POA: Diagnosis not present

## 2019-04-17 ENCOUNTER — Other Ambulatory Visit (INDEPENDENT_AMBULATORY_CARE_PROVIDER_SITE_OTHER): Payer: Self-pay | Admitting: *Deleted

## 2019-04-17 DIAGNOSIS — R296 Repeated falls: Secondary | ICD-10-CM | POA: Diagnosis not present

## 2019-04-17 DIAGNOSIS — Z9181 History of falling: Secondary | ICD-10-CM | POA: Diagnosis not present

## 2019-04-17 DIAGNOSIS — R262 Difficulty in walking, not elsewhere classified: Secondary | ICD-10-CM | POA: Diagnosis not present

## 2019-04-17 DIAGNOSIS — R2689 Other abnormalities of gait and mobility: Secondary | ICD-10-CM | POA: Diagnosis not present

## 2019-04-17 DIAGNOSIS — R531 Weakness: Secondary | ICD-10-CM | POA: Diagnosis not present

## 2019-04-19 DIAGNOSIS — R296 Repeated falls: Secondary | ICD-10-CM | POA: Diagnosis not present

## 2019-04-19 DIAGNOSIS — R2689 Other abnormalities of gait and mobility: Secondary | ICD-10-CM | POA: Diagnosis not present

## 2019-04-19 DIAGNOSIS — Z9181 History of falling: Secondary | ICD-10-CM | POA: Diagnosis not present

## 2019-04-19 DIAGNOSIS — R262 Difficulty in walking, not elsewhere classified: Secondary | ICD-10-CM | POA: Diagnosis not present

## 2019-04-19 DIAGNOSIS — R531 Weakness: Secondary | ICD-10-CM | POA: Diagnosis not present

## 2019-04-22 DIAGNOSIS — R296 Repeated falls: Secondary | ICD-10-CM | POA: Diagnosis not present

## 2019-04-22 DIAGNOSIS — R531 Weakness: Secondary | ICD-10-CM | POA: Diagnosis not present

## 2019-04-22 DIAGNOSIS — R262 Difficulty in walking, not elsewhere classified: Secondary | ICD-10-CM | POA: Diagnosis not present

## 2019-04-22 DIAGNOSIS — Z9181 History of falling: Secondary | ICD-10-CM | POA: Diagnosis not present

## 2019-04-22 DIAGNOSIS — R2689 Other abnormalities of gait and mobility: Secondary | ICD-10-CM | POA: Diagnosis not present

## 2019-04-30 DIAGNOSIS — R2689 Other abnormalities of gait and mobility: Secondary | ICD-10-CM | POA: Diagnosis not present

## 2019-04-30 DIAGNOSIS — R296 Repeated falls: Secondary | ICD-10-CM | POA: Diagnosis not present

## 2019-04-30 DIAGNOSIS — Z9181 History of falling: Secondary | ICD-10-CM | POA: Diagnosis not present

## 2019-04-30 DIAGNOSIS — R531 Weakness: Secondary | ICD-10-CM | POA: Diagnosis not present

## 2019-04-30 DIAGNOSIS — R262 Difficulty in walking, not elsewhere classified: Secondary | ICD-10-CM | POA: Diagnosis not present

## 2019-05-02 DIAGNOSIS — R531 Weakness: Secondary | ICD-10-CM | POA: Diagnosis not present

## 2019-05-02 DIAGNOSIS — R296 Repeated falls: Secondary | ICD-10-CM | POA: Diagnosis not present

## 2019-05-02 DIAGNOSIS — Z9181 History of falling: Secondary | ICD-10-CM | POA: Diagnosis not present

## 2019-05-02 DIAGNOSIS — R262 Difficulty in walking, not elsewhere classified: Secondary | ICD-10-CM | POA: Diagnosis not present

## 2019-05-02 DIAGNOSIS — R2689 Other abnormalities of gait and mobility: Secondary | ICD-10-CM | POA: Diagnosis not present

## 2019-05-07 DIAGNOSIS — R531 Weakness: Secondary | ICD-10-CM | POA: Diagnosis not present

## 2019-05-07 DIAGNOSIS — R2689 Other abnormalities of gait and mobility: Secondary | ICD-10-CM | POA: Diagnosis not present

## 2019-05-07 DIAGNOSIS — R296 Repeated falls: Secondary | ICD-10-CM | POA: Diagnosis not present

## 2019-05-07 DIAGNOSIS — Z9181 History of falling: Secondary | ICD-10-CM | POA: Diagnosis not present

## 2019-05-07 DIAGNOSIS — R262 Difficulty in walking, not elsewhere classified: Secondary | ICD-10-CM | POA: Diagnosis not present

## 2019-05-09 DIAGNOSIS — R262 Difficulty in walking, not elsewhere classified: Secondary | ICD-10-CM | POA: Diagnosis not present

## 2019-05-09 DIAGNOSIS — R531 Weakness: Secondary | ICD-10-CM | POA: Diagnosis not present

## 2019-05-09 DIAGNOSIS — Z9181 History of falling: Secondary | ICD-10-CM | POA: Diagnosis not present

## 2019-05-09 DIAGNOSIS — R2689 Other abnormalities of gait and mobility: Secondary | ICD-10-CM | POA: Diagnosis not present

## 2019-05-09 DIAGNOSIS — R296 Repeated falls: Secondary | ICD-10-CM | POA: Diagnosis not present

## 2019-05-13 DIAGNOSIS — Z9181 History of falling: Secondary | ICD-10-CM | POA: Diagnosis not present

## 2019-05-13 DIAGNOSIS — R296 Repeated falls: Secondary | ICD-10-CM | POA: Diagnosis not present

## 2019-05-13 DIAGNOSIS — R262 Difficulty in walking, not elsewhere classified: Secondary | ICD-10-CM | POA: Diagnosis not present

## 2019-05-13 DIAGNOSIS — R531 Weakness: Secondary | ICD-10-CM | POA: Diagnosis not present

## 2019-05-13 DIAGNOSIS — R2689 Other abnormalities of gait and mobility: Secondary | ICD-10-CM | POA: Diagnosis not present

## 2019-05-14 DIAGNOSIS — E119 Type 2 diabetes mellitus without complications: Secondary | ICD-10-CM | POA: Diagnosis not present

## 2019-05-14 DIAGNOSIS — H43821 Vitreomacular adhesion, right eye: Secondary | ICD-10-CM | POA: Diagnosis not present

## 2019-05-15 DIAGNOSIS — R531 Weakness: Secondary | ICD-10-CM | POA: Diagnosis not present

## 2019-05-15 DIAGNOSIS — R2689 Other abnormalities of gait and mobility: Secondary | ICD-10-CM | POA: Diagnosis not present

## 2019-05-15 DIAGNOSIS — R296 Repeated falls: Secondary | ICD-10-CM | POA: Diagnosis not present

## 2019-05-15 DIAGNOSIS — R262 Difficulty in walking, not elsewhere classified: Secondary | ICD-10-CM | POA: Diagnosis not present

## 2019-05-15 DIAGNOSIS — Z9181 History of falling: Secondary | ICD-10-CM | POA: Diagnosis not present

## 2019-05-21 DIAGNOSIS — R262 Difficulty in walking, not elsewhere classified: Secondary | ICD-10-CM | POA: Diagnosis not present

## 2019-05-21 DIAGNOSIS — R531 Weakness: Secondary | ICD-10-CM | POA: Diagnosis not present

## 2019-05-21 DIAGNOSIS — R2689 Other abnormalities of gait and mobility: Secondary | ICD-10-CM | POA: Diagnosis not present

## 2019-05-21 DIAGNOSIS — R296 Repeated falls: Secondary | ICD-10-CM | POA: Diagnosis not present

## 2019-05-21 DIAGNOSIS — Z9181 History of falling: Secondary | ICD-10-CM | POA: Diagnosis not present

## 2019-05-23 DIAGNOSIS — R2689 Other abnormalities of gait and mobility: Secondary | ICD-10-CM | POA: Diagnosis not present

## 2019-05-23 DIAGNOSIS — R531 Weakness: Secondary | ICD-10-CM | POA: Diagnosis not present

## 2019-05-23 DIAGNOSIS — Z9181 History of falling: Secondary | ICD-10-CM | POA: Diagnosis not present

## 2019-05-23 DIAGNOSIS — R296 Repeated falls: Secondary | ICD-10-CM | POA: Diagnosis not present

## 2019-05-23 DIAGNOSIS — R262 Difficulty in walking, not elsewhere classified: Secondary | ICD-10-CM | POA: Diagnosis not present

## 2019-05-27 DIAGNOSIS — Z9181 History of falling: Secondary | ICD-10-CM | POA: Diagnosis not present

## 2019-05-27 DIAGNOSIS — R262 Difficulty in walking, not elsewhere classified: Secondary | ICD-10-CM | POA: Diagnosis not present

## 2019-05-27 DIAGNOSIS — R296 Repeated falls: Secondary | ICD-10-CM | POA: Diagnosis not present

## 2019-05-27 DIAGNOSIS — R531 Weakness: Secondary | ICD-10-CM | POA: Diagnosis not present

## 2019-05-27 DIAGNOSIS — R2689 Other abnormalities of gait and mobility: Secondary | ICD-10-CM | POA: Diagnosis not present

## 2019-05-29 DIAGNOSIS — Z9181 History of falling: Secondary | ICD-10-CM | POA: Diagnosis not present

## 2019-05-29 DIAGNOSIS — R2689 Other abnormalities of gait and mobility: Secondary | ICD-10-CM | POA: Diagnosis not present

## 2019-05-29 DIAGNOSIS — R531 Weakness: Secondary | ICD-10-CM | POA: Diagnosis not present

## 2019-05-29 DIAGNOSIS — R296 Repeated falls: Secondary | ICD-10-CM | POA: Diagnosis not present

## 2019-05-29 DIAGNOSIS — R262 Difficulty in walking, not elsewhere classified: Secondary | ICD-10-CM | POA: Diagnosis not present

## 2019-05-31 DIAGNOSIS — I10 Essential (primary) hypertension: Secondary | ICD-10-CM | POA: Diagnosis not present

## 2019-05-31 DIAGNOSIS — R350 Frequency of micturition: Secondary | ICD-10-CM | POA: Diagnosis not present

## 2019-05-31 DIAGNOSIS — E782 Mixed hyperlipidemia: Secondary | ICD-10-CM | POA: Diagnosis not present

## 2019-05-31 DIAGNOSIS — B0229 Other postherpetic nervous system involvement: Secondary | ICD-10-CM | POA: Diagnosis not present

## 2019-06-05 ENCOUNTER — Telehealth (INDEPENDENT_AMBULATORY_CARE_PROVIDER_SITE_OTHER): Payer: Self-pay | Admitting: "Endocrinology

## 2019-06-05 NOTE — Telephone Encounter (Signed)
°  Who's calling (name and relationship to patient) : [name] (daughter)  Best contact number: [phone]  Provider they see: Tobe Sos  Reason for call: Patient's daughter called for a medication refill     PRESCRIPTION REFILL ONLY  Name of prescription: Vitamin D 50000 unit caps   Pharmacy: [facility] - mail order

## 2019-06-06 ENCOUNTER — Other Ambulatory Visit (INDEPENDENT_AMBULATORY_CARE_PROVIDER_SITE_OTHER): Payer: Self-pay | Admitting: *Deleted

## 2019-06-06 DIAGNOSIS — E559 Vitamin D deficiency, unspecified: Secondary | ICD-10-CM

## 2019-06-06 MED ORDER — VITAMIN D (ERGOCALCIFEROL) 1.25 MG (50000 UNIT) PO CAPS: 50000.0000 [IU] | ORAL_CAPSULE | ORAL | 3 refills | Status: DC

## 2019-06-06 NOTE — Telephone Encounter (Signed)
Sent refill to pharmacy as requested.

## 2019-06-07 DIAGNOSIS — C8319 Mantle cell lymphoma, extranodal and solid organ sites: Secondary | ICD-10-CM | POA: Diagnosis not present

## 2019-06-07 DIAGNOSIS — R970 Elevated carcinoembryonic antigen [CEA]: Secondary | ICD-10-CM | POA: Diagnosis not present

## 2019-06-10 DIAGNOSIS — Z5189 Encounter for other specified aftercare: Secondary | ICD-10-CM | POA: Diagnosis not present

## 2019-06-10 DIAGNOSIS — Z85038 Personal history of other malignant neoplasm of large intestine: Secondary | ICD-10-CM | POA: Diagnosis not present

## 2019-06-10 DIAGNOSIS — I82409 Acute embolism and thrombosis of unspecified deep veins of unspecified lower extremity: Secondary | ICD-10-CM | POA: Diagnosis not present

## 2019-06-10 DIAGNOSIS — Z8579 Personal history of other malignant neoplasms of lymphoid, hematopoietic and related tissues: Secondary | ICD-10-CM | POA: Diagnosis not present

## 2019-06-10 DIAGNOSIS — C8313 Mantle cell lymphoma, intra-abdominal lymph nodes: Secondary | ICD-10-CM | POA: Diagnosis not present

## 2019-07-10 ENCOUNTER — Telehealth (INDEPENDENT_AMBULATORY_CARE_PROVIDER_SITE_OTHER): Payer: Self-pay | Admitting: Radiology

## 2019-07-10 NOTE — Telephone Encounter (Signed)
  Who's calling (name and relationship to patient) : Sherryll Burger - Daughter   Best contact number: [phone]  Provider they see: Dr Tobe Sos   Reason for call: Daughter called to request that we send Honestie's lab orders to check her thyroid over to Dr. Sallye Ober office. She will be seeing him on 10/30 and can get all of her labs done at once. Please fax order if possible to fax # [phone]   PRESCRIPTION REFILL ONLY  Name of prescription:  Pharmacy:

## 2019-07-10 NOTE — Telephone Encounter (Signed)
Routed to provider.  What labs do you want?

## 2019-07-16 ENCOUNTER — Ambulatory Visit (INDEPENDENT_AMBULATORY_CARE_PROVIDER_SITE_OTHER): Payer: Medicare PPO | Admitting: "Endocrinology

## 2019-07-24 ENCOUNTER — Other Ambulatory Visit: Payer: Self-pay

## 2019-07-24 ENCOUNTER — Ambulatory Visit (INDEPENDENT_AMBULATORY_CARE_PROVIDER_SITE_OTHER): Payer: Medicare PPO | Admitting: "Endocrinology

## 2019-07-24 ENCOUNTER — Encounter (INDEPENDENT_AMBULATORY_CARE_PROVIDER_SITE_OTHER): Payer: Self-pay | Admitting: "Endocrinology

## 2019-07-24 DIAGNOSIS — R413 Other amnesia: Secondary | ICD-10-CM | POA: Diagnosis not present

## 2019-07-24 DIAGNOSIS — R634 Abnormal weight loss: Secondary | ICD-10-CM

## 2019-07-24 DIAGNOSIS — E049 Nontoxic goiter, unspecified: Secondary | ICD-10-CM | POA: Diagnosis not present

## 2019-07-24 DIAGNOSIS — E559 Vitamin D deficiency, unspecified: Secondary | ICD-10-CM | POA: Diagnosis not present

## 2019-07-24 DIAGNOSIS — R1013 Epigastric pain: Secondary | ICD-10-CM | POA: Diagnosis not present

## 2019-07-24 DIAGNOSIS — N2581 Secondary hyperparathyroidism of renal origin: Secondary | ICD-10-CM | POA: Diagnosis not present

## 2019-07-24 NOTE — Patient Instructions (Signed)
Follow up visit in 3 months. 

## 2019-07-24 NOTE — Progress Notes (Signed)
Subjective:  Patient Name: Joy Daniels Date of Birth: 1941-02-08  MRN: [id number]  Joy Daniels  presents at today's clinic visit for follow-up of her secondary hyperparathyroidism, hypocalcemia, vitamin D deficiency, iron deficiency anemia, dysgeusia, dyspepsia, intermittent abnormal TFTs/hypothyroidism, thyroiditis, nontoxic multinodular goiter, s/p colon cancer, s/p mantle cell lymphoma, and mild dementia.  HISTORY OF PRESENT ILLNESS:   Joy Daniels is a 78 y.o. Caucasian woman.  Joy Daniels was accompanied by her daughter, Joy Daniels.   1. Joy Daniels was first referred to me on 02/01/05 by her primary care physician, Dr. Micheal Likens, for follow up of toxic nodular goiter. The patient was then 78 years of age.   A.  The patient had been previously evaluated by Dr. Genelle Bal, an adult endocrinologist based in [place]. Dr. London Pepper was performing an outreach clinic at the [facility] in [place], [place]. He evaluated the patient for hyperthyroidism and found that she had a toxic nodular goiter. He arranged for her to have radioactive iodine treatment on 12/13/04 with 24.5 mCi of I-131. When Dr. London Pepper retired, I was asked to handle the outreach clinic for the next several months. At the patient's first visit with me, many of her hyperthyroid symptoms and signs had significantly improved. Past medical history was positive for hypertension, osteoporosis, hip pains, dyspepsia, and GERD. She had a previous injury to her right hand that required surgical repair. Family history was positive for cancer in one sister and a heart attack in another sister. Her brother had diabetes and had a stroke. On physical examination she was hypertensive. She had a 25+ gram goiter. She also had pallor of the nailbeds of her fingers.   B. At her next follow up visit in [place] on 03/29/05, she felt much better. Her energy was much improved. Muscle strength was improving. She was still significantly bothered by her excessive belly hunger that  was causing weight gain. TFTs from 03/22/05 were normal, with a TSH of 1.11, a free T4 of 1.22, and a free T3 of 2.8. I started her on Nexium 40 mg per day. Since I was getting ready to terminate the outreach clinic in [place], I made arrangements for her to be followed at my clinic in [place].   2. During the last 14 years that I have followed her here in [place], we have dealt with several different problems:  A. I followed two aspects of her multinodular goiter, the nodules themselves and her thyroid hormone status.   1. A thyroid ultrasound performed on 10/26/06 showed a 1.5 cm dominant hyperechoic mass in the left upper pole. There were 2 smaller nodules in the right lobe measuring 6.9 and 7.6 mm in diameter. On 12/14/06 a fine needle aspirate was performed on the dominant nodule in the left upper lobe. The pathologists diagnosis was: "Rare benign follicular cells, abundant histiocytes and colloid. No neoplasms identified." A follow up thyroid ultrasound performed on 11/05/07 showed an echogenic nodule in the mid left lobe measuring 1.7 x 1.1 x 0.8 cm. This was not grossly changed from images obtained during the previous fine needle aspirate. There was also a smaller nodule in the left lobe measuring 1.3 x 0.6 cm.  In the inferior aspect of the right lobe there was a nodule measuring 1.1 x 0.7 x 0.5. Her next thyroid ultrasound performed on 01/14/2009 showed a partially calcified nodule in the lower pole of the right lobe measuring 1.8 x 0.6 x 0.8. By report, this nodule had increased somewhat in size. However, because the images from  the 2009 study were not available, no direct comparison could be made. The largest nodule in the left lobe measured 1.6 x 1.0 x 0.8., essentially unchanged from the previous study. Since her clinical exam has been stable since 2010 I did not repeat her thyroid US until 01/25/18. As shown below, her two nodules that were >1 cm in diameter had shrunk down to <1 cm in diameter.  None of her nodules look suspicious or met criteria for biopsy.    2. The patient remained euthyroid through the spring of 2010. In September of 2010 and again in April of 2011 she became mildly hypothyroid. It appeared that she was having some flare ups of Hashimoto's disease at those times. Her TFTs subsequently normalized on 11/26/10.  B. Because of her known osteoporosis, I wanted to treat her with a bisphosphonate. Since she had previously not tolerated Actonel well, she was reluctant. I finally convinced her to try Boniva, but she later discontinued that medication as well. She was not at all interested in doing Forteo injections. To further try to minimize bone loss, I monitored her bone mineral parameters closely. Up through her visit on 04/17/07 her parameters were normal, but she had many difficulties in taking all of her calcium and vitamin D pills, in part due to constipation. Unfortunately, by 12/03/07 her 25-hydroxy vitamin D dropped to 24.2 and her 1,25- dihydroxy vitamin D dropped to 23.3. It appeared not only was she deficient in vitamin D intake, but her kidneys were also not optimally converting 25-hydroxy vitamin D to 1, 25-dihydroxy hydroxy vitamin D. I prescribed vitamin D, 50,000 IU per week. I also added calcitriol, 0.25 mcg per week. When she took these medications, her bone parameters normalized. In early 2010, however, she had stopped taking these medications. Her PTH rose to 75 and then to 81.8. When she again took her calcium and her two vitamin D preparations, her PTH decreased progressively to 62.1 and then to 33 on 11/26/10. On that date, her serum calcium was 10.2, her 25-hydroxy vitamin D was 32.2, and her 1, 25- dihydroxy vitamin D was 28.2. By then, however, she had stopped taking the vitamin D 50,000 international units weekly because her insurance company would not cover it. Her calcium levels, vitamin D levels, and PTH levels have fluctuated ever since, usually varying with her  intakes of calcium, vitamin D, and calcitriol. Due to concerns about hypercalcemia, her new PCP, Dr. Reesa Chew, stopped her calcitriol and vitamin D. I later had her re-start the vitamin D.  C. 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  D. In late 2014 she developed cancer of the ascending colon and was successfully treated with surgery.    3. The patient's last [facility] visit was on 04/11/19. At that visit we held her calcitriol and calcium.    A. In the interim, she had been fairly healthy while she was taking PT, but after PT stopped at the end of August she has been much less active. She has also been more unsteady. Both Joy Daniels and Joy Daniels say that Joy Daniels has all her marbles. Joy Daniels joked that she carries them around in her pocket. except for shingles in the upper right back in March. She still has some discomfort in her upper right back  where she had shingles in March.   B. She has not fallen for three months.   C. She has developed some memory problems over time.  Her distant memory is intact. Her personality, social skills, and intelligence are so good, however, that she appears to be quite normal. Joy Daniels feels that she forgets some things, but not others.  Joy Daniels feels that mom's recent memory is usually pretty good. Joy Daniels sense of time is not so good. She often forgets doctors' appointments and taking medications.  Joy Daniels is trying to help her mother do what she needs to do, but not always successfully. Mom still sometimes gets angry with Joy Daniels.   E. Joy Daniels continues to have urinary incontinence at times, but not as bad. She is now taking Topiaz which helps her sensation of bladder fullness. Joy Daniels thinks that the Joy Daniels is also causing edema. Joy.  Daniels admits that she is still taking in a lot of salt.   F. She shows no signs of recurrence of her mantle cell lymphoma or her colon cancer. She had her last follow up in September.    G. Her energy is not as good. However, she also had not been sleeping during the day as much. She sleeps better during the night when she takes gabapentin. She stays cold all the time. She is not walking much.    H. She has not had many attacks of shortness of breath recently. She says that her COPD is worse when she lays down. She still becomes somewhat short of breath if she is too active. She has not needed to use either her inhaler or her nebulizer.    I. 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 She no longer uses a walker when she walks outside the house, for example, to go shopping.    J. She has a new PCP, Dr. Reesa Chew, who discontinued her calcitriol, 0.25 mcg, twice weekly; Citracal with D twice daily, metoprolol, HCTZ, Montelukast, and vitamin D, 50,000 IU weekly; and B12 prior to her last visit. She still takes omeprazole 40, once daily; KCl ER 10 mEq, twice daily; losartan 25 mg/day; ezetimibe, 10 mg/day; and vitamin B6 daily.   She has  resumed vitamin D, 50,000 IU per week.   K. Food tastes a little bit better. Joy Daniels says that her mother's appetite varies, with some days more than others. She has been eating more junk food. Joy Daniels thinks that her appetite has improved since being on B6.   L. Joy Daniels says that her hair is growing out more, but is harder to manage.   4. Pertinent Review of Systems:  Constitutional: Joy. Ost has a cane and a walker that she uses when she goes out, but not often at home.   Eyes: Vision is better since having bilateral cataractectomies, but the vision is not as good in her right eye. She had an eye appointment in September 2020. No changes were made in her glasses. She does not wear the glasses often. The  ophthalmologist recommended eye drops, but she doesn't use them.  Mouth: Mouth is often dry.    Neck: She has not had any thyroid pains or inflammation. She occasionally still has left postero-lateral neck pains, as she has had off and on for years. Her range of motion of the cervical spine is much reduced. She no longer has difficulty swallowing unless her mouth gets too dry.    Heart:  She is not aware of any palpitations, chest pain, or chest pressure.  Lungs: Her COPD is probably unchanged. She still gets short of breath with minor exertion.  Gastrointestinal: She is constipated sometimes. She no longer takes Miralax and Benefiber proactively, but only takes these fiber supplements now when she feels constipated. She has no complaints of excessive hunger, acid reflux, upset stomach, stomach aches or pains. Legs: She has not had many calf cramps at night since her last visit.  Muscle mass and strength seem normal. There are no complaints of numbness, tingling, or burning. She has had more edema. She no longer takes HCTZ.  Feet: There are no obvious foot problems. She has occasional edema.  Neuro: Her balance is still not very good.    PAST MEDICAL, FAMILY, AND SOCIAL HISTORY:  Past Medical History:  Diagnosis Date  . Combined hyperlipidemia   . COPD (chronic obstructive pulmonary disease) ([place])   . Dysgeusia   . GERD (gastroesophageal reflux disease)   . Hypercalcemia   . Hyperparathyroidism , secondary, non-renal ([place])   . Hypertension   . Hypothyroidism, acquired, autoimmune   . Iron deficiency   . Lymphoma, mantle cell, multiple sites ([place])   . Nontoxic multinodular goiter   . Obesity (BMI 30.0-34.9)   . Other osteoporosis   . Pallor   . Thyroiditis, autoimmune   . Toxic nodular goiter   . Vitamin D deficiency disease     Family History  Problem Relation Age of Onset  . Diabetes Father   . Cancer Sister   . Heart disease Sister      Current Outpatient Medications:  .   aspirin 81 MG chewable tablet, Chew by mouth daily., Disp: , Rfl:  .  ezetimibe (ZETIA) 10 MG tablet, Take 10 mg by mouth daily., Disp: , Rfl:  .  fesoterodine (TOVIAZ) 4 MG TB24 tablet, Take 4 mg by mouth daily., Disp: , Rfl:  .  gabapentin (NEURONTIN) 300 MG capsule, Take 300 mg by mouth 2 (two) times daily., Disp: , Rfl:  .  loratadine (CLARITIN) 10 MG tablet, Take 10 mg by mouth daily., Disp: , Rfl:  .  metoprolol tartrate (LOPRESSOR) 50 MG tablet, Take 50 mg by mouth daily., Disp: , Rfl: 0 .  omeprazole (PRILOSEC) 10 MG capsule, Take 10 mg by mouth daily.  , Disp: , Rfl:  .  potassium chloride (K-DUR,KLOR-CON) 10 MEQ tablet, , Disp: , Rfl:  .  pyridoxine (B-6) 100 MG tablet, Take 100 mg by mouth daily., Disp: , Rfl:  .  Vitamin D, Ergocalciferol, (DRISDOL) 1.25 MG (50000 UT) CAPS capsule, Take 1 capsule (50,000 Units total) by mouth every 7 (seven) days., Disp: 4 capsule, Rfl: 3 .  calcitRIOL (ROCALTROL) 0.25 MCG capsule, TAKE 1 CAPSULE TWICE WEEKLY (Patient not taking: Reported on 01/08/2019), Disp: 25 capsule, Rfl: 3  Allergies as of 07/24/2019 - Review Complete 07/24/2019  Allergen Reaction Noted  . Codeine  01/27/2011  . Latex  07/10/2018    1. Work and Family: Her family is supportive. Shereena lives with her husband, daughter Joy Daniels, and three adult grandsons.    2. Activities: She is trying to walk in the house. 3. Smoking, alcohol, or drugs: None 4. Primary Care Provider: Dr. Reesa Chew, [facility], phone [phone], fax [phone]  REVIEW OF SYSTEMS: There are no other significant problems involving Joy Daniels's other body systems.   Objective:  Vital Signs:  BP 136/90   Pulse 84   Wt 144 lb 9.6 oz (65.6  kg)   BMI 28.24 kg/m   She did not take her medication today.    Ht Readings from Last 3 Encounters:  07/10/18 5' (1.524 m)  01/08/18 5' (1.524 m)  10/12/10 5' (1.524 m)   Wt Readings from Last 3 Encounters:  07/24/19 144 lb 9.6 oz (65.6 kg)  04/11/19 142 lb  12.8 oz (64.8 kg)  07/10/18 156 lb (70.8 kg)   PHYSICAL EXAM:  Joy Daniels looks pretty good overall today. She is alert and bright. Her sense of humor is good. Her dementia is very mild and subtle today. She has regained 2 pounds since her July visit .  Eyes: There is no arcus or proptosis.  Mouth: The oropharynx appears normal. The tongue appears normal. There is normal oral moisture. There is no obvious gingivitis. Neck: There are no bruits present. The thyroid gland appears normal in size. The thyroid gland is normal at approximately 18 grams in size. The consistency of the thyroid gland is normal. There is no thyroid tenderness to palpation. Lungs: The lungs are clear. Air movement is good. Heart: The heart rhythm and rate appear normal. Heart sounds S1 and S2 are normal. I do not appreciate any pathologic heart murmurs. Abdomen: The abdominal size is enlarged. Bowel sounds are normal. The abdomen is soft and non-tender. There is no obviously palpable hepatomegaly, splenomegaly, or other masses.  Arms: Muscle mass appears appropriate for age. Hands: There is a 1+ hand tremor. Phalangeal and metacarpophalangeal joints appear normal. Palms are normal. Legs: Muscle mass appears appropriate for age. There is trace edema.  Neurologic: Muscle strength is normal for age and gender in both the upper and the lower extremities. Muscle tone appears normal. Sensation to touch is normal in the legs.  LAB DATA:   Labs 12/28/18: PTH 66 (ref 15-65), calcium 11.3 (ref 8.7-10.3), 25-OH vitamin D 39.6 (ref 30-100); fasting glucose 100; creatinine 0.94, CO2 30 (ref 20-29); uric acid 6.4 (ref 2.5-7.1)  Labs 07/10/18: PTH 120 (ref 14-64), calcium 10.8 (ref 8.6-10.4), 25-OH vitamin D 51  Labs 02/23/18: PTH 65, calcium 10.9, 1,25 vitamin D 45 (ref 18-72)   Labs 01/08/18: TSH 2.08, free T4 1.1, free T3 2.2; PTH 68, calcium 10.9, 25-OH vitamin D 37  Labs 07/10/17: TSH 1.96, free T4 1.0, free T3 2.6; CMP normal, except  calcium 10.5; 25-OH vitamin D 46; PTH 74 (14-64), vitamin B12 403 (ref 200-1,100), vitamin B6 18.9 (ref 2.1-21.7)  Labs 01/05/17: PTH 71 (ref 14-64), calcium 10.3, 25-OH vitamin D 58, 1,25-dihydroxy vitamin D 56 (ref 18-72)  Labs 10/04/16: Surgical pathology: Peripheral blood flow cytometry: Relative larger amount of T cells than B cells and reversal of the CD4:CD8 ratio  Labs 06/24/16: TSH 2.36, free T4 0,5, free T3 2.5; CMP normal, except for bilirubin 1.6 and ALT 40; 25-OH vitamin D 42  Labs 12/22/15: TSH 2.66, free T4 1.2, free T3 2.4; CMP normal except for AST 39 and ALT 48; PTH 79 (ref 14-64), calcium 10.0,  25-OH vitamin D 47, 1,25-dihydroxy vitamin d 40 (ref 18.72)  Labs: 11/18/14: 25-OH vitamin D 32.9, calcium 10.1; ALT 42; cholesterol 237, triglycerides 290, HDL 46, LDL 133  Labs 08/14/14: 25-OH vitamin D 31.9, calcium 10.6; ALT 33; TSH 3.47   Labs 11/05/13: TSH 2.902, free T4 1.06, free T3 2.5; 1,25-dihydroxy vitamin D 39; 25-hydroxy vitamin D 47, PTH 72, calcium 10.1  Labs 06/11/13: 1,25-dihydroxy vitamin D 39, 25-hydroxy vitamin D 47, PTH 72, calcium 10.1 ; TSH 2.901, free T4 1.06, free T3  2.5  Labs 12/10/12: Hgb 11.1, Hct 33.6%; TSH 2.964, free T4 1.06, free T3 2.4; 25-hydroxy vitamin D 37, PTH 89.4, calcium 9.9; CMP normal  Labs 06/11/12: CMP was normal, to include a calcium of 9.9; TSH 2.195,  free T4 0.98, free T3 2.6; PTH 102.3, 25-hydroxy vitamin D 29, 1,25-dihydroxy vitamin D 57.  Labs 11/17/11: PTH 70.9,calcium 10.0, 25-hydroxy vitamin D 40, 1,25-dihydroxy vitamin D 67, TSH 2.312. Free T4  0.97, Free T3 2.4  Labs 05/19/11: PTH 64, calcium 10.4, 25-hydroxy vitamin D 36, 1,25-dihydroxy vitamin D 47, TSH 2.497, free T4 1.04, free T3 2.6  IMAGING:  Thyroid US 01/25/18: 0.7 cm peripheral calcified nodule, right inferior ; 0.8 cm inferior right nodule with coarse calcifications, previously 1.8 cm, no progression for greater than 5 years; 0.7 cm peripherally calcified cyst in superior  left lobe, stable for greater than 5 years; 0.8 cm hyperechoic nodules in mid left lobe, previously 1.6 cm, stable for greater than 5 years. No nodule met criteria for biopsy.    Assessment and Plan:   ASSESSMENT:  1-3. Obesity/unintentional weight loss/dysgeusia:   A. She has recently re-gained some weight.   Joy Daniels feels that her appetite has improved since beginning vitamin B6 therapy. Her tongue looks normal, so she does not have any obvious glossitis.  4. Vitamin D deficiency:   A. The patient's 25-hydroxy vitamin D levels in April 2017, October 2017, April 2018, October 2018, April 2019, October 2019, and April 2020 were normal.  B. Her 1,25-dihydroxy vitamin D (calcitriol) levels in April 2017, April 2018, and June 2019 were also normal while taking calcitriol.   C. The combination of oral vitamin D and oral calcitriol that she was taking in the past had worked for her.  She had previously been taken off all vitamin D and calcium due to having a more elevated calcium level, but later resumed weekly vitamin D. She was told recently that her recent lab tests were good.  5-6. Thyroiditis/multinodular goiter:   A. Her Hashimoto's disease was clinically quiescent at her last 2 visits and is quiescent today. Her thyroid gland was again within normal limits for size at her last visit and again today. The episodic "tenderness" that she has had previously and the process of waxing and waning of thyroid gland size are c/w thyroiditis.    B. She was euthyroid again in April and October 2017, in October 2018, and in April 2019.   C. Her thyroid US in May 2019 showed several unremarkable small nodules.  7. Dyspepsia: She is doing better on omeprazole. 8-9. Hyperparathyroidism, secondary to calcium and vitamin D intake deficiency/hypercalcemia:   A. Her PTH had been mildly elevated for several years, but her calciums had been in the upper quartile of the normal range or slightly elevated.   B. Her  calcium in October 2018 was at the upper end of the normal range. Her calcium levels in April, June, and October 2019 were mildly elevated. Her PTH in October was very high. It appeared at that time that she might be slowly converting from secondary hyperparathyroidism to tertiary hyperparathyroidism.   C. In April 2020 her PTH decreased to just above the upper limit of normal, but her calcium was much higher. These findings suggest that her parathyroid glands will respond to her ambient calcium level, although imperfectly.   D. While I understood why her PCP stopped the calcium, vitamin D, and calcitriol medications, I suspect that these changes will cause her secondary/tertiary hyperparathyroidism  to worsen.  If so, it will be important to keep her calcium levels relatively high and her vitamin D levels normal in order to control the PTH. We need to find a calcium/vitamin D/calcitriol regimen that controls the PTH without causing too high a calcium level. If we and she are lucky, the calcium levels will not increase too much and she will not need parathyroidectomy.   E. Depending upon what her recent lab results are, we may need to perform additional lab studies and perhaps a new thyroid US looking for a parathyroid adenoma or a sestamibi scan.   10. Fatigue, other: Patient's fatigue is still present, which I suspect is due in part to her chemotherapy. She is also very deconditioned. She needs to walk more.   11. Lymphoma: Her lymphoma is in remission, which is wonderful.  12. Colon cancer: Her colon cancer is also in remission, which is also wonderful.  13. Calf cramps and restless legs:  These problems have resolved. I again  recommended that she stretch the calves before going to bed.  14. Memory problem: She appears to be doing pretty well overall, but needs ongoing support from Joy Daniels.   15. Combined hyperlipidemia: Dr. Reesa Chew is following this problem for her.  16. Pallor: She will need a CBC and CMP  when she sees her oncologist. 17. Hair loss: Her scalp is somewhat thin, but there has been any real change at her last several visits. I have talked with her about Rogaine for Women in the past, but she declined. She says today that her hair is unchanged. Joy Daniels concurred.    PLAN:  1. Diagnostic: Repeat TFTs, calcium, PTH, 25-OH vitamin D, and the calcitriol level now. If needed, repeat US of the thyroid gland to look for a parathyroid adenoma. We many also need a sestamibi scan.   2. Therapeutic: Continue to hold calcitriol and calcium to see if the calcium level decreases without causing too much of an increase in PTH. Continue vitamin B12 and continue vitamin D for now. Continue to take a multivitamin pill and one vitamin B6 capsule daily 3. 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 We need to keep up her intake of calcium and vitamin D to the extent that we can, but we also need to control the PTH as well as we can. Unfortunately, if her PTH function worsens she might need parathyroid surgery. 4. Follow-up: 3 months  Level of Service: This visit lasted in excess of 70 minutes. More than 50% of the visit was devoted to counseling.  Tillman Sers, MD, CDE Adult and Pediatric Endocrinology 07/24/2019 11:49 AM

## 2019-07-27 LAB — PTH, INTACT AND CALCIUM
Calcium: 10.7 mg/dL — ABNORMAL HIGH (ref 8.6–10.4)
PTH: 82 pg/mL — ABNORMAL HIGH (ref 14–64)

## 2019-07-27 LAB — TSH: TSH: 3.89 mIU/L (ref 0.40–4.50)

## 2019-07-27 LAB — VITAMIN D 1,25 DIHYDROXY
Vitamin D 1, 25 (OH)2 Total: 43 pg/mL (ref 18–72)
Vitamin D2 1, 25 (OH)2: 33 pg/mL
Vitamin D3 1, 25 (OH)2: 10 pg/mL

## 2019-07-27 LAB — T4, FREE: Free T4: 1.1 ng/dL (ref 0.8–1.8)

## 2019-07-27 LAB — VITAMIN D 25 HYDROXY (VIT D DEFICIENCY, FRACTURES): Vit D, 25-Hydroxy: 55 ng/mL (ref 30–100)

## 2019-07-27 LAB — T3, FREE: T3, Free: 2.7 pg/mL (ref 2.3–4.2)

## 2019-08-05 ENCOUNTER — Encounter (INDEPENDENT_AMBULATORY_CARE_PROVIDER_SITE_OTHER): Payer: Self-pay

## 2019-08-17 ENCOUNTER — Other Ambulatory Visit (INDEPENDENT_AMBULATORY_CARE_PROVIDER_SITE_OTHER): Payer: Self-pay | Admitting: "Endocrinology

## 2019-08-17 DIAGNOSIS — E559 Vitamin D deficiency, unspecified: Secondary | ICD-10-CM

## 2019-09-02 DIAGNOSIS — E782 Mixed hyperlipidemia: Secondary | ICD-10-CM | POA: Diagnosis not present

## 2019-09-02 DIAGNOSIS — Z0000 Encounter for general adult medical examination without abnormal findings: Secondary | ICD-10-CM | POA: Diagnosis not present

## 2019-09-02 DIAGNOSIS — I10 Essential (primary) hypertension: Secondary | ICD-10-CM | POA: Diagnosis not present

## 2019-09-02 DIAGNOSIS — Z1389 Encounter for screening for other disorder: Secondary | ICD-10-CM | POA: Diagnosis not present

## 2019-09-02 DIAGNOSIS — R2681 Unsteadiness on feet: Secondary | ICD-10-CM | POA: Diagnosis not present

## 2019-09-02 DIAGNOSIS — Z6826 Body mass index (BMI) 26.0-26.9, adult: Secondary | ICD-10-CM | POA: Diagnosis not present

## 2019-09-09 DIAGNOSIS — I714 Abdominal aortic aneurysm, without rupture: Secondary | ICD-10-CM | POA: Diagnosis not present

## 2019-09-09 DIAGNOSIS — E876 Hypokalemia: Secondary | ICD-10-CM | POA: Diagnosis not present

## 2019-09-09 DIAGNOSIS — K219 Gastro-esophageal reflux disease without esophagitis: Secondary | ICD-10-CM | POA: Diagnosis not present

## 2019-09-09 DIAGNOSIS — E785 Hyperlipidemia, unspecified: Secondary | ICD-10-CM | POA: Diagnosis not present

## 2019-09-09 DIAGNOSIS — Z8673 Personal history of transient ischemic attack (TIA), and cerebral infarction without residual deficits: Secondary | ICD-10-CM | POA: Diagnosis not present

## 2019-09-09 DIAGNOSIS — J309 Allergic rhinitis, unspecified: Secondary | ICD-10-CM | POA: Diagnosis not present

## 2019-09-09 DIAGNOSIS — Z9842 Cataract extraction status, left eye: Secondary | ICD-10-CM | POA: Diagnosis not present

## 2019-09-09 DIAGNOSIS — E669 Obesity, unspecified: Secondary | ICD-10-CM | POA: Diagnosis not present

## 2019-09-09 DIAGNOSIS — Z9841 Cataract extraction status, right eye: Secondary | ICD-10-CM | POA: Diagnosis not present

## 2019-09-09 DIAGNOSIS — H547 Unspecified visual loss: Secondary | ICD-10-CM | POA: Diagnosis not present

## 2019-09-09 DIAGNOSIS — I10 Essential (primary) hypertension: Secondary | ICD-10-CM | POA: Diagnosis not present

## 2019-09-09 DIAGNOSIS — Z973 Presence of spectacles and contact lenses: Secondary | ICD-10-CM | POA: Diagnosis not present

## 2019-09-09 DIAGNOSIS — E559 Vitamin D deficiency, unspecified: Secondary | ICD-10-CM | POA: Diagnosis not present

## 2019-09-10 DIAGNOSIS — R5381 Other malaise: Secondary | ICD-10-CM | POA: Diagnosis not present

## 2019-09-10 DIAGNOSIS — R2681 Unsteadiness on feet: Secondary | ICD-10-CM | POA: Diagnosis not present

## 2019-09-10 DIAGNOSIS — M25519 Pain in unspecified shoulder: Secondary | ICD-10-CM | POA: Diagnosis not present

## 2019-09-10 DIAGNOSIS — Z9181 History of falling: Secondary | ICD-10-CM | POA: Diagnosis not present

## 2019-09-10 DIAGNOSIS — R531 Weakness: Secondary | ICD-10-CM | POA: Diagnosis not present

## 2019-09-11 DIAGNOSIS — R531 Weakness: Secondary | ICD-10-CM | POA: Diagnosis not present

## 2019-09-11 DIAGNOSIS — R2681 Unsteadiness on feet: Secondary | ICD-10-CM | POA: Diagnosis not present

## 2019-09-11 DIAGNOSIS — Z9181 History of falling: Secondary | ICD-10-CM | POA: Diagnosis not present

## 2019-09-11 DIAGNOSIS — R5381 Other malaise: Secondary | ICD-10-CM | POA: Diagnosis not present

## 2019-09-11 DIAGNOSIS — M25519 Pain in unspecified shoulder: Secondary | ICD-10-CM | POA: Diagnosis not present

## 2019-09-17 DIAGNOSIS — M25519 Pain in unspecified shoulder: Secondary | ICD-10-CM | POA: Diagnosis not present

## 2019-09-17 DIAGNOSIS — R5381 Other malaise: Secondary | ICD-10-CM | POA: Diagnosis not present

## 2019-09-17 DIAGNOSIS — R2681 Unsteadiness on feet: Secondary | ICD-10-CM | POA: Diagnosis not present

## 2019-09-17 DIAGNOSIS — Z9181 History of falling: Secondary | ICD-10-CM | POA: Diagnosis not present

## 2019-09-17 DIAGNOSIS — R531 Weakness: Secondary | ICD-10-CM | POA: Diagnosis not present

## 2019-09-19 DIAGNOSIS — Z9181 History of falling: Secondary | ICD-10-CM | POA: Diagnosis not present

## 2019-09-19 DIAGNOSIS — R5381 Other malaise: Secondary | ICD-10-CM | POA: Diagnosis not present

## 2019-09-19 DIAGNOSIS — M25519 Pain in unspecified shoulder: Secondary | ICD-10-CM | POA: Diagnosis not present

## 2019-09-19 DIAGNOSIS — R2681 Unsteadiness on feet: Secondary | ICD-10-CM | POA: Diagnosis not present

## 2019-09-19 DIAGNOSIS — R531 Weakness: Secondary | ICD-10-CM | POA: Diagnosis not present

## 2019-09-24 DIAGNOSIS — R531 Weakness: Secondary | ICD-10-CM | POA: Diagnosis not present

## 2019-09-24 DIAGNOSIS — Z9181 History of falling: Secondary | ICD-10-CM | POA: Diagnosis not present

## 2019-09-24 DIAGNOSIS — M25519 Pain in unspecified shoulder: Secondary | ICD-10-CM | POA: Diagnosis not present

## 2019-09-24 DIAGNOSIS — R5381 Other malaise: Secondary | ICD-10-CM | POA: Diagnosis not present

## 2019-09-24 DIAGNOSIS — R2681 Unsteadiness on feet: Secondary | ICD-10-CM | POA: Diagnosis not present

## 2019-09-26 DIAGNOSIS — Z9181 History of falling: Secondary | ICD-10-CM | POA: Diagnosis not present

## 2019-09-26 DIAGNOSIS — R5381 Other malaise: Secondary | ICD-10-CM | POA: Diagnosis not present

## 2019-09-26 DIAGNOSIS — R2681 Unsteadiness on feet: Secondary | ICD-10-CM | POA: Diagnosis not present

## 2019-09-26 DIAGNOSIS — R531 Weakness: Secondary | ICD-10-CM | POA: Diagnosis not present

## 2019-09-26 DIAGNOSIS — M25519 Pain in unspecified shoulder: Secondary | ICD-10-CM | POA: Diagnosis not present

## 2019-09-30 DIAGNOSIS — M25519 Pain in unspecified shoulder: Secondary | ICD-10-CM | POA: Diagnosis not present

## 2019-09-30 DIAGNOSIS — Z9181 History of falling: Secondary | ICD-10-CM | POA: Diagnosis not present

## 2019-09-30 DIAGNOSIS — R531 Weakness: Secondary | ICD-10-CM | POA: Diagnosis not present

## 2019-09-30 DIAGNOSIS — R2681 Unsteadiness on feet: Secondary | ICD-10-CM | POA: Diagnosis not present

## 2019-09-30 DIAGNOSIS — R5381 Other malaise: Secondary | ICD-10-CM | POA: Diagnosis not present

## 2019-10-02 DIAGNOSIS — Z9181 History of falling: Secondary | ICD-10-CM | POA: Diagnosis not present

## 2019-10-02 DIAGNOSIS — R531 Weakness: Secondary | ICD-10-CM | POA: Diagnosis not present

## 2019-10-02 DIAGNOSIS — R2681 Unsteadiness on feet: Secondary | ICD-10-CM | POA: Diagnosis not present

## 2019-10-02 DIAGNOSIS — M25519 Pain in unspecified shoulder: Secondary | ICD-10-CM | POA: Diagnosis not present

## 2019-10-02 DIAGNOSIS — R5381 Other malaise: Secondary | ICD-10-CM | POA: Diagnosis not present

## 2019-10-08 DIAGNOSIS — M25519 Pain in unspecified shoulder: Secondary | ICD-10-CM | POA: Diagnosis not present

## 2019-10-08 DIAGNOSIS — Z9181 History of falling: Secondary | ICD-10-CM | POA: Diagnosis not present

## 2019-10-08 DIAGNOSIS — R5381 Other malaise: Secondary | ICD-10-CM | POA: Diagnosis not present

## 2019-10-08 DIAGNOSIS — R531 Weakness: Secondary | ICD-10-CM | POA: Diagnosis not present

## 2019-10-08 DIAGNOSIS — R2681 Unsteadiness on feet: Secondary | ICD-10-CM | POA: Diagnosis not present

## 2019-10-15 DIAGNOSIS — R531 Weakness: Secondary | ICD-10-CM | POA: Diagnosis not present

## 2019-10-15 DIAGNOSIS — R5381 Other malaise: Secondary | ICD-10-CM | POA: Diagnosis not present

## 2019-10-15 DIAGNOSIS — R2681 Unsteadiness on feet: Secondary | ICD-10-CM | POA: Diagnosis not present

## 2019-10-15 DIAGNOSIS — Z9181 History of falling: Secondary | ICD-10-CM | POA: Diagnosis not present

## 2019-10-15 DIAGNOSIS — M25519 Pain in unspecified shoulder: Secondary | ICD-10-CM | POA: Diagnosis not present

## 2019-10-24 ENCOUNTER — Ambulatory Visit (INDEPENDENT_AMBULATORY_CARE_PROVIDER_SITE_OTHER): Payer: Medicare PPO | Admitting: "Endocrinology

## 2019-11-28 DIAGNOSIS — K219 Gastro-esophageal reflux disease without esophagitis: Secondary | ICD-10-CM | POA: Diagnosis not present

## 2019-11-28 DIAGNOSIS — K5900 Constipation, unspecified: Secondary | ICD-10-CM | POA: Diagnosis not present

## 2019-12-09 DIAGNOSIS — I10 Essential (primary) hypertension: Secondary | ICD-10-CM | POA: Diagnosis not present

## 2019-12-09 DIAGNOSIS — E782 Mixed hyperlipidemia: Secondary | ICD-10-CM | POA: Diagnosis not present

## 2019-12-16 DIAGNOSIS — Z20828 Contact with and (suspected) exposure to other viral communicable diseases: Secondary | ICD-10-CM | POA: Diagnosis not present

## 2019-12-16 DIAGNOSIS — J3489 Other specified disorders of nose and nasal sinuses: Secondary | ICD-10-CM | POA: Diagnosis not present

## 2019-12-16 DIAGNOSIS — Z1159 Encounter for screening for other viral diseases: Secondary | ICD-10-CM | POA: Diagnosis not present

## 2019-12-18 ENCOUNTER — Ambulatory Visit (INDEPENDENT_AMBULATORY_CARE_PROVIDER_SITE_OTHER): Payer: Medicare PPO | Admitting: "Endocrinology

## 2019-12-19 DIAGNOSIS — Z85038 Personal history of other malignant neoplasm of large intestine: Secondary | ICD-10-CM | POA: Diagnosis not present

## 2019-12-19 DIAGNOSIS — K644 Residual hemorrhoidal skin tags: Secondary | ICD-10-CM | POA: Diagnosis not present

## 2019-12-19 DIAGNOSIS — C182 Malignant neoplasm of ascending colon: Secondary | ICD-10-CM | POA: Diagnosis not present

## 2019-12-19 DIAGNOSIS — Z800 Family history of malignant neoplasm of digestive organs: Secondary | ICD-10-CM | POA: Diagnosis not present

## 2019-12-19 DIAGNOSIS — K648 Other hemorrhoids: Secondary | ICD-10-CM | POA: Diagnosis not present

## 2019-12-19 DIAGNOSIS — Z86718 Personal history of other venous thrombosis and embolism: Secondary | ICD-10-CM | POA: Diagnosis not present

## 2019-12-19 DIAGNOSIS — Z9049 Acquired absence of other specified parts of digestive tract: Secondary | ICD-10-CM | POA: Diagnosis not present

## 2019-12-19 DIAGNOSIS — K5900 Constipation, unspecified: Secondary | ICD-10-CM | POA: Diagnosis not present

## 2019-12-19 DIAGNOSIS — Z8572 Personal history of non-Hodgkin lymphomas: Secondary | ICD-10-CM | POA: Diagnosis not present

## 2019-12-19 DIAGNOSIS — I10 Essential (primary) hypertension: Secondary | ICD-10-CM | POA: Diagnosis not present

## 2019-12-19 DIAGNOSIS — D124 Benign neoplasm of descending colon: Secondary | ICD-10-CM | POA: Diagnosis not present

## 2019-12-19 DIAGNOSIS — K635 Polyp of colon: Secondary | ICD-10-CM | POA: Diagnosis not present

## 2019-12-19 DIAGNOSIS — J45909 Unspecified asthma, uncomplicated: Secondary | ICD-10-CM | POA: Diagnosis not present

## 2019-12-19 DIAGNOSIS — K219 Gastro-esophageal reflux disease without esophagitis: Secondary | ICD-10-CM | POA: Diagnosis not present

## 2020-02-23 ENCOUNTER — Other Ambulatory Visit (INDEPENDENT_AMBULATORY_CARE_PROVIDER_SITE_OTHER): Payer: Self-pay | Admitting: "Endocrinology

## 2020-02-23 DIAGNOSIS — E559 Vitamin D deficiency, unspecified: Secondary | ICD-10-CM

## 2020-03-05 ENCOUNTER — Encounter (INDEPENDENT_AMBULATORY_CARE_PROVIDER_SITE_OTHER): Payer: Self-pay | Admitting: "Endocrinology

## 2020-03-05 ENCOUNTER — Ambulatory Visit (INDEPENDENT_AMBULATORY_CARE_PROVIDER_SITE_OTHER): Payer: Medicare PPO | Admitting: "Endocrinology

## 2020-03-05 ENCOUNTER — Other Ambulatory Visit: Payer: Self-pay

## 2020-03-05 VITALS — BP 118/68 | HR 82 | Wt 146.0 lb

## 2020-03-05 DIAGNOSIS — E063 Autoimmune thyroiditis: Secondary | ICD-10-CM | POA: Diagnosis not present

## 2020-03-05 DIAGNOSIS — R1013 Epigastric pain: Secondary | ICD-10-CM

## 2020-03-05 DIAGNOSIS — E559 Vitamin D deficiency, unspecified: Secondary | ICD-10-CM | POA: Diagnosis not present

## 2020-03-05 DIAGNOSIS — C8318 Mantle cell lymphoma, lymph nodes of multiple sites: Secondary | ICD-10-CM

## 2020-03-05 DIAGNOSIS — R634 Abnormal weight loss: Secondary | ICD-10-CM

## 2020-03-05 DIAGNOSIS — E042 Nontoxic multinodular goiter: Secondary | ICD-10-CM | POA: Diagnosis not present

## 2020-03-05 DIAGNOSIS — R413 Other amnesia: Secondary | ICD-10-CM

## 2020-03-05 DIAGNOSIS — E211 Secondary hyperparathyroidism, not elsewhere classified: Secondary | ICD-10-CM

## 2020-03-05 DIAGNOSIS — R432 Parageusia: Secondary | ICD-10-CM | POA: Diagnosis not present

## 2020-03-05 DIAGNOSIS — C189 Malignant neoplasm of colon, unspecified: Secondary | ICD-10-CM

## 2020-03-05 NOTE — Patient Instructions (Signed)
Follow up visit in 4 months.  

## 2020-03-05 NOTE — Progress Notes (Signed)
Subjective:  Patient Name: Joy Daniels Date of Birth: October 03, 1940  MRN: [id number]  Joy Daniels  presents at today's clinic visit for follow-up of her secondary hyperparathyroidism, hypocalcemia, vitamin D deficiency, iron deficiency anemia, dysgeusia, dyspepsia, intermittent abnormal TFTs/hypothyroidism, thyroiditis, nontoxic multinodular goiter, s/p colon cancer, s/p mantle cell lymphoma, and mild dementia.  HISTORY OF PRESENT ILLNESS:   Joy Daniels is a 79 y.o. Caucasian woman.  Joy Daniels was accompanied by her daughter, Joy Daniels.   1. Ms. Joy Daniels was first referred to me on 02/01/05 by her primary care physician, Dr. Micheal Likens, for follow up of toxic nodular goiter. The patient was then 79 years of age.   A.  The patient had been previously evaluated by Dr. Genelle Bal, an adult endocrinologist based in [place]. Dr. London Pepper was performing an outreach clinic at the [facility] in [place], [place]. He evaluated the patient for hyperthyroidism and found that she had a toxic nodular goiter. He arranged for her to have radioactive iodine treatment on 12/13/04 with 24.5 mCi of I-131. When Dr. London Pepper retired, I was asked to handle the outreach clinic for the next several months. At the patient's first visit with me, many of her hyperthyroid symptoms and signs had significantly improved. Past medical history was positive for hypertension, osteoporosis, hip pains, dyspepsia, and GERD. She had a previous injury to her right hand that required surgical repair. Family history was positive for cancer in one sister and a heart attack in another sister. Her brother had diabetes and had a stroke. On physical examination she was hypertensive. She had a 25+ gram goiter. She also had pallor of the nailbeds of her fingers.   B. At her next follow up visit in [place] on 03/29/05, she felt much better. Her energy was much improved. Muscle strength was improving. She was still significantly bothered by her excessive belly hunger that  was causing weight gain. TFTs from 03/22/05 were normal, with a TSH of 1.11, a free T4 of 1.22, and a free T3 of 2.8. I started her on Nexium 40 mg per day. Since I was getting ready to terminate the outreach clinic in [place], I made arrangements for her to be followed at my clinic in [place].   2. During the last 15 years that I have followed her here in [place], we have dealt with several different problems:  A. I followed two aspects of her multinodular goiter, the nodules themselves and her thyroid hormone status.   1. A thyroid ultrasound performed on 10/26/06 showed a 1.5 cm dominant hyperechoic mass in the left upper pole. There were 2 smaller nodules in the right lobe measuring 6.9 and 7.6 mm in diameter. On 12/14/06 a fine needle aspirate was performed on the dominant nodule in the left upper lobe. The pathologists diagnosis was: "Rare benign follicular cells, abundant histiocytes and colloid. No neoplasms identified." A follow up thyroid ultrasound performed on 11/05/07 showed an echogenic nodule in the mid left lobe measuring 1.7 x 1.1 x 0.8 cm. This was not grossly changed from images obtained during the previous fine needle aspirate. There was also a smaller nodule in the left lobe measuring 1.3 x 0.6 cm.  In the inferior aspect of the right lobe there was a nodule measuring 1.1 x 0.7 x 0.5. Her next thyroid ultrasound performed on 01/14/2009 showed a partially calcified nodule in the lower pole of the right lobe measuring 1.8 x 0.6 x 0.8. By report, this nodule had increased somewhat in size. However, because the images from  the 2009 study were not available, no direct comparison could be made. The largest nodule in the left lobe measured 1.6 x 1.0 x 0.8., essentially unchanged from the previous study. Since her clinical exam has been stable since 2010 I did not repeat her thyroid US until 01/25/18. As shown below, her two nodules that were >1 cm in diameter had shrunk down to <1 cm in diameter.  None of her nodules look suspicious or met criteria for biopsy.    2. The patient remained euthyroid through the spring of 2010. In September of 2010 and again in April of 2011 she became mildly hypothyroid. It appeared that she was having some flare ups of Hashimoto's disease at those times. Her TFTs subsequently normalized on 11/26/10.  B. Because of her known osteoporosis, I wanted to treat her with a bisphosphonate. Since she had previously not tolerated Actonel well, she was reluctant. I finally convinced her to try Boniva, but she later discontinued that medication as well. She was not at all interested in doing Forteo injections. To further try to minimize bone loss, I monitored her bone mineral parameters closely. Up through her visit on 04/17/07 her parameters were normal, but she had many difficulties in taking all of her calcium and vitamin D pills, in part due to constipation. Unfortunately, by 12/03/07 her 25-hydroxy vitamin D dropped to 24.2 and her 1,25- dihydroxy vitamin D dropped to 23.3. It appeared not only was she deficient in vitamin D intake, but her kidneys were also not optimally converting 25-hydroxy vitamin D to 1, 25-dihydroxy hydroxy vitamin D. I prescribed vitamin D, 50,000 IU per week. I also added calcitriol, 0.25 mcg per week. When she took these medications, her bone parameters normalized. In early 2010, however, she had stopped taking these medications. Her PTH rose to 75 and then to 81.8. When she again took her calcium and her two vitamin D preparations, her PTH decreased progressively to 62.1 and then to 33 on 11/26/10. On that date, her serum calcium was 10.2, her 25-hydroxy vitamin D was 32.2, and her 1, 25- dihydroxy vitamin D was 28.2. By then, however, she had stopped taking the vitamin D 50,000 international units weekly because her insurance company would not cover it. Her calcium levels, vitamin D levels, and PTH levels have fluctuated ever since, usually varying with her  intakes of calcium, vitamin D, and calcitriol. Due to concerns about hypercalcemia, her new PCP, Dr. Reesa Chew, stopped her calcitriol and vitamin D. I later had her re-start the vitamin D.  C. 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  D. In late 2014 she developed cancer of the ascending colon and was successfully treated with surgery.   E. Prior to her 04/11/19 visit with me, her new PCP, Dr. Reesa Chew, had discontinued her calcitriol, 0.25 mcg, twice weekly; Citracal with D twice daily, metoprolol, HCTZ, Montelukast, and vitamin D, 50,000 IU weekly; and B12.   3. The patient's last [facility] visit was on 07/24/19. At that visit we held her calcitriol and calcium, but continued her vitamin D supplementation. .    A. In the interim, she has been fairly healthy. Sometimes she trips on her own feet and falls. She has also been more unsteady. So walks with a wide-based, shuffling gait. Marland Kitchen  B. She still has some discomfort in her upper right back where she had shingles in March 2020.   C. Both Ms Klecker and Joy Daniels say that Ms Mamone has all her marbles. Her distant memory is intact. Her personality, social skills, and intelligence are so good, however, that she appears to be quite normal. Joy Daniels feels that she forgets some things, but not others.  Joy Daniels feels that mom's recent memory is usually pretty good. Ms. Fonner sense of time is not so good. She often forgets doctors' appointments and taking medications.  Carie Caddy is trying to help her mother do what she needs to do, but not always successfully. Mom still sometimes gets angry with Joy Daniels.   E. Ms Hillary continues to have urinary incontinence at times, but not as bad. She is now taking Topiaz which helps her sensation of bladder  fullness. Joy Daniels thinks that the Lucinda Dell is also causing edema. Joy Daniels says that Ms Bari is still taking in a lot of salt.   F. She shows no signs of recurrence of her mantle cell lymphoma or her colon cancer. She had her last follow up in September 2020.    G. Her energy is "gone". Joy Daniels says that her mother is more awake and alert,  She no longer takes gabapentin, but still sleeps well. She stays cold all the time. She is not walking much.    H. She has not had many attacks of shortness of breath recently. She says that her COPD is worse when she lays down. She still becomes somewhat short of breath if she is too active. She has not needed to use either her inhaler or her nebulizer.    I. 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 She no longer uses a walker when she walks outside the house, for example, to go shopping.    J. She still takes omeprazole 40, once daily; KCl ER 10 mEq, twice daily; losartan 25 mg/day; ezetimibe, 10 mg/day; and vitamin B6 daily. At my request, she has  resumed vitamin D, 50,000 IU per week.   K. Food tastes somewhat better. Joy Daniels says that her mother eats really good when she is with Joy Daniels, but favors sweets and junk food when she eats alone. Joy Daniels thinks that her appetite has improved since being on B6.   L. Ms Fregeau says that her hair is growing out more, but is harder to manage.  M. Ms Shands had a colonoscopy in April 2021 that was normal. She will not need to repeat this procedure.    4. Pertinent Review of Systems:  Constitutional: Ms. Mercer has a cane and a walker that she uses when she goes out, but not often at home.   Eyes: Vision is better since having bilateral cataractectomies, but the vision is not as good in her right eye. She had an eye appointment in September 2020. No changes were made in her glasses. She does not wear the glasses often. The ophthalmologist recommended eye  drops, but she doesn't use them.  Mouth: Mouth is often dry.    Neck: She occasionally feels sticking sensation in her left throat, usually when she has nasal congestion. She has not had any thyroid pains or inflammation. She occasionally still has left postero-lateral neck pains, as she has had off and on for years. Her range of motion of the cervical spine is much reduced. She no longer has difficulty swallowing unless her mouth  gets too dry.    Heart: She is not aware of any palpitations, chest pain, or chest pressure.  Lungs: Her COPD is probably unchanged. She still gets short of breath with minor exertion.  Gastrointestinal: She is constipated sometimes. She no longer takes Miralax and Benefiber proactively, but only takes these fiber supplements now when she feels constipated. She has no complaints of excessive hunger, acid reflux, upset stomach, stomach aches or pains. Legs: She has not had many calf cramps at night since her last visit.  Muscle mass and strength seem normal. There are no complaints of numbness, tingling, or burning. She has had some edema in the right leg where she had her blood clot. Feet: There are no obvious foot problems. She has occasional edema.  Neuro: Her balance is still not very good.    PAST MEDICAL, FAMILY, AND SOCIAL HISTORY:  Past Medical History:  Diagnosis Date   Combined hyperlipidemia    COPD (chronic obstructive pulmonary disease) (HCC)    Dysgeusia    GERD (gastroesophageal reflux disease)    Hypercalcemia    Hyperparathyroidism , secondary, non-renal (HCC)    Hypertension    Hypothyroidism, acquired, autoimmune    Iron deficiency    Lymphoma, mantle cell, multiple sites ([place])    Nontoxic multinodular goiter    Obesity (BMI 30.0-34.9)    Other osteoporosis    Pallor    Thyroiditis, autoimmune    Toxic nodular goiter    Vitamin D deficiency disease     Family History  Problem Relation Age of Onset   Diabetes Father     Cancer Sister    Heart disease Sister      Current Outpatient Medications:    aspirin 81 MG chewable tablet, Chew by mouth daily., Disp: , Rfl:    ezetimibe (ZETIA) 10 MG tablet, Take 10 mg by mouth daily., Disp: , Rfl:    fesoterodine (TOVIAZ) 4 MG TB24 tablet, Take 4 mg by mouth daily., Disp: , Rfl:    gabapentin (NEURONTIN) 300 MG capsule, Take 300 mg by mouth 2 (two) times daily., Disp: , Rfl:    loratadine (CLARITIN) 10 MG tablet, Take 10 mg by mouth daily., Disp: , Rfl:    metoprolol tartrate (LOPRESSOR) 50 MG tablet, Take 50 mg by mouth daily., Disp: , Rfl: 0   omeprazole (PRILOSEC) 10 MG capsule, Take 10 mg by mouth daily.  , Disp: , Rfl:    potassium chloride (K-DUR,KLOR-CON) 10 MEQ tablet, , Disp: , Rfl:    pyridoxine (B-6) 100 MG tablet, Take 100 mg by mouth daily., Disp: , Rfl:    Vitamin D, Ergocalciferol, (DRISDOL) 1.25 MG (50000 UT) CAPS capsule, TAKE 1 CAPSULE BY MOUTH EVERY 7 (SEVEN) DAYS., Disp: 13 capsule, Rfl: 1   calcitRIOL (ROCALTROL) 0.25 MCG capsule, TAKE 1 CAPSULE TWICE WEEKLY (Patient not taking: Reported on 01/08/2019), Disp: 25 capsule, Rfl: 3  Allergies as of 03/05/2020 - Review Complete 07/24/2019  Allergen Reaction Noted   Codeine  01/27/2011   Latex  07/10/2018    1. Work and Family: Her family is supportive. Rylan lives with her husband, daughter Joy Daniels, and three adult grandsons.    2. Activities: She is trying to walk in the house. 3. Smoking, alcohol, or drugs: None 4. Primary Care Provider: Dr. Reesa Chew, [facility], phone [phone], fax [phone]  REVIEW OF SYSTEMS: There are no other significant problems involving Tameria's other body systems.   Objective:  Vital Signs:  BP 118/68    Pulse  82    Wt 146 lb (66.2 kg)    BMI 28.51 kg/m     Ht Readings from Last 3 Encounters:  07/10/18 5' (1.524 m)  01/08/18 5' (1.524 m)  10/12/10 5' (1.524 m)   Wt Readings from Last 3 Encounters:  03/05/20 146 lb (66.2 kg)   07/24/19 144 lb 9.6 oz (65.6 kg)  04/11/19 142 lb 12.8 oz (64.8 kg)   PHYSICAL EXAM:  Sharian looks pretty good overall today. She is alert and bright. Her sense of humor is good. Her dementia is very mild and subtle today. She has gained 2 more pounds since her last visit.  Eyes: There is no arcus or proptosis.  Mouth: The oropharynx appears normal. The tongue appears normal. There is normal oral moisture. There is no obvious gingivitis. Neck: There are no bruits present. The thyroid gland appears normal in size. The thyroid gland is normal at approximately 18 grams in size. The consistency of the thyroid gland is normal. There is no thyroid tenderness to palpation. Lungs: The lungs are clear. Air movement is good. Heart: The heart rhythm and rate appear normal. Heart sounds S1 and S2 are normal. I do not appreciate any pathologic heart murmurs. Abdomen: The abdominal size is enlarged. Bowel sounds are normal. The abdomen is soft and non-tender. There is no obviously palpable hepatomegaly, splenomegaly, or other masses.  Arms: Muscle mass appears appropriate for age. Hands: There is a trace hand tremor. Phalangeal and metacarpophalangeal joints appear normal. Palms are normal. Legs: Muscle mass appears appropriate for age. There is trace edema.  Neurologic: Muscle strength is normal for age and gender in both the upper and the lower extremities. Muscle tone appears normal. Sensation to touch is normal in the legs.  LAB DATA:   Labs 07/24/19: TSH 3.89, free T4 1.1, free T3 2.7; calcium 10.7 (ref 8.6-10.4), PTH 82 (ref 14-64), 25-OH vitamin D 55, calcitriol 43 (ref 18-72)  Labs 12/28/18: PTH 66 (ref 15-65), calcium 11.3 (ref 8.7-10.3), 25-OH vitamin D 39.6 (ref 30-100); fasting glucose 100; creatinine 0.94, CO2 30 (ref 20-29); uric acid 6.4 (ref 2.5-7.1)  Labs 07/10/18: PTH 120 (ref 14-64), calcium 10.8 (ref 8.6-10.4), 25-OH vitamin D 51  Labs 02/23/18: PTH 65, calcium 10.9, 1,25 vitamin D 45  (ref 18-72)   Labs 01/08/18: TSH 2.08, free T4 1.1, free T3 2.2; PTH 68, calcium 10.9, 25-OH vitamin D 37  Labs 07/10/17: TSH 1.96, free T4 1.0, free T3 2.6; CMP normal, except calcium 10.5; 25-OH vitamin D 46; PTH 74 (14-64), vitamin B12 403 (ref 200-1,100), vitamin B6 18.9 (ref 2.1-21.7)  Labs 01/05/17: PTH 71 (ref 14-64), calcium 10.3, 25-OH vitamin D 58, 1,25-dihydroxy vitamin D 56 (ref 18-72)  Labs 10/04/16: Surgical pathology: Peripheral blood flow cytometry: Relative larger amount of T cells than B cells and reversal of the CD4:CD8 ratio  Labs 06/24/16: TSH 2.36, free T4 0,5, free T3 2.5; CMP normal, except for bilirubin 1.6 and ALT 40; 25-OH vitamin D 42  Labs 12/22/15: TSH 2.66, free T4 1.2, free T3 2.4; CMP normal except for AST 39 and ALT 48; PTH 79 (ref 14-64), calcium 10.0,  25-OH vitamin D 47, 1,25-dihydroxy vitamin d 40 (ref 18.72)  Labs: 11/18/14: 25-OH vitamin D 32.9, calcium 10.1; ALT 42; cholesterol 237, triglycerides 290, HDL 46, LDL 133  Labs 08/14/14: 25-OH vitamin D 31.9, calcium 10.6; ALT 33; TSH 3.47   Labs 11/05/13: TSH 2.902, free T4 1.06, free T3 2.5; 1,25-dihydroxy vitamin D 39; 25-hydroxy vitamin D  47, PTH 72, calcium 10.1  Labs 06/11/13: 1,25-dihydroxy vitamin D 39, 25-hydroxy vitamin D 47, PTH 72, calcium 10.1 ; TSH 2.901, free T4 1.06, free T3  2.5  Labs 12/10/12: Hgb 11.1, Hct 33.6%; TSH 2.964, free T4 1.06, free T3 2.4; 25-hydroxy vitamin D 37, PTH 89.4, calcium 9.9; CMP normal  Labs 06/11/12: CMP was normal, to include a calcium of 9.9; TSH 2.195,  free T4 0.98, free T3 2.6; PTH 102.3, 25-hydroxy vitamin D 29, 1,25-dihydroxy vitamin D 57.  Labs 11/17/11: PTH 70.9,calcium 10.0, 25-hydroxy vitamin D 40, 1,25-dihydroxy vitamin D 67, TSH 2.312. Free T4  0.97, Free T3 2.4  Labs 05/19/11: PTH 64, calcium 10.4, 25-hydroxy vitamin D 36, 1,25-dihydroxy vitamin D 47, TSH 2.497, free T4 1.04, free T3 2.6  IMAGING:  Thyroid US 01/25/18: 0.7 cm peripheral calcified nodule,  right inferior; 0.8 cm inferior right nodule with coarse calcifications, previously 1.8 cm, no progression for greater than 5 years; 0.7 cm peripherally calcified cyst in superior left lobe, stable for greater than 5 years; 0.8 cm hyperechoic nodules in mid left lobe, previously 1.6 cm, stable for greater than 5 years. No nodule met criteria for biopsy.    Assessment and Plan:   ASSESSMENT:  1-3. Obesity/unintentional weight loss/dysgeusia:   A. Ms Rawlinson says that food tastes better, Joy Daniels feels that her appetite has improved since beginning vitamin B6 therapy. Her tongue looks normal, so she does not have any obvious glossitis.   B. She has recently re-gained more weight.  4. Vitamin D deficiency:   A. The patient's 25-hydroxy vitamin D levels in April 2017, October 2017, April 2018, October 2018, April 2019, October 2019, and April 2020 were normal.  B. Her 1,25-dihydroxy vitamin D (calcitriol) levels in April 2017, April 2018, and June 2019 were also normal while taking calcitriol.   C. The combination of oral vitamin D and oral calcitriol that she was taking in the past had worked for her.  She had previously been taken off all vitamin D and calcium due to having a more elevated calcium level, but later resumed weekly vitamin D. Her lab tests in November 2020 were better.   5-6. Thyroiditis/multinodular goiter:   A. Her Hashimoto's disease was clinically quiescent at her last 3 visits and is quiescent today. Her thyroid gland was again within normal limits for size at her last 2 visits and again today. The episodic "tenderness" that she has had previously and the process of waxing and waning of thyroid gland size are c/w thyroiditis.    B. She was euthyroid again in April and October 2017, in October 2018, in April 2019. Her TFTs in November 2020 were in the low-normal range.   C. Her thyroid US in May 2019 showed several unremarkable small nodules.  7. Dyspepsia: She is doing better on  omeprazole. 8-9. Hyperparathyroidism, secondary to calcium and vitamin D intake deficiency/hypercalcemia:   A. Her PTH had been mildly elevated for several years, but her calciums had been in the upper quartile of the normal range or slightly elevated.   B. Her calcium in October 2018 was at the upper end of the normal range. Her calcium levels in April, June, and October 2019 were mildly elevated. Her PTH in October was very high. It appeared at that time that she might be slowly converting from secondary hyperparathyroidism to tertiary hyperparathyroidism.   C. In April 2020 her PTH decreased to just above the upper limit of normal, but her calcium was much higher.  These findings suggest that her parathyroid glands will respond to her ambient calcium level, although imperfectly.   D. While I understood why her PCP stopped the calcium, vitamin D, and calcitriol medications, I suspect that these changes will cause her secondary/tertiary hyperparathyroidism to worsen.  If so, it will be important to keep her calcium levels relatively high and her vitamin D levels normal in order to control the PTH. We need to find a calcium/vitamin D/calcitriol regimen that controls the PTH without causing too high a calcium level. If we and she are lucky, the calcium levels will not increase too much and she will not need parathyroidectomy.   E. Depending upon what her recent lab results are, we may need to perform additional lab studies and perhaps a new thyroid US looking for a parathyroid adenoma or a sestamibi scan.   10. Fatigue, other: Patient's fatigue is still present, which I suspect is due in part to her chemotherapy and to here low activity level. She is very deconditioned. She needs to walk more.   11. Lymphoma: Her lymphoma is in remission, which is wonderful.  12. Colon cancer: Her colon cancer is also in remission, which is also wonderful.  13. Calf cramps and restless legs:  These problems have resolved. I  again  recommended that she stretch the calves before going to bed.  14. Memory problem: She appears to be doing pretty well overall, but needs ongoing support from [place].   15. Combined hyperlipidemia: Dr. Reesa Chew is following this problem for her.  16. Pallor: She will need a CBC and CMP when she sees her oncologist. 17. Hair loss: Her scalp is somewhat thin, but there has been any real change at her last several visits. I have talked with her about Rogaine for Women in the past, but she declined. She says today that her hair is unchanged. Joy Daniels concurred.    PLAN:  1. Diagnostic: Repeat TFTs, calcium, PTH, 25-OH vitamin D, and the calcitriol level now. If needed, repeat US of the thyroid gland to look for a parathyroid adenoma. We many also need a sestamibi scan.   2. Therapeutic: Continue to hold calcitriol and calcium to see if the calcium level decreases without causing too much of an increase in PTH. Continue vitamin B12 and continue vitamin D for now. Continue to take a multivitamin pill and one vitamin B6 capsule daily 3. 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 We need to keep up her intake of calcium and vitamin D to the extent that we can, but we also need to control the PTH as well as we can. Unfortunately, if her PTH function worsens she might need parathyroid surgery. 4. Follow-up: 4 months  Level of Service: This visit lasted in excess of 60 minutes. More than 50% of the visit was devoted to counseling.  Tillman Sers, MD, CDE Adult and Pediatric Endocrinology 03/05/2020 11:13 AM

## 2020-03-08 LAB — VITAMIN D 1,25 DIHYDROXY
Vitamin D 1, 25 (OH)2 Total: 65 pg/mL (ref 18–72)
Vitamin D2 1, 25 (OH)2: 47 pg/mL
Vitamin D3 1, 25 (OH)2: 18 pg/mL

## 2020-03-08 LAB — T3, FREE: T3, Free: 2.5 pg/mL (ref 2.3–4.2)

## 2020-03-08 LAB — VITAMIN D 25 HYDROXY (VIT D DEFICIENCY, FRACTURES): Vit D, 25-Hydroxy: 111 ng/mL — ABNORMAL HIGH (ref 30–100)

## 2020-03-08 LAB — PTH, INTACT AND CALCIUM
Calcium: 10.8 mg/dL — ABNORMAL HIGH (ref 8.6–10.4)
PTH: 76 pg/mL — ABNORMAL HIGH (ref 14–64)

## 2020-03-08 LAB — T4, FREE: Free T4: 1.1 ng/dL (ref 0.8–1.8)

## 2020-03-08 LAB — TSH: TSH: 3.23 mIU/L (ref 0.40–4.50)

## 2020-03-10 DIAGNOSIS — I10 Essential (primary) hypertension: Secondary | ICD-10-CM | POA: Diagnosis not present

## 2020-03-10 DIAGNOSIS — E782 Mixed hyperlipidemia: Secondary | ICD-10-CM | POA: Diagnosis not present

## 2020-03-16 ENCOUNTER — Telehealth (INDEPENDENT_AMBULATORY_CARE_PROVIDER_SITE_OTHER): Payer: Self-pay | Admitting: *Deleted

## 2020-03-16 NOTE — Telephone Encounter (Signed)
Spoke to patients daughter, advised that per Dr. Tobe Sos:  Thyroid tests were low-normal, but better.  Vitamin D was too high.  Activated vitamin D was good.  PTH was still elevated, but lower. Calcium was a bit higher.  Please stop the vitamin D for two weeks, then resume taking the 50,000 IU every other week.      She voiced understanding of the new instructions.

## 2020-05-06 ENCOUNTER — Other Ambulatory Visit (INDEPENDENT_AMBULATORY_CARE_PROVIDER_SITE_OTHER): Payer: Self-pay | Admitting: "Endocrinology

## 2020-05-06 DIAGNOSIS — E559 Vitamin D deficiency, unspecified: Secondary | ICD-10-CM

## 2020-05-07 ENCOUNTER — Other Ambulatory Visit (INDEPENDENT_AMBULATORY_CARE_PROVIDER_SITE_OTHER): Payer: Self-pay

## 2020-05-07 DIAGNOSIS — E559 Vitamin D deficiency, unspecified: Secondary | ICD-10-CM

## 2020-05-07 MED ORDER — VITAMIN D (ERGOCALCIFEROL) 1.25 MG (50000 UNIT) PO CAPS: ORAL_CAPSULE | ORAL | 1 refills | Status: DC

## 2020-06-05 DIAGNOSIS — C8319 Mantle cell lymphoma, extranodal and solid organ sites: Secondary | ICD-10-CM | POA: Diagnosis not present

## 2020-06-09 DIAGNOSIS — Z8572 Personal history of non-Hodgkin lymphomas: Secondary | ICD-10-CM | POA: Diagnosis not present

## 2020-07-03 DIAGNOSIS — E119 Type 2 diabetes mellitus without complications: Secondary | ICD-10-CM | POA: Diagnosis not present

## 2020-07-06 ENCOUNTER — Other Ambulatory Visit: Payer: Self-pay

## 2020-07-06 ENCOUNTER — Encounter (INDEPENDENT_AMBULATORY_CARE_PROVIDER_SITE_OTHER): Payer: Self-pay | Admitting: "Endocrinology

## 2020-07-06 ENCOUNTER — Ambulatory Visit (INDEPENDENT_AMBULATORY_CARE_PROVIDER_SITE_OTHER): Payer: Medicare PPO | Admitting: "Endocrinology

## 2020-07-06 VITALS — BP 134/82 | HR 104 | Wt 145.2 lb

## 2020-07-06 DIAGNOSIS — E211 Secondary hyperparathyroidism, not elsewhere classified: Secondary | ICD-10-CM | POA: Diagnosis not present

## 2020-07-06 DIAGNOSIS — E042 Nontoxic multinodular goiter: Secondary | ICD-10-CM

## 2020-07-06 DIAGNOSIS — E559 Vitamin D deficiency, unspecified: Secondary | ICD-10-CM | POA: Diagnosis not present

## 2020-07-06 DIAGNOSIS — R1013 Epigastric pain: Secondary | ICD-10-CM

## 2020-07-06 DIAGNOSIS — Z8579 Personal history of other malignant neoplasms of lymphoid, hematopoietic and related tissues: Secondary | ICD-10-CM

## 2020-07-06 DIAGNOSIS — Z85038 Personal history of other malignant neoplasm of large intestine: Secondary | ICD-10-CM

## 2020-07-06 DIAGNOSIS — R432 Parageusia: Secondary | ICD-10-CM | POA: Diagnosis not present

## 2020-07-06 DIAGNOSIS — E063 Autoimmune thyroiditis: Secondary | ICD-10-CM | POA: Diagnosis not present

## 2020-07-06 NOTE — Patient Instructions (Signed)
Follow up visit in 6 months. 

## 2020-07-06 NOTE — Progress Notes (Signed)
Subjective:  Patient Name: Kellin Fifer Date of Birth: 1941/03/07  MRN: [id number]  [name]  presents at today's clinic visit for follow-up of her secondary hyperparathyroidism, hypocalcemia, vitamin D deficiency, iron deficiency anemia, dysgeusia, dyspepsia, intermittent abnormal TFTs/hypothyroidism, thyroiditis, nontoxic multinodular goiter, s/p colon cancer, s/p mantle cell lymphoma, and mild dementia.  HISTORY OF PRESENT ILLNESS:   [name] is a 79 y.o. Caucasian woman.  Lesleyann was accompanied by her daughter, [name].   1. [name] was first referred to me on 02/01/05 by her primary care physician, Dr. Micheal Likens, for follow up of toxic nodular goiter. The patient was then 79 years of age.   A.  The patient had been previously evaluated by Dr. Genelle Bal, an adult endocrinologist based in [place]. Dr. London Pepper was performing an outreach clinic at the [facility] in [place], [place]. He evaluated the patient for hyperthyroidism and found that she had a toxic nodular goiter. He arranged for her to have radioactive iodine treatment on 12/13/04 with 24.5 mCi of I-131. When Dr. London Pepper retired, I was asked to handle the outreach clinic for the next several months. At the patient's first visit with me, many of her hyperthyroid symptoms and signs had significantly improved. Past medical history was positive for hypertension, osteoporosis, hip pains, dyspepsia, and GERD. She had a previous injury to her right hand that required surgical repair. Family history was positive for cancer in one sister and a heart attack in another sister. Her brother had diabetes and had a stroke. On physical examination she was hypertensive. She had a 25+ gram goiter. She also had pallor of the nailbeds of her fingers.   B. At her next follow up visit in [place] on 03/29/05, she felt much better. Her energy was much improved. Muscle strength was improving. She was still significantly bothered by her excessive belly hunger that  was causing weight gain. TFTs from 03/22/05 were normal, with a TSH of 1.11, a free T4 of 1.22, and a free T3 of 2.8. I started her on Nexium 40 mg per day. Since I was getting ready to terminate the outreach clinic in [place], I made arrangements for her to be followed at my clinic in [place].   2. During the last 15 years that I have followed her here in [place], we have dealt with several different problems:  A. I followed two aspects of her multinodular goiter, the nodules themselves and her thyroid hormone status.   1. A thyroid ultrasound performed on 10/26/06 showed a 1.5 cm dominant hyperechoic mass in the left upper pole. There were 2 smaller nodules in the right lobe measuring 6.9 and 7.6 mm in diameter. On 12/14/06 a fine needle aspirate was performed on the dominant nodule in the left upper lobe. The pathologists diagnosis was: "Rare benign follicular cells, abundant histiocytes and colloid. No neoplasms identified." A follow up thyroid ultrasound performed on 11/05/07 showed an echogenic nodule in the mid left lobe measuring 1.7 x 1.1 x 0.8 cm. This was not grossly changed from images obtained during the previous fine needle aspirate. There was also a smaller nodule in the left lobe measuring 1.3 x 0.6 cm.  In the inferior aspect of the right lobe there was a nodule measuring 1.1 x 0.7 x 0.5. Her next thyroid ultrasound performed on 01/14/2009 showed a partially calcified nodule in the lower pole of the right lobe measuring 1.8 x 0.6 x 0.8. By report, this nodule had increased somewhat in size. However, because the images from  the 2009 study were not available, no direct comparison could be made. The largest nodule in the left lobe measured 1.6 x 1.0 x 0.8., essentially unchanged from the previous study. Since her clinical exam has been stable since 2010 I did not repeat her thyroid US until 01/25/18. As shown below, her two nodules that were >1 cm in diameter had shrunk down to <1 cm in diameter.  None of her nodules look suspicious or met criteria for biopsy.    2. The patient remained euthyroid through the spring of 2010. In September of 2010 and again in April of 2011 she became mildly hypothyroid. It appeared that she was having some flare ups of Hashimoto's disease at those times. Her TFTs subsequently normalized on 11/26/10.  B. Because of her known osteoporosis, I wanted to treat her with a bisphosphonate. Since she had previously not tolerated Actonel well, she was reluctant. I finally convinced her to try Boniva, but she later discontinued that medication as well. She was not at all interested in doing Forteo injections. To further try to minimize bone loss, I monitored her bone mineral parameters closely. Up through her visit on 04/17/07 her parameters were normal, but she had many difficulties in taking all of her calcium and vitamin D pills, in part due to constipation. Unfortunately, by 12/03/07 her 25-hydroxy vitamin D dropped to 24.2 and her 1,25- dihydroxy vitamin D dropped to 23.3. It appeared not only was she deficient in vitamin D intake, but her kidneys were also not optimally converting 25-hydroxy vitamin D to 1, 25-dihydroxy hydroxy vitamin D. I prescribed vitamin D, 50,000 IU per week. I also added calcitriol, 0.25 mcg per week. When she took these medications, her bone parameters normalized. In early 2010, however, she had stopped taking these medications. Her PTH rose to 75 and then to 81.8. When she again took her calcium and her two vitamin D preparations, her PTH decreased progressively to 62.1 and then to 33 on 11/26/10. On that date, her serum calcium was 10.2, her 25-hydroxy vitamin D was 32.2, and her 1, 25- dihydroxy vitamin D was 28.2. By then, however, she had stopped taking the vitamin D 50,000 international units weekly because her insurance company would not cover it. Her calcium levels, vitamin D levels, and PTH levels have fluctuated ever since, usually varying with her  intakes of calcium, vitamin D, and calcitriol. Due to concerns about hypercalcemia, her new PCP, Dr. Reesa Chew, stopped her calcitriol and vitamin D. I later had her re-start the vitamin D.  C. 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  D. In late 2014 she developed cancer of the ascending colon and was successfully treated with surgery.   E. Prior to her 04/11/19 visit with me, her new PCP, Dr. Reesa Chew, had discontinued her calcitriol, 0.25 mcg, twice weekly; Citracal with D twice daily, metoprolol, HCTZ, Montelukast, and vitamin D, 50,000 IU weekly; and B12.   3. The patient's last [facility] visit was on 03/05/20. At that visit we held her calcitriol and calcium, but continued her vitamin D supplementation. However, after reviewing her lab results, I asked her to hold the vitamin D supplementation for two weeks, then take the 50,000 IU every other week.   A. In the interim, she has been fairly healthy. She  has had two Moderna vaccine doses, but has not yet had the booster.   B. She continues to walk with a wide-based gait. She has not fallen, but sometimes has to catch herself to keep from falling. Sometimes she trips on her own right foot. She has also been more unsteady.   B. She still has some itching discomfort in her upper right back where she had shingles in March 2020.   D. Both Ms Sunderlin and [name] say that Ms Marlar has had some memory difficulties, but she still has all her marbles. Her distant memory is intact. Her personality, social skills, and intelligence are so good, however, that she appears to be quite normal. [name] feels that she forgets some things, but not others.  [name] feels that mom's recent memory is usually pretty good. Ms. Ramthun sense of time is not so  good. She often forgets doctors' appointments and taking medications. She also gets distracted easily.  Chiquita Loth is trying to help her mother do what she needs to do, but not always successfully. Mom still sometimes gets angry with [name].   F. Ms Westergard continues to have urinary incontinence at times, but not as bad. She is now taking Topiaz which helps her sensation of bladder fullness a little.   Angela Nevin thinks that her edema is less. Ms Meter says she is trying to cut back on her salt intake.    H. She shows no signs of recurrence of her mantle cell lymphoma or her colon cancer. She had her last oncology follow up in October 2021.  I. Her energy is very low. She tends to sit a lot and not get up. [name] says that her mother is more awake and alert,  She no longer takes gabapentin, but still sleeps fairly well. She tends to nap in the afternoon. She stays cold all the time. She is not walking much.    J. She has not had many attacks of shortness of breath recently. She says that her COPD is worse when she lays down on her back. She still becomes somewhat short of breath if she is too active. She has not needed to use either her inhaler or her nebulizer.    K. 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   L. She still takes omeprazole 40, once daily; KCl ER 10 mEq, twice daily; losartan 25 mg/day; ezetimibe, 10 mg/day; and vitamin B6 daily. At my request, she has  resumed vitamin D, 50,000 IU every other week.   M. Food tastes better. She is eating more and gaining weight.   N. Ms Robben says that her hair is growing out more, but is harder to manage.  Jenetta Downer Ms Worden had a colonoscopy in April 2021 that was normal. She will not need to repeat this procedure.    4. Pertinent Review of Systems:  Constitutional: Ms. Kopp has a cane and a walker that she uses when she goes out, but not often at home.   Eyes: Vision is better  since having bilateral cataractectomies, but the vision is not as good in her right eye. She had an eye appointment in September 2021. No changes were made in her glasses. She does not wear the glasses often. The ophthalmologist recommended eye drops, but she doesn't use them.  Mouth: Mouth is often dry.    Neck: She occasionally feels sticking sensation in her left throat, usually when she has  nasal congestion. She has not had any thyroid pains or inflammation. She occasionally still has left postero-lateral neck pains, as she has had off and on for years. Her range of motion of the cervical spine is much reduced. She no longer has difficulty swallowing unless her mouth gets too dry.    Heart: She is not aware of any palpitations, chest pain, or chest pressure.  Lungs: Her COPD is probably unchanged. She still gets short of breath with minor exertion.  Gastrointestinal: She is constipated sometimes, but less so. She no longer takes Miralax and Benefiber proactively, but only takes these fiber supplements now when she feels constipated. She has no complaints of excessive hunger, acid reflux, upset stomach, stomach aches or pains. Hand: No problems Legs: She has not had many calf cramps at night since her last visit.  Muscle mass and strength seem normal. There are no complaints of numbness, tingling, or burning. She has had some edema in the right leg where she had her blood clot. Feet: There are no obvious foot problems. She has occasional edema.  Neuro: Her balance is still not very good.    PAST MEDICAL, FAMILY, AND SOCIAL HISTORY:  Past Medical History:  Diagnosis Date  . Combined hyperlipidemia   . COPD (chronic obstructive pulmonary disease) ([place])   . Dysgeusia   . GERD (gastroesophageal reflux disease)   . Hypercalcemia   . Hyperparathyroidism , secondary, non-renal ([place])   . Hypertension   . Hypothyroidism, acquired, autoimmune   . Iron deficiency   . Lymphoma, mantle cell, multiple  sites ([place])   . Nontoxic multinodular goiter   . Obesity (BMI 30.0-34.9)   . Other osteoporosis   . Pallor   . Thyroiditis, autoimmune   . Toxic nodular goiter   . Vitamin D deficiency disease     Family History  Problem Relation Age of Onset  . Diabetes Father   . Cancer Sister   . Heart disease Sister      Current Outpatient Medications:  .  aspirin 81 MG chewable tablet, Chew by mouth daily., Disp: , Rfl:  .  calcitRIOL (ROCALTROL) 0.25 MCG capsule, TAKE 1 CAPSULE TWICE WEEKLY (Patient not taking: Reported on 01/08/2019), Disp: 25 capsule, Rfl: 3 .  ezetimibe (ZETIA) 10 MG tablet, Take 10 mg by mouth daily., Disp: , Rfl:  .  fesoterodine (TOVIAZ) 4 MG TB24 tablet, Take 4 mg by mouth daily., Disp: , Rfl:  .  gabapentin (NEURONTIN) 300 MG capsule, Take 300 mg by mouth 2 (two) times daily., Disp: , Rfl:  .  loratadine (CLARITIN) 10 MG tablet, Take 10 mg by mouth daily., Disp: , Rfl:  .  losartan (COZAAR) 100 MG tablet, , Disp: , Rfl:  .  metoprolol tartrate (LOPRESSOR) 50 MG tablet, Take 50 mg by mouth daily., Disp: , Rfl: 0 .  omeprazole (PRILOSEC) 40 MG capsule, , Disp: , Rfl:  .  potassium chloride (K-DUR,KLOR-CON) 10 MEQ tablet, , Disp: , Rfl:  .  potassium citrate (UROCIT-K) 10 MEQ (1080 MG) SR tablet, , Disp: , Rfl:  .  pravastatin (PRAVACHOL) 40 MG tablet, , Disp: , Rfl:  .  pyridoxine (B-6) 100 MG tablet, Take 100 mg by mouth daily., Disp: , Rfl:  .  Vitamin D, Ergocalciferol, (DRISDOL) 1.25 MG (50000 UNIT) CAPS capsule, TAKE 1 CAPSULE BY MOUTH EVERY OTHER WEEK, Disp: 13 capsule, Rfl: 1  Allergies as of 07/06/2020 - Review Complete 07/06/2020  Allergen Reaction Noted  . Codeine  01/27/2011  .  Latex  07/10/2018    1. Work and Family: Her family is supportive. Kiley lives with her husband, daughter [name], and three adult grandsons.    2. Activities: She is trying to walk in the house. 3. Smoking, alcohol, or drugs: None 4. Primary Care Provider: Dr. Reesa Chew, [facility], phone [phone], fax [phone]  REVIEW OF SYSTEMS: There are no other significant problems involving Aubreigh's other body systems.   Objective:  Vital Signs:  BP 134/82   Pulse (!) 104   Wt 145 lb 3.2 oz (65.9 kg)   BMI 28.36 kg/m     Ht Readings from Last 3 Encounters:  07/10/18 5' (1.524 m)  01/08/18 5' (1.524 m)  10/12/10 5' (1.524 m)   Wt Readings from Last 3 Encounters:  07/06/20 145 lb 3.2 oz (65.9 kg)  03/05/20 146 lb (66.2 kg)  07/24/19 144 lb 9.6 oz (65.6 kg)   PHYSICAL EXAM: She has not had any of her medications today. Heart rate by palpation was 92.   Larra looks pretty good overall today. She is alert and bright. Her sense of humor is good. Her dementia is very mild and subtle today. She often defers to [place] when I ask questions about her medications or visits to other providers She has lost 3/4 of a pound since her last visit.  Eyes: There is no arcus or proptosis.  Mouth: The oropharynx appears normal. The tongue appears normal. There is normal oral moisture. There is no obvious gingivitis. Neck: There are no bruits present. The thyroid gland appears normal in size. The thyroid gland is normal at approximately 18 grams in size. The consistency of the thyroid gland is normal. There is no thyroid tenderness to palpation. Lungs: The lungs are clear. Air movement is good. Heart: The heart rhythm and rate appear normal. Heart sounds S1 and S2 are normal. I do not appreciate any pathologic heart murmurs. Abdomen: The abdominal size is mildly enlarged. Bowel sounds are normal. The abdomen is soft and non-tender. There is no obviously palpable hepatomegaly, splenomegaly, or other masses.  Arms: Muscle mass appears appropriate for age. Hands: There is a trace-to-1+ hand tremor. Phalangeal and metacarpophalangeal joints appear normal. Palms are normal. Legs: Muscle mass appears appropriate for age. There is no edema.  Neurologic: Muscle strength is normal  for age and gender in both the upper and the lower extremities. Muscle tone appears normal. Sensation to touch is normal in the legs and dorsa of the feet.  LAB DATA:   Labs 03/05/20: TSH 3.23, free T4 1.1, free T3 2.5; PTH 76 (ref 14-64), calcium 10.8 (ref 8.6-10.4); calcitriol 65 (ref 18-72), 25-OH vitamin D 111 (ref 30-100)  Labs 07/24/19: TSH 3.89, free T4 1.1, free T3 2.7; calcium 10.7 (ref 8.6-10.4), PTH 82 (ref 14-64), calcitriol 43 (ref 18-72),  25-OH vitamin D 55  Labs 12/28/18: PTH 66 (ref 15-65), calcium 11.3 (ref 8.7-10.3), 25-OH vitamin D 39.6 (ref 30-100); fasting glucose 100; creatinine 0.94, CO2 30 (ref 20-29); uric acid 6.4 (ref 2.5-7.1)  Labs 07/10/18: PTH 120 (ref 14-64), calcium 10.8 (ref 8.6-10.4), 25-OH vitamin D 51  Labs 02/23/18: PTH 65, calcium 10.9, 1,25 vitamin D 45 (ref 18-72)   Labs 01/08/18: TSH 2.08, free T4 1.1, free T3 2.2; PTH 68, calcium 10.9, 25-OH vitamin D 37  Labs 07/10/17: TSH 1.96, free T4 1.0, free T3 2.6; CMP normal, except calcium 10.5; 25-OH vitamin D 46; PTH 74 (14-64), vitamin B12 403 (ref 200-1,100), vitamin B6 18.9 (ref 2.1-21.7)  Labs 01/05/17: PTH 71 (ref 14-64), calcium 10.3, 25-OH vitamin D 58, 1,25-dihydroxy vitamin D 56 (ref 18-72)  Labs 10/04/16: Surgical pathology: Peripheral blood flow cytometry: Relative larger amount of T cells than B cells and reversal of the CD4:CD8 ratio  Labs 06/24/16: TSH 2.36, free T4 0,5, free T3 2.5; CMP normal, except for bilirubin 1.6 and ALT 40; 25-OH vitamin D 42  Labs 12/22/15: TSH 2.66, free T4 1.2, free T3 2.4; CMP normal except for AST 39 and ALT 48; PTH 79 (ref 14-64), calcium 10.0,  25-OH vitamin D 47, 1,25-dihydroxy vitamin d 40 (ref 18.72)  Labs: 11/18/14: 25-OH vitamin D 32.9, calcium 10.1; ALT 42; cholesterol 237, triglycerides 290, HDL 46, LDL 133  Labs 08/14/14: 25-OH vitamin D 31.9, calcium 10.6; ALT 33; TSH 3.47   Labs 11/05/13: TSH 2.902, free T4 1.06, free T3 2.5; 1,25-dihydroxy vitamin D 39;  25-hydroxy vitamin D 47, PTH 72, calcium 10.1  Labs 06/11/13: 1,25-dihydroxy vitamin D 39, 25-hydroxy vitamin D 47, PTH 72, calcium 10.1 ; TSH 2.901, free T4 1.06, free T3  2.5  Labs 12/10/12: Hgb 11.1, Hct 33.6%; TSH 2.964, free T4 1.06, free T3 2.4; 25-hydroxy vitamin D 37, PTH 89.4, calcium 9.9; CMP normal  Labs 06/11/12: CMP was normal, to include a calcium of 9.9; TSH 2.195,  free T4 0.98, free T3 2.6; PTH 102.3, 25-hydroxy vitamin D 29, 1,25-dihydroxy vitamin D 57.  Labs 11/17/11: PTH 70.9,calcium 10.0, 25-hydroxy vitamin D 40, 1,25-dihydroxy vitamin D 67, TSH 2.312. Free T4  0.97, Free T3 2.4  Labs 05/19/11: PTH 64, calcium 10.4, 25-hydroxy vitamin D 36, 1,25-dihydroxy vitamin D 47, TSH 2.497, free T4 1.04, free T3 2.6  IMAGING:  Thyroid US 01/25/18: 0.7 cm peripheral calcified nodule, right inferior; 0.8 cm inferior right nodule with coarse calcifications, previously 1.8 cm, no progression for greater than 5 years; 0.7 cm peripherally calcified cyst in superior left lobe, stable for greater than 5 years; 0.8 cm hyperechoic nodules in mid left lobe, previously 1.6 cm, stable for greater than 5 years. No nodule met criteria for biopsy.    Assessment and Plan:   ASSESSMENT:  1-3. Obesity/unintentional weight loss/dysgeusia:   A. Ms Braud says that food tastes better, [name] feels that her appetite has improved since beginning vitamin B6 therapy. Her tongue looks normal, so she does not have any obvious glossitis.   B. She has recently lost a small amount of weight.  4. Vitamin D deficiency:   A. The patient's 25-hydroxy vitamin D levels in April 2017, October 2017, April 2018, October 2018, April 2019, October 2019, and April 2020 were normal. Her 25-OH D level in June 2020 was too high, so I asked her to reduce the dosage to every other week.   B. Her 1,25-dihydroxy vitamin D (calcitriol) levels in April 2017, April 2018, June 2019, November 2020, and June 2021 were normal.   C. The  combination of oral vitamin D and oral calcitriol that she was taking in the past had worked for her.  She had previously been taken off all vitamin D and calcium due to having a more elevated calcium level, but later resumed weekly vitamin D.  5-6. Thyroiditis/multinodular goiter:   A. Her Hashimoto's disease was clinically quiescent at her last 3 visits and is quiescent today. Her thyroid gland was again within normal limits for size at her last 3 visits and again today. The episodic "tenderness" that she has had previously and the process of waxing and waning of thyroid  gland size are c/w thyroiditis.    B. She was euthyroid again in April and October 2017, in October 2018, in April 2019. Her TFTs in November 2020 were in the low-normal range. Her TFTs in June 2021 were also in the low-normal range.   C. Her thyroid US in May 2019 showed several unremarkable small nodules.  7. Dyspepsia: She is doing better on omeprazole. 8-9. Hyperparathyroidism, secondary to calcium and vitamin D intake deficiency/hypercalcemia:   A. Her PTH had been mildly elevated for several years, but her calciums had been in the upper quartile of the normal range or slightly elevated.   B. Her calcium in October 2018 was at the upper end of the normal range. Her calcium levels in April, June, and October 2019 were mildly elevated. Her PTH in October was very high. It appeared at that time that she might be slowly converting from secondary hyperparathyroidism to tertiary hyperparathyroidism.   C. In April 2020 her PTH decreased to just above the upper limit of normal, but her calcium was much higher. In November 2020 her calcium was lower, but still elevated. Her PTH had decreased. In June 2021 her calcium increased a bit and her PTH decreased a bit, again showing a physiologic pattern. These findings suggest that her parathyroid glands will respond to her ambient calcium level, although imperfectly.   D. While I understood why  her PCP stopped the calcium, vitamin D, and calcitriol medications, I suspect that these changes will cause her secondary/tertiary hyperparathyroidism to worsen.  If so, it will be important to keep her calcium levels relatively high and her vitamin D levels normal in order to control the PTH. We need to find a calcium/vitamin D/calcitriol regimen that controls the PTH without causing too high a calcium level. If we and she are lucky, the calcium levels will not increase too much and she will not need parathyroidectomy.   E. Depending upon what her recent lab results are, we may need to perform additional lab studies and perhaps a new thyroid US looking for a parathyroid adenoma or a sestamibi scan.   10. Fatigue, other: Patient's fatigue is still present, which I suspect is due in part to her chemotherapy and to her low activity level. She is very deconditioned. She needs to walk more.   11. Lymphoma: Her lymphoma is in remission, which is wonderful.  12. Colon cancer: Her colon cancer is also in remission, which is also wonderful.  13. Calf cramps and restless legs:  These problems do not occur very often anymore. I  again  recommended that she stretch the calves before going to bed.  14. Memory problem: She appears to be doing pretty well overall, but needs ongoing support from [place].   15. Combined hyperlipidemia: Dr. Reesa Chew is following this problem for her.  16. Pallor: She will need a CBC and CMP when she sees her oncologist. 17. Hair loss: Her scalp is somewhat thin, but there has been any real change at her last several visits. I have talked with her about Rogaine for Women in the past, but she declined. She says today that her hair is unchanged. [name] concurred.    PLAN:  1. Diagnostic: Repeat TFTs, calcium, PTH, 25-OH vitamin D now. If needed, repeat US of the thyroid gland to look for a parathyroid adenoma. We many also need a sestamibi scan.   2. Therapeutic: Continue to hold calcitriol  and calcium to see if the calcium level decreases without causing too  much of an increase in PTH. Continue vitamin B12 and continue vitamin D for now. Continue to take a multivitamin pill and one vitamin B6 capsule daily 3. 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 We need to keep up her intake of calcium and vitamin D to the extent that we can, but we also need to control the PTH as well as we can. Unfortunately, if her PTH function worsens she might need parathyroid surgery. 4. Follow-up: 6 months  Level of Service: This visit lasted in excess of 60 minutes. More than 50% of the visit was devoted to counseling.  Tillman Sers, MD, CDE Adult and Pediatric Endocrinology 07/06/2020 10:18 AM

## 2020-07-07 LAB — VITAMIN D 25 HYDROXY (VIT D DEFICIENCY, FRACTURES): Vit D, 25-Hydroxy: 86 ng/mL (ref 30–100)

## 2020-07-07 LAB — T3, FREE: T3, Free: 2.4 pg/mL (ref 2.3–4.2)

## 2020-07-07 LAB — PTH, INTACT AND CALCIUM
Calcium: 10.9 mg/dL — ABNORMAL HIGH (ref 8.6–10.4)
PTH: 118 pg/mL — ABNORMAL HIGH (ref 14–64)

## 2020-07-07 LAB — TSH: TSH: 3.18 mIU/L (ref 0.40–4.50)

## 2020-07-07 LAB — T4, FREE: Free T4: 1.1 ng/dL (ref 0.8–1.8)

## 2020-07-15 ENCOUNTER — Other Ambulatory Visit (INDEPENDENT_AMBULATORY_CARE_PROVIDER_SITE_OTHER): Payer: Self-pay

## 2020-07-15 DIAGNOSIS — E042 Nontoxic multinodular goiter: Secondary | ICD-10-CM

## 2020-07-15 DIAGNOSIS — E063 Autoimmune thyroiditis: Secondary | ICD-10-CM

## 2020-07-17 ENCOUNTER — Other Ambulatory Visit (INDEPENDENT_AMBULATORY_CARE_PROVIDER_SITE_OTHER): Payer: Self-pay

## 2020-07-31 ENCOUNTER — Ambulatory Visit
Admission: RE | Admit: 2020-07-31 | Discharge: 2020-07-31 | Disposition: A | Discharge status: Home / Self Care | Payer: Medicare PPO | Source: Ambulatory Visit | Attending: "Endocrinology | Admitting: "Endocrinology

## 2020-07-31 DIAGNOSIS — E042 Nontoxic multinodular goiter: Secondary | ICD-10-CM | POA: Diagnosis not present

## 2020-09-02 DIAGNOSIS — E782 Mixed hyperlipidemia: Secondary | ICD-10-CM | POA: Diagnosis not present

## 2020-09-02 DIAGNOSIS — I10 Essential (primary) hypertension: Secondary | ICD-10-CM | POA: Diagnosis not present

## 2020-09-02 DIAGNOSIS — Z23 Encounter for immunization: Secondary | ICD-10-CM | POA: Diagnosis not present

## 2020-09-02 DIAGNOSIS — K219 Gastro-esophageal reflux disease without esophagitis: Secondary | ICD-10-CM | POA: Diagnosis not present

## 2020-12-17 DIAGNOSIS — S0003XA Contusion of scalp, initial encounter: Secondary | ICD-10-CM | POA: Diagnosis not present

## 2020-12-17 DIAGNOSIS — S0993XA Unspecified injury of face, initial encounter: Secondary | ICD-10-CM | POA: Diagnosis not present

## 2020-12-17 DIAGNOSIS — M542 Cervicalgia: Secondary | ICD-10-CM | POA: Diagnosis not present

## 2020-12-17 DIAGNOSIS — S0033XA Contusion of nose, initial encounter: Secondary | ICD-10-CM | POA: Diagnosis not present

## 2020-12-17 DIAGNOSIS — M25562 Pain in left knee: Secondary | ICD-10-CM | POA: Diagnosis not present

## 2020-12-17 DIAGNOSIS — M25512 Pain in left shoulder: Secondary | ICD-10-CM | POA: Diagnosis not present

## 2020-12-17 DIAGNOSIS — M25561 Pain in right knee: Secondary | ICD-10-CM | POA: Diagnosis not present

## 2020-12-17 DIAGNOSIS — S0083XA Contusion of other part of head, initial encounter: Secondary | ICD-10-CM | POA: Diagnosis not present

## 2020-12-17 DIAGNOSIS — S01511A Laceration without foreign body of lip, initial encounter: Secondary | ICD-10-CM | POA: Diagnosis not present

## 2020-12-17 DIAGNOSIS — Z043 Encounter for examination and observation following other accident: Secondary | ICD-10-CM | POA: Diagnosis not present

## 2020-12-17 DIAGNOSIS — R079 Chest pain, unspecified: Secondary | ICD-10-CM | POA: Diagnosis not present

## 2021-01-03 NOTE — Progress Notes (Deleted)
Subjective:  Patient Name: Joy Daniels Date of Birth: 1941/03/07  MRN: [id number]  Joy Daniels  presents at today's clinic visit for follow-up of her secondary hyperparathyroidism, hypocalcemia, vitamin D deficiency, iron deficiency anemia, dysgeusia, dyspepsia, intermittent abnormal TFTs/hypothyroidism, thyroiditis, nontoxic multinodular goiter, s/p colon cancer, s/p mantle cell lymphoma, and mild dementia.  HISTORY OF PRESENT ILLNESS:   Joy Daniels is a 80 y.o. Caucasian woman.  Lesleyann was accompanied by her daughter, Joy Daniels.   1. [name] was first referred to me on 02/01/05 by her primary care physician, Dr. Micheal Likens, for follow up of toxic nodular goiter. The patient was then 80 years of age.   A.  The patient had been previously evaluated by Dr. Genelle Bal, an adult endocrinologist based in [place]. Dr. London Pepper was performing an outreach clinic at the [facility] in [place], [place]. He evaluated the patient for hyperthyroidism and found that she had a toxic nodular goiter. He arranged for her to have radioactive iodine treatment on 12/13/04 with 24.5 mCi of I-131. When Dr. London Pepper retired, I was asked to handle the outreach clinic for the next several months. At the patient's first visit with me, many of her hyperthyroid symptoms and signs had significantly improved. Past medical history was positive for hypertension, osteoporosis, hip pains, dyspepsia, and GERD. She had a previous injury to her right hand that required surgical repair. Family history was positive for cancer in one sister and a heart attack in another sister. Her brother had diabetes and had a stroke. On physical examination she was hypertensive. She had a 25+ gram goiter. She also had pallor of the nailbeds of her fingers.   B. At her next follow up visit in [place] on 03/29/05, she felt much better. Her energy was much improved. Muscle strength was improving. She was still significantly bothered by her excessive belly hunger that  was causing weight gain. TFTs from 03/22/05 were normal, with a TSH of 1.11, a free T4 of 1.22, and a free T3 of 2.8. I started her on Nexium 40 mg per day. Since I was getting ready to terminate the outreach clinic in [place], I made arrangements for her to be followed at my clinic in [place].   2. During the last 15 years that I have followed her here in [place], we have dealt with several different problems:  A. I followed two aspects of her multinodular goiter, the nodules themselves and her thyroid hormone status.   1. A thyroid ultrasound performed on 10/26/06 showed a 1.5 cm dominant hyperechoic mass in the left upper pole. There were 2 smaller nodules in the right lobe measuring 6.9 and 7.6 mm in diameter. On 12/14/06 a fine needle aspirate was performed on the dominant nodule in the left upper lobe. The pathologists diagnosis was: "Rare benign follicular cells, abundant histiocytes and colloid. No neoplasms identified." A follow up thyroid ultrasound performed on 11/05/07 showed an echogenic nodule in the mid left lobe measuring 1.7 x 1.1 x 0.8 cm. This was not grossly changed from images obtained during the previous fine needle aspirate. There was also a smaller nodule in the left lobe measuring 1.3 x 0.6 cm.  In the inferior aspect of the right lobe there was a nodule measuring 1.1 x 0.7 x 0.5. Her next thyroid ultrasound performed on 01/14/2009 showed a partially calcified nodule in the lower pole of the right lobe measuring 1.8 x 0.6 x 0.8. By report, this nodule had increased somewhat in size. However, because the images from  the 2009 study were not available, no direct comparison could be made. The largest nodule in the left lobe measured 1.6 x 1.0 x 0.8., essentially unchanged from the previous study. Since her clinical exam has been stable since 2010 I did not repeat her thyroid US until 01/25/18. As shown below, her two nodules that were >1 cm in diameter had shrunk down to <1 cm in diameter.  None of her nodules look suspicious or met criteria for biopsy.    2. The patient remained euthyroid through the spring of 2010. In September of 2010 and again in April of 2011 she became mildly hypothyroid. It appeared that she was having some flare ups of Hashimoto's disease at those times. Her TFTs subsequently normalized on 11/26/10.  B. Because of her known osteoporosis, I wanted to treat her with a bisphosphonate. Since she had previously not tolerated Actonel well, she was reluctant. I finally convinced her to try Boniva, but she later discontinued that medication as well. She was not at all interested in doing Forteo injections. To further try to minimize bone loss, I monitored her bone mineral parameters closely. Up through her visit on 04/17/07 her parameters were normal, but she had many difficulties in taking all of her calcium and vitamin D pills, in part due to constipation. Unfortunately, by 12/03/07 her 25-hydroxy vitamin D dropped to 24.2 and her 1,25- dihydroxy vitamin D dropped to 23.3. It appeared not only was she deficient in vitamin D intake, but her kidneys were also not optimally converting 25-hydroxy vitamin D to 1, 25-dihydroxy hydroxy vitamin D. I prescribed vitamin D, 50,000 IU per week. I also added calcitriol, 0.25 mcg per week. When she took these medications, her bone parameters normalized. In early 2010, however, she had stopped taking these medications. Her PTH rose to 75 and then to 81.8. When she again took her calcium and her two vitamin D preparations, her PTH decreased progressively to 62.1 and then to 33 on 11/26/10. On that date, her serum calcium was 10.2, her 25-hydroxy vitamin D was 32.2, and her 1, 25- dihydroxy vitamin D was 28.2. By then, however, she had stopped taking the vitamin D 50,000 international units weekly because her insurance company would not cover it. Her calcium levels, vitamin D levels, and PTH levels have fluctuated ever since, usually varying with her  intakes of calcium, vitamin D, and calcitriol. Due to concerns about hypercalcemia, her new PCP, Dr. Reesa Chew, stopped her calcitriol and vitamin D. I later had her re-start the vitamin D.  C. 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  D. In late 2014 she developed cancer of the ascending colon and was successfully treated with surgery.   E. Prior to her 04/11/19 visit with me, her new PCP, Dr. Reesa Chew, had discontinued her calcitriol, 0.25 mcg, twice weekly; Citracal with D twice daily, metoprolol, HCTZ, Montelukast, and vitamin D, 50,000 IU weekly; and B12.   3. The patient's last [facility] visit was on 07/06/20. At that visit we held her calcitriol and calcium, but continued her vitamin D supplementation. However, after reviewing her lab results, I asked her to hold the vitamin D supplementation for two weeks, then take the 50,000 IU every other week.   A. In the interim, she has been fairly healthy. She  has had two Moderna vaccine doses, but has not yet had the booster.   B. She continues to walk with a wide-based gait. She has not fallen, but sometimes has to catch herself to keep from falling. Sometimes she trips on her own right foot. She has also been more unsteady.   B. She still has some itching discomfort in her upper right back where she had shingles in March 2020.   D. Both Ms Sunderlin and Joy Daniels say that Ms Marlar has had some memory difficulties, but she still has all her marbles. Her distant memory is intact. Her personality, social skills, and intelligence are so good, however, that she appears to be quite normal. Joy Daniels feels that she forgets some things, but not others.  Joy Daniels feels that mom's recent memory is usually pretty good. Ms. Ramthun sense of time is not so  good. She often forgets doctors' appointments and taking medications. She also gets distracted easily.  Chiquita Loth is trying to help her mother do what she needs to do, but not always successfully. Mom still sometimes gets angry with Joy Daniels.   F. Ms Westergard continues to have urinary incontinence at times, but not as bad. She is now taking Topiaz which helps her sensation of bladder fullness a little.   Angela Nevin thinks that her edema is less. Ms Meter says she is trying to cut back on her salt intake.    H. She shows no signs of recurrence of her mantle cell lymphoma or her colon cancer. She had her last oncology follow up in October 2021.  I. Her energy is very low. She tends to sit a lot and not get up. Joy Daniels says that her mother is more awake and alert,  She no longer takes gabapentin, but still sleeps fairly well. She tends to nap in the afternoon. She stays cold all the time. She is not walking much.    J. She has not had many attacks of shortness of breath recently. She says that her COPD is worse when she lays down on her back. She still becomes somewhat short of breath if she is too active. She has not needed to use either her inhaler or her nebulizer.    K. 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   L. She still takes omeprazole 40, once daily; KCl ER 10 mEq, twice daily; losartan 25 mg/day; ezetimibe, 10 mg/day; and vitamin B6 daily. At my request, she has  resumed vitamin D, 50,000 IU every other week.   M. Food tastes better. She is eating more and gaining weight.   N. Ms Robben says that her hair is growing out more, but is harder to manage.  Jenetta Downer Ms Worden had a colonoscopy in April 2021 that was normal. She will not need to repeat this procedure.    4. Pertinent Review of Systems:  Constitutional: Ms. Kopp has a cane and a walker that she uses when she goes out, but not often at home.   Eyes: Vision is better  since having bilateral cataractectomies, but the vision is not as good in her right eye. She had an eye appointment in September 2021. No changes were made in her glasses. She does not wear the glasses often. The ophthalmologist recommended eye drops, but she doesn't use them.  Mouth: Mouth is often dry.    Neck: She occasionally feels sticking sensation in her left throat, usually when she has  nasal congestion. She has not had any thyroid pains or inflammation. She occasionally still has left postero-lateral neck pains, as she has had off and on for years. Her range of motion of the cervical spine is much reduced. She no longer has difficulty swallowing unless her mouth gets too dry.    Heart: She is not aware of any palpitations, chest pain, or chest pressure.  Lungs: Her COPD is probably unchanged. She still gets short of breath with minor exertion.  Gastrointestinal: She is constipated sometimes, but less so. She no longer takes Miralax and Benefiber proactively, but only takes these fiber supplements now when she feels constipated. She has no complaints of excessive hunger, acid reflux, upset stomach, stomach aches or pains. Hand: No problems Legs: She has not had many calf cramps at night since her last visit.  Muscle mass and strength seem normal. There are no complaints of numbness, tingling, or burning. She has had some edema in the right leg where she had her blood clot. Feet: There are no obvious foot problems. She has occasional edema.  Neuro: Her balance is still not very good.    PAST MEDICAL, FAMILY, AND SOCIAL HISTORY:  Past Medical History:  Diagnosis Date  . Combined hyperlipidemia   . COPD (chronic obstructive pulmonary disease) ([place])   . Dysgeusia   . GERD (gastroesophageal reflux disease)   . Hypercalcemia   . Hyperparathyroidism , secondary, non-renal ([place])   . Hypertension   . Hypothyroidism, acquired, autoimmune   . Iron deficiency   . Lymphoma, mantle cell, multiple  sites ([place])   . Nontoxic multinodular goiter   . Obesity (BMI 30.0-34.9)   . Other osteoporosis   . Pallor   . Thyroiditis, autoimmune   . Toxic nodular goiter   . Vitamin D deficiency disease     Family History  Problem Relation Age of Onset  . Diabetes Father   . Cancer Sister   . Heart disease Sister      Current Outpatient Medications:  .  aspirin 81 MG chewable tablet, Chew by mouth daily., Disp: , Rfl:  .  calcitRIOL (ROCALTROL) 0.25 MCG capsule, TAKE 1 CAPSULE TWICE WEEKLY (Patient not taking: Reported on 01/08/2019), Disp: 25 capsule, Rfl: 3 .  ezetimibe (ZETIA) 10 MG tablet, Take 10 mg by mouth daily., Disp: , Rfl:  .  fesoterodine (TOVIAZ) 4 MG TB24 tablet, Take 4 mg by mouth daily., Disp: , Rfl:  .  gabapentin (NEURONTIN) 300 MG capsule, Take 300 mg by mouth 2 (two) times daily., Disp: , Rfl:  .  loratadine (CLARITIN) 10 MG tablet, Take 10 mg by mouth daily., Disp: , Rfl:  .  losartan (COZAAR) 100 MG tablet, , Disp: , Rfl:  .  metoprolol tartrate (LOPRESSOR) 50 MG tablet, Take 50 mg by mouth daily., Disp: , Rfl: 0 .  omeprazole (PRILOSEC) 40 MG capsule, , Disp: , Rfl:  .  potassium chloride (K-DUR,KLOR-CON) 10 MEQ tablet, , Disp: , Rfl:  .  potassium citrate (UROCIT-K) 10 MEQ (1080 MG) SR tablet, , Disp: , Rfl:  .  pravastatin (PRAVACHOL) 40 MG tablet, , Disp: , Rfl:  .  pyridoxine (B-6) 100 MG tablet, Take 100 mg by mouth daily., Disp: , Rfl:  .  Vitamin D, Ergocalciferol, (DRISDOL) 1.25 MG (50000 UNIT) CAPS capsule, TAKE 1 CAPSULE BY MOUTH EVERY OTHER WEEK, Disp: 13 capsule, Rfl: 1  Allergies as of 01/04/2021 - Review Complete 07/06/2020  Allergen Reaction Noted  . Codeine  01/27/2011  .  Latex  07/10/2018    1. Work and Family: Her family is supportive. Eudell lives with her husband, daughter Joy Daniels, and three adult grandsons.    2. Activities: She is trying to walk in the house. 3. Smoking, alcohol, or drugs: None 4. Primary Care Provider: Dr. Reesa Chew, [facility], phone [phone], fax [phone]  REVIEW OF SYSTEMS: There are no other significant problems involving Erikka's other body systems.   Objective:  Vital Signs:  There were no vitals taken for this visit.    Ht Readings from Last 3 Encounters:  07/10/18 5' (1.524 m)  01/08/18 5' (1.524 m)  10/12/10 5' (1.524 m)   Wt Readings from Last 3 Encounters:  07/06/20 145 lb 3.2 oz (65.9 kg)  03/05/20 146 lb (66.2 kg)  07/24/19 144 lb 9.6 oz (65.6 kg)   PHYSICAL EXAM: She has not had any of her medications today. Heart rate by palpation was 92.   Cloyce looks pretty good overall today. She is alert and bright. Her sense of humor is good. Her dementia is very mild and subtle today. She often defers to [place] when I ask questions about her medications or visits to other providers She has lost 3/4 of a pound since her last visit.  Eyes: There is no arcus or proptosis.  Mouth: The oropharynx appears normal. The tongue appears normal. There is normal oral moisture. There is no obvious gingivitis. Neck: There are no bruits present. The thyroid gland appears normal in size. The thyroid gland is normal at approximately 18 grams in size. The consistency of the thyroid gland is normal. There is no thyroid tenderness to palpation. Lungs: The lungs are clear. Air movement is good. Heart: The heart rhythm and rate appear normal. Heart sounds S1 and S2 are normal. I do not appreciate any pathologic heart murmurs. Abdomen: The abdominal size is mildly enlarged. Bowel sounds are normal. The abdomen is soft and non-tender. There is no obviously palpable hepatomegaly, splenomegaly, or other masses.  Arms: Muscle mass appears appropriate for age. Hands: There is a trace-to-1+ hand tremor. Phalangeal and metacarpophalangeal joints appear normal. Palms are normal. Legs: Muscle mass appears appropriate for age. There is no edema.  Neurologic: Muscle strength is normal for age and gender in both the  upper and the lower extremities. Muscle tone appears normal. Sensation to touch is normal in the legs and dorsa of the feet.  LAB DATA:   Labs 07/06/20: TSH 3.1.8, free T4 1.1, free T3 2.4; PTH 118, calcium 10.9, 25-Oh vitamin D 86  Labs 03/05/20: TSH 3.23, free T4 1.1, free T3 2.5; PTH 76 (ref 14-64), calcium 10.8 (ref 8.6-10.4); calcitriol 65 (ref 18-72), 25-OH vitamin D 111 (ref 30-100)  Labs 07/24/19: TSH 3.89, free T4 1.1, free T3 2.7; calcium 10.7 (ref 8.6-10.4), PTH 82 (ref 14-64), calcitriol 43 (ref 18-72),  25-OH vitamin D 55  Labs 12/28/18: PTH 66 (ref 15-65), calcium 11.3 (ref 8.7-10.3), 25-OH vitamin D 39.6 (ref 30-100); fasting glucose 100; creatinine 0.94, CO2 30 (ref 20-29); uric acid 6.4 (ref 2.5-7.1)  Labs 07/10/18: PTH 120 (ref 14-64), calcium 10.8 (ref 8.6-10.4), 25-OH vitamin D 51  Labs 02/23/18: PTH 65, calcium 10.9, 1,25 vitamin D 45 (ref 18-72)   Labs 01/08/18: TSH 2.08, free T4 1.1, free T3 2.2; PTH 68, calcium 10.9, 25-OH vitamin D 37  Labs 07/10/17: TSH 1.96, free T4 1.0, free T3 2.6; CMP normal, except calcium 10.5; 25-OH vitamin D 46; PTH 74 (14-64), vitamin B12 403 (ref 200-1,100),  vitamin B6 18.9 (ref 2.1-21.7)  Labs 01/05/17: PTH 71 (ref 14-64), calcium 10.3, 25-OH vitamin D 58, 1,25-dihydroxy vitamin D 56 (ref 18-72)  Labs 10/04/16: Surgical pathology: Peripheral blood flow cytometry: Relative larger amount of T cells than B cells and reversal of the CD4:CD8 ratio  Labs 06/24/16: TSH 2.36, free T4 0,5, free T3 2.5; CMP normal, except for bilirubin 1.6 and ALT 40; 25-OH vitamin D 42  Labs 12/22/15: TSH 2.66, free T4 1.2, free T3 2.4; CMP normal except for AST 39 and ALT 48; PTH 79 (ref 14-64), calcium 10.0,  25-OH vitamin D 47, 1,25-dihydroxy vitamin d 40 (ref 18.72)  Labs: 11/18/14: 25-OH vitamin D 32.9, calcium 10.1; ALT 42; cholesterol 237, triglycerides 290, HDL 46, LDL 133  Labs 08/14/14: 25-OH vitamin D 31.9, calcium 10.6; ALT 33; TSH 3.47   Labs 11/05/13:  TSH 2.902, free T4 1.06, free T3 2.5; 1,25-dihydroxy vitamin D 39; 25-hydroxy vitamin D 47, PTH 72, calcium 10.1  Labs 06/11/13: 1,25-dihydroxy vitamin D 39, 25-hydroxy vitamin D 47, PTH 72, calcium 10.1 ; TSH 2.901, free T4 1.06, free T3  2.5  Labs 12/10/12: Hgb 11.1, Hct 33.6%; TSH 2.964, free T4 1.06, free T3 2.4; 25-hydroxy vitamin D 37, PTH 89.4, calcium 9.9; CMP normal  Labs 06/11/12: CMP was normal, to include a calcium of 9.9; TSH 2.195,  free T4 0.98, free T3 2.6; PTH 102.3, 25-hydroxy vitamin D 29, 1,25-dihydroxy vitamin D 57.  Labs 11/17/11: PTH 70.9,calcium 10.0, 25-hydroxy vitamin D 40, 1,25-dihydroxy vitamin D 67, TSH 2.312. Free T4  0.97, Free T3 2.4  Labs 05/19/11: PTH 64, calcium 10.4, 25-hydroxy vitamin D 36, 1,25-dihydroxy vitamin D 47, TSH 2.497, free T4 1.04, free T3 2.6  IMAGING:  Thyroid US 07/31/20: One small nodule in right inferior pole, 6 mm in length, with coarse calcifications, was 7 mm. One small nodule in right inferior pole with peripheral calcifications, 6 mm. One small nodule in left mid-lobe, 8 mm, stable in size. None of these nodules med the criteria for biopsy or dedicated follow up.    Thyroid US 01/25/18: 0.7 cm peripheral calcified nodule, right inferior; 0.8 cm inferior right nodule with coarse calcifications, previously 1.8 cm, no progression for greater than 5 years; 0.7 cm peripherally calcified cyst in superior left lobe, stable for greater than 5 years; 0.8 cm hyperechoic nodules in mid left lobe, previously 1.6 cm, stable for greater than 5 years. No nodule met criteria for biopsy.    Assessment and Plan:   ASSESSMENT:  1-3. Obesity/unintentional weight loss/dysgeusia:   A. Ms Mcclish says that food tastes better, Joy Daniels feels that her appetite has improved since beginning vitamin B6 therapy. Her tongue looks normal, so she does not have any obvious glossitis.   B. She has recently lost a small amount of weight.  4. Vitamin D deficiency:   A. The  patient's 25-hydroxy vitamin D levels in April 2017, October 2017, April 2018, October 2018, April 2019, October 2019, and April 2020 were normal. Her 25-OH D level in June 2020 was too high, so I asked her to reduce the dosage to every other week.   B. Her 1,25-dihydroxy vitamin D (calcitriol) levels in April 2017, April 2018, June 2019, November 2020, and June 2021 were normal.   C. The combination of oral vitamin D and oral calcitriol that she was taking in the past had worked for her.  She had previously been taken off all vitamin D and calcium due to having a more elevated  calcium level, but later resumed weekly vitamin D.  5-6. Thyroiditis/multinodular goiter:   A. Her Hashimoto's disease was clinically quiescent at her last 3 visits and is quiescent today. Her thyroid gland was again within normal limits for size at her last 3 visits and again today. The episodic "tenderness" that she has had previously and the process of waxing and waning of thyroid gland size are c/w thyroiditis.    B. She was euthyroid again in April and October 2017, in October 2018, in April 2019. Her TFTs in November 2020 were in the low-normal range. Her TFTs in June 2021 were also in the low-normal range.   C. Her thyroid US in May 2019 showed several unremarkable small nodules.  7. Dyspepsia: She is doing better on omeprazole. 8-9. Hyperparathyroidism, secondary to calcium and vitamin D intake deficiency/hypercalcemia:   A. Her PTH had been mildly elevated for several years, but her calciums had been in the upper quartile of the normal range or slightly elevated.   B. Her calcium in October 2018 was at the upper end of the normal range. Her calcium levels in April, June, and October 2019 were mildly elevated. Her PTH in October was very high. It appeared at that time that she might be slowly converting from secondary hyperparathyroidism to tertiary hyperparathyroidism.   C. In April 2020 her PTH decreased to just above the  upper limit of normal, but her calcium was much higher. In November 2020 her calcium was lower, but still elevated. Her PTH had decreased. In June 2021 her calcium increased a bit and her PTH decreased a bit, again showing a physiologic pattern. These findings suggest that her parathyroid glands will respond to her ambient calcium level, although imperfectly.   D. While I understood why her PCP stopped the calcium, vitamin D, and calcitriol medications, I suspect that these changes will cause her secondary/tertiary hyperparathyroidism to worsen.  If so, it will be important to keep her calcium levels relatively high and her vitamin D levels normal in order to control the PTH. We need to find a calcium/vitamin D/calcitriol regimen that controls the PTH without causing too high a calcium level. If we and she are lucky, the calcium levels will not increase too much and she will not need parathyroidectomy.   E. Depending upon what her recent lab results are, we may need to perform additional lab studies and perhaps a new thyroid US looking for a parathyroid adenoma or a sestamibi scan.   10. Fatigue, other: Patient's fatigue is still present, which I suspect is due in part to her chemotherapy and to her low activity level. She is very deconditioned. She needs to walk more.   11. Lymphoma: Her lymphoma is in remission, which is wonderful.  12. Colon cancer: Her colon cancer is also in remission, which is also wonderful.  13. Calf cramps and restless legs:  These problems do not occur very often anymore. I  again  recommended that she stretch the calves before going to bed.  14. Memory problem: She appears to be doing pretty well overall, but needs ongoing support from [place].   15. Combined hyperlipidemia: Dr. Reesa Chew is following this problem for her.  16. Pallor: She will need a CBC and CMP when she sees her oncologist. 17. Hair loss: Her scalp is somewhat thin, but there has been any real change at her last  several visits. I have talked with her about Rogaine for Women in the past, but she declined. She  says today that her hair is unchanged. Joy Daniels concurred.    PLAN:  1. Diagnostic: Repeat TFTs, calcium, PTH, 25-OH vitamin D now. If needed, repeat US of the thyroid gland to look for a parathyroid adenoma. We many also need a sestamibi scan.   2. Therapeutic: Continue to hold calcitriol and calcium to see if the calcium level decreases without causing too much of an increase in PTH. Continue vitamin B12 and continue vitamin D for now. Continue to take a multivitamin pill and one vitamin B6 capsule daily 3. 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 We need to keep up her intake of calcium and vitamin D to the extent that we can, but we also need to control the PTH as well as we can. Unfortunately, if her PTH function worsens she might need parathyroid surgery. 4. Follow-up: 6 months  Level of Service: This visit lasted in excess of 60 minutes. More than 50% of the visit was devoted to counseling.  Tillman Sers, MD, CDE Adult and Pediatric Endocrinology 01/03/2021 9:32 PM

## 2021-01-04 ENCOUNTER — Ambulatory Visit (INDEPENDENT_AMBULATORY_CARE_PROVIDER_SITE_OTHER): Payer: Medicare PPO | Admitting: "Endocrinology

## 2021-01-23 DIAGNOSIS — Z85038 Personal history of other malignant neoplasm of large intestine: Secondary | ICD-10-CM | POA: Diagnosis not present

## 2021-01-23 DIAGNOSIS — R410 Disorientation, unspecified: Secondary | ICD-10-CM | POA: Diagnosis not present

## 2021-01-23 DIAGNOSIS — Z20828 Contact with and (suspected) exposure to other viral communicable diseases: Secondary | ICD-10-CM | POA: Diagnosis not present

## 2021-01-23 DIAGNOSIS — E860 Dehydration: Secondary | ICD-10-CM | POA: Diagnosis not present

## 2021-01-23 DIAGNOSIS — N12 Tubulo-interstitial nephritis, not specified as acute or chronic: Secondary | ICD-10-CM | POA: Diagnosis not present

## 2021-01-23 DIAGNOSIS — G9341 Metabolic encephalopathy: Secondary | ICD-10-CM | POA: Diagnosis not present

## 2021-01-23 DIAGNOSIS — B9689 Other specified bacterial agents as the cause of diseases classified elsewhere: Secondary | ICD-10-CM | POA: Diagnosis not present

## 2021-01-23 DIAGNOSIS — K449 Diaphragmatic hernia without obstruction or gangrene: Secondary | ICD-10-CM | POA: Diagnosis not present

## 2021-01-23 DIAGNOSIS — H1033 Unspecified acute conjunctivitis, bilateral: Secondary | ICD-10-CM | POA: Diagnosis not present

## 2021-01-23 DIAGNOSIS — N390 Urinary tract infection, site not specified: Secondary | ICD-10-CM | POA: Diagnosis not present

## 2021-01-23 DIAGNOSIS — N281 Cyst of kidney, acquired: Secondary | ICD-10-CM | POA: Diagnosis not present

## 2021-01-23 DIAGNOSIS — R531 Weakness: Secondary | ICD-10-CM | POA: Diagnosis not present

## 2021-01-23 DIAGNOSIS — I358 Other nonrheumatic aortic valve disorders: Secondary | ICD-10-CM | POA: Diagnosis not present

## 2021-01-23 DIAGNOSIS — I10 Essential (primary) hypertension: Secondary | ICD-10-CM | POA: Diagnosis not present

## 2021-01-24 DIAGNOSIS — G9341 Metabolic encephalopathy: Secondary | ICD-10-CM | POA: Diagnosis not present

## 2021-01-24 DIAGNOSIS — I10 Essential (primary) hypertension: Secondary | ICD-10-CM | POA: Diagnosis not present

## 2021-01-24 DIAGNOSIS — N12 Tubulo-interstitial nephritis, not specified as acute or chronic: Secondary | ICD-10-CM | POA: Diagnosis not present

## 2021-01-25 DIAGNOSIS — G9341 Metabolic encephalopathy: Secondary | ICD-10-CM | POA: Diagnosis not present

## 2021-01-25 DIAGNOSIS — N12 Tubulo-interstitial nephritis, not specified as acute or chronic: Secondary | ICD-10-CM | POA: Diagnosis not present

## 2021-01-25 DIAGNOSIS — I10 Essential (primary) hypertension: Secondary | ICD-10-CM | POA: Diagnosis not present

## 2021-01-27 DIAGNOSIS — N12 Tubulo-interstitial nephritis, not specified as acute or chronic: Secondary | ICD-10-CM | POA: Diagnosis not present

## 2021-01-27 DIAGNOSIS — B9620 Unspecified Escherichia coli [E. coli] as the cause of diseases classified elsewhere: Secondary | ICD-10-CM | POA: Diagnosis not present

## 2021-01-27 DIAGNOSIS — E119 Type 2 diabetes mellitus without complications: Secondary | ICD-10-CM | POA: Diagnosis not present

## 2021-01-27 DIAGNOSIS — I10 Essential (primary) hypertension: Secondary | ICD-10-CM | POA: Diagnosis not present

## 2021-01-27 DIAGNOSIS — R319 Hematuria, unspecified: Secondary | ICD-10-CM | POA: Diagnosis not present

## 2021-01-27 DIAGNOSIS — J449 Chronic obstructive pulmonary disease, unspecified: Secondary | ICD-10-CM | POA: Diagnosis not present

## 2021-01-27 DIAGNOSIS — G9341 Metabolic encephalopathy: Secondary | ICD-10-CM | POA: Diagnosis not present

## 2021-01-27 DIAGNOSIS — M1990 Unspecified osteoarthritis, unspecified site: Secondary | ICD-10-CM | POA: Diagnosis not present

## 2021-01-27 DIAGNOSIS — D509 Iron deficiency anemia, unspecified: Secondary | ICD-10-CM | POA: Diagnosis not present

## 2021-02-02 DIAGNOSIS — J449 Chronic obstructive pulmonary disease, unspecified: Secondary | ICD-10-CM | POA: Diagnosis not present

## 2021-02-02 DIAGNOSIS — R319 Hematuria, unspecified: Secondary | ICD-10-CM | POA: Diagnosis not present

## 2021-02-02 DIAGNOSIS — E119 Type 2 diabetes mellitus without complications: Secondary | ICD-10-CM | POA: Diagnosis not present

## 2021-02-02 DIAGNOSIS — N12 Tubulo-interstitial nephritis, not specified as acute or chronic: Secondary | ICD-10-CM | POA: Diagnosis not present

## 2021-02-02 DIAGNOSIS — D509 Iron deficiency anemia, unspecified: Secondary | ICD-10-CM | POA: Diagnosis not present

## 2021-02-02 DIAGNOSIS — G9341 Metabolic encephalopathy: Secondary | ICD-10-CM | POA: Diagnosis not present

## 2021-02-02 DIAGNOSIS — M1990 Unspecified osteoarthritis, unspecified site: Secondary | ICD-10-CM | POA: Diagnosis not present

## 2021-02-02 DIAGNOSIS — I10 Essential (primary) hypertension: Secondary | ICD-10-CM | POA: Diagnosis not present

## 2021-02-02 DIAGNOSIS — B9620 Unspecified Escherichia coli [E. coli] as the cause of diseases classified elsewhere: Secondary | ICD-10-CM | POA: Diagnosis not present

## 2021-02-05 DIAGNOSIS — I10 Essential (primary) hypertension: Secondary | ICD-10-CM | POA: Diagnosis not present

## 2021-02-05 DIAGNOSIS — N12 Tubulo-interstitial nephritis, not specified as acute or chronic: Secondary | ICD-10-CM | POA: Diagnosis not present

## 2021-02-05 DIAGNOSIS — R319 Hematuria, unspecified: Secondary | ICD-10-CM | POA: Diagnosis not present

## 2021-02-05 DIAGNOSIS — B9620 Unspecified Escherichia coli [E. coli] as the cause of diseases classified elsewhere: Secondary | ICD-10-CM | POA: Diagnosis not present

## 2021-02-05 DIAGNOSIS — M1990 Unspecified osteoarthritis, unspecified site: Secondary | ICD-10-CM | POA: Diagnosis not present

## 2021-02-05 DIAGNOSIS — J449 Chronic obstructive pulmonary disease, unspecified: Secondary | ICD-10-CM | POA: Diagnosis not present

## 2021-02-05 DIAGNOSIS — E119 Type 2 diabetes mellitus without complications: Secondary | ICD-10-CM | POA: Diagnosis not present

## 2021-02-05 DIAGNOSIS — G9341 Metabolic encephalopathy: Secondary | ICD-10-CM | POA: Diagnosis not present

## 2021-02-05 DIAGNOSIS — D509 Iron deficiency anemia, unspecified: Secondary | ICD-10-CM | POA: Diagnosis not present

## 2021-02-09 DIAGNOSIS — N12 Tubulo-interstitial nephritis, not specified as acute or chronic: Secondary | ICD-10-CM | POA: Diagnosis not present

## 2021-02-09 DIAGNOSIS — M1990 Unspecified osteoarthritis, unspecified site: Secondary | ICD-10-CM | POA: Diagnosis not present

## 2021-02-09 DIAGNOSIS — D509 Iron deficiency anemia, unspecified: Secondary | ICD-10-CM | POA: Diagnosis not present

## 2021-02-09 DIAGNOSIS — G9341 Metabolic encephalopathy: Secondary | ICD-10-CM | POA: Diagnosis not present

## 2021-02-09 DIAGNOSIS — E119 Type 2 diabetes mellitus without complications: Secondary | ICD-10-CM | POA: Diagnosis not present

## 2021-02-09 DIAGNOSIS — B9620 Unspecified Escherichia coli [E. coli] as the cause of diseases classified elsewhere: Secondary | ICD-10-CM | POA: Diagnosis not present

## 2021-02-09 DIAGNOSIS — J449 Chronic obstructive pulmonary disease, unspecified: Secondary | ICD-10-CM | POA: Diagnosis not present

## 2021-02-09 DIAGNOSIS — I10 Essential (primary) hypertension: Secondary | ICD-10-CM | POA: Diagnosis not present

## 2021-02-09 DIAGNOSIS — R319 Hematuria, unspecified: Secondary | ICD-10-CM | POA: Diagnosis not present

## 2021-02-11 DIAGNOSIS — B9620 Unspecified Escherichia coli [E. coli] as the cause of diseases classified elsewhere: Secondary | ICD-10-CM | POA: Diagnosis not present

## 2021-02-11 DIAGNOSIS — M1990 Unspecified osteoarthritis, unspecified site: Secondary | ICD-10-CM | POA: Diagnosis not present

## 2021-02-11 DIAGNOSIS — I10 Essential (primary) hypertension: Secondary | ICD-10-CM | POA: Diagnosis not present

## 2021-02-11 DIAGNOSIS — R319 Hematuria, unspecified: Secondary | ICD-10-CM | POA: Diagnosis not present

## 2021-02-11 DIAGNOSIS — G9341 Metabolic encephalopathy: Secondary | ICD-10-CM | POA: Diagnosis not present

## 2021-02-11 DIAGNOSIS — J449 Chronic obstructive pulmonary disease, unspecified: Secondary | ICD-10-CM | POA: Diagnosis not present

## 2021-02-11 DIAGNOSIS — E119 Type 2 diabetes mellitus without complications: Secondary | ICD-10-CM | POA: Diagnosis not present

## 2021-02-11 DIAGNOSIS — N12 Tubulo-interstitial nephritis, not specified as acute or chronic: Secondary | ICD-10-CM | POA: Diagnosis not present

## 2021-02-11 DIAGNOSIS — D509 Iron deficiency anemia, unspecified: Secondary | ICD-10-CM | POA: Diagnosis not present

## 2021-02-15 DIAGNOSIS — B9620 Unspecified Escherichia coli [E. coli] as the cause of diseases classified elsewhere: Secondary | ICD-10-CM | POA: Diagnosis not present

## 2021-02-15 DIAGNOSIS — R319 Hematuria, unspecified: Secondary | ICD-10-CM | POA: Diagnosis not present

## 2021-02-15 DIAGNOSIS — G9341 Metabolic encephalopathy: Secondary | ICD-10-CM | POA: Diagnosis not present

## 2021-02-15 DIAGNOSIS — J449 Chronic obstructive pulmonary disease, unspecified: Secondary | ICD-10-CM | POA: Diagnosis not present

## 2021-02-15 DIAGNOSIS — N12 Tubulo-interstitial nephritis, not specified as acute or chronic: Secondary | ICD-10-CM | POA: Diagnosis not present

## 2021-02-15 DIAGNOSIS — D509 Iron deficiency anemia, unspecified: Secondary | ICD-10-CM | POA: Diagnosis not present

## 2021-02-15 DIAGNOSIS — I10 Essential (primary) hypertension: Secondary | ICD-10-CM | POA: Diagnosis not present

## 2021-02-15 DIAGNOSIS — M1990 Unspecified osteoarthritis, unspecified site: Secondary | ICD-10-CM | POA: Diagnosis not present

## 2021-02-15 DIAGNOSIS — E119 Type 2 diabetes mellitus without complications: Secondary | ICD-10-CM | POA: Diagnosis not present

## 2021-02-19 DIAGNOSIS — B9620 Unspecified Escherichia coli [E. coli] as the cause of diseases classified elsewhere: Secondary | ICD-10-CM | POA: Diagnosis not present

## 2021-02-19 DIAGNOSIS — D509 Iron deficiency anemia, unspecified: Secondary | ICD-10-CM | POA: Diagnosis not present

## 2021-02-19 DIAGNOSIS — M1990 Unspecified osteoarthritis, unspecified site: Secondary | ICD-10-CM | POA: Diagnosis not present

## 2021-02-19 DIAGNOSIS — E119 Type 2 diabetes mellitus without complications: Secondary | ICD-10-CM | POA: Diagnosis not present

## 2021-02-19 DIAGNOSIS — N12 Tubulo-interstitial nephritis, not specified as acute or chronic: Secondary | ICD-10-CM | POA: Diagnosis not present

## 2021-02-19 DIAGNOSIS — J449 Chronic obstructive pulmonary disease, unspecified: Secondary | ICD-10-CM | POA: Diagnosis not present

## 2021-02-19 DIAGNOSIS — R319 Hematuria, unspecified: Secondary | ICD-10-CM | POA: Diagnosis not present

## 2021-02-19 DIAGNOSIS — G9341 Metabolic encephalopathy: Secondary | ICD-10-CM | POA: Diagnosis not present

## 2021-02-19 DIAGNOSIS — I10 Essential (primary) hypertension: Secondary | ICD-10-CM | POA: Diagnosis not present

## 2021-02-24 DIAGNOSIS — J449 Chronic obstructive pulmonary disease, unspecified: Secondary | ICD-10-CM | POA: Diagnosis not present

## 2021-02-24 DIAGNOSIS — H65111 Acute and subacute allergic otitis media (mucoid) (sanguinous) (serous), right ear: Secondary | ICD-10-CM | POA: Diagnosis not present

## 2021-02-24 DIAGNOSIS — N12 Tubulo-interstitial nephritis, not specified as acute or chronic: Secondary | ICD-10-CM | POA: Diagnosis not present

## 2021-02-25 DIAGNOSIS — E119 Type 2 diabetes mellitus without complications: Secondary | ICD-10-CM | POA: Diagnosis not present

## 2021-02-25 DIAGNOSIS — N12 Tubulo-interstitial nephritis, not specified as acute or chronic: Secondary | ICD-10-CM | POA: Diagnosis not present

## 2021-02-25 DIAGNOSIS — B9620 Unspecified Escherichia coli [E. coli] as the cause of diseases classified elsewhere: Secondary | ICD-10-CM | POA: Diagnosis not present

## 2021-02-25 DIAGNOSIS — J449 Chronic obstructive pulmonary disease, unspecified: Secondary | ICD-10-CM | POA: Diagnosis not present

## 2021-02-25 DIAGNOSIS — M1990 Unspecified osteoarthritis, unspecified site: Secondary | ICD-10-CM | POA: Diagnosis not present

## 2021-02-25 DIAGNOSIS — G9341 Metabolic encephalopathy: Secondary | ICD-10-CM | POA: Diagnosis not present

## 2021-02-25 DIAGNOSIS — R319 Hematuria, unspecified: Secondary | ICD-10-CM | POA: Diagnosis not present

## 2021-02-25 DIAGNOSIS — I10 Essential (primary) hypertension: Secondary | ICD-10-CM | POA: Diagnosis not present

## 2021-02-25 DIAGNOSIS — D509 Iron deficiency anemia, unspecified: Secondary | ICD-10-CM | POA: Diagnosis not present

## 2021-02-25 DIAGNOSIS — H65111 Acute and subacute allergic otitis media (mucoid) (sanguinous) (serous), right ear: Secondary | ICD-10-CM | POA: Diagnosis not present

## 2021-02-26 DIAGNOSIS — M1990 Unspecified osteoarthritis, unspecified site: Secondary | ICD-10-CM | POA: Diagnosis not present

## 2021-02-26 DIAGNOSIS — I10 Essential (primary) hypertension: Secondary | ICD-10-CM | POA: Diagnosis not present

## 2021-02-26 DIAGNOSIS — J449 Chronic obstructive pulmonary disease, unspecified: Secondary | ICD-10-CM | POA: Diagnosis not present

## 2021-02-26 DIAGNOSIS — R319 Hematuria, unspecified: Secondary | ICD-10-CM | POA: Diagnosis not present

## 2021-02-26 DIAGNOSIS — G9341 Metabolic encephalopathy: Secondary | ICD-10-CM | POA: Diagnosis not present

## 2021-02-26 DIAGNOSIS — N12 Tubulo-interstitial nephritis, not specified as acute or chronic: Secondary | ICD-10-CM | POA: Diagnosis not present

## 2021-02-26 DIAGNOSIS — E119 Type 2 diabetes mellitus without complications: Secondary | ICD-10-CM | POA: Diagnosis not present

## 2021-02-26 DIAGNOSIS — B9620 Unspecified Escherichia coli [E. coli] as the cause of diseases classified elsewhere: Secondary | ICD-10-CM | POA: Diagnosis not present

## 2021-02-26 DIAGNOSIS — D509 Iron deficiency anemia, unspecified: Secondary | ICD-10-CM | POA: Diagnosis not present

## 2021-03-02 DIAGNOSIS — I10 Essential (primary) hypertension: Secondary | ICD-10-CM | POA: Diagnosis not present

## 2021-03-02 DIAGNOSIS — R319 Hematuria, unspecified: Secondary | ICD-10-CM | POA: Diagnosis not present

## 2021-03-02 DIAGNOSIS — E119 Type 2 diabetes mellitus without complications: Secondary | ICD-10-CM | POA: Diagnosis not present

## 2021-03-02 DIAGNOSIS — M1990 Unspecified osteoarthritis, unspecified site: Secondary | ICD-10-CM | POA: Diagnosis not present

## 2021-03-02 DIAGNOSIS — N12 Tubulo-interstitial nephritis, not specified as acute or chronic: Secondary | ICD-10-CM | POA: Diagnosis not present

## 2021-03-02 DIAGNOSIS — D509 Iron deficiency anemia, unspecified: Secondary | ICD-10-CM | POA: Diagnosis not present

## 2021-03-02 DIAGNOSIS — J449 Chronic obstructive pulmonary disease, unspecified: Secondary | ICD-10-CM | POA: Diagnosis not present

## 2021-03-02 DIAGNOSIS — B9620 Unspecified Escherichia coli [E. coli] as the cause of diseases classified elsewhere: Secondary | ICD-10-CM | POA: Diagnosis not present

## 2021-03-02 DIAGNOSIS — G9341 Metabolic encephalopathy: Secondary | ICD-10-CM | POA: Diagnosis not present

## 2021-03-05 DIAGNOSIS — I10 Essential (primary) hypertension: Secondary | ICD-10-CM | POA: Diagnosis not present

## 2021-03-05 DIAGNOSIS — M25561 Pain in right knee: Secondary | ICD-10-CM | POA: Diagnosis not present

## 2021-03-05 DIAGNOSIS — H9203 Otalgia, bilateral: Secondary | ICD-10-CM | POA: Diagnosis not present

## 2021-03-05 DIAGNOSIS — R42 Dizziness and giddiness: Secondary | ICD-10-CM | POA: Diagnosis not present

## 2021-03-10 DIAGNOSIS — D509 Iron deficiency anemia, unspecified: Secondary | ICD-10-CM | POA: Diagnosis not present

## 2021-03-10 DIAGNOSIS — R319 Hematuria, unspecified: Secondary | ICD-10-CM | POA: Diagnosis not present

## 2021-03-10 DIAGNOSIS — I10 Essential (primary) hypertension: Secondary | ICD-10-CM | POA: Diagnosis not present

## 2021-03-10 DIAGNOSIS — M1990 Unspecified osteoarthritis, unspecified site: Secondary | ICD-10-CM | POA: Diagnosis not present

## 2021-03-10 DIAGNOSIS — G9341 Metabolic encephalopathy: Secondary | ICD-10-CM | POA: Diagnosis not present

## 2021-03-10 DIAGNOSIS — B9620 Unspecified Escherichia coli [E. coli] as the cause of diseases classified elsewhere: Secondary | ICD-10-CM | POA: Diagnosis not present

## 2021-03-10 DIAGNOSIS — N12 Tubulo-interstitial nephritis, not specified as acute or chronic: Secondary | ICD-10-CM | POA: Diagnosis not present

## 2021-03-10 DIAGNOSIS — J449 Chronic obstructive pulmonary disease, unspecified: Secondary | ICD-10-CM | POA: Diagnosis not present

## 2021-03-10 DIAGNOSIS — E119 Type 2 diabetes mellitus without complications: Secondary | ICD-10-CM | POA: Diagnosis not present

## 2021-03-17 DIAGNOSIS — G9341 Metabolic encephalopathy: Secondary | ICD-10-CM | POA: Diagnosis not present

## 2021-03-17 DIAGNOSIS — I10 Essential (primary) hypertension: Secondary | ICD-10-CM | POA: Diagnosis not present

## 2021-03-17 DIAGNOSIS — D509 Iron deficiency anemia, unspecified: Secondary | ICD-10-CM | POA: Diagnosis not present

## 2021-03-17 DIAGNOSIS — J449 Chronic obstructive pulmonary disease, unspecified: Secondary | ICD-10-CM | POA: Diagnosis not present

## 2021-03-17 DIAGNOSIS — R319 Hematuria, unspecified: Secondary | ICD-10-CM | POA: Diagnosis not present

## 2021-03-17 DIAGNOSIS — N12 Tubulo-interstitial nephritis, not specified as acute or chronic: Secondary | ICD-10-CM | POA: Diagnosis not present

## 2021-03-17 DIAGNOSIS — B9620 Unspecified Escherichia coli [E. coli] as the cause of diseases classified elsewhere: Secondary | ICD-10-CM | POA: Diagnosis not present

## 2021-03-17 DIAGNOSIS — M1990 Unspecified osteoarthritis, unspecified site: Secondary | ICD-10-CM | POA: Diagnosis not present

## 2021-03-17 DIAGNOSIS — E119 Type 2 diabetes mellitus without complications: Secondary | ICD-10-CM | POA: Diagnosis not present

## 2021-03-26 DIAGNOSIS — G9341 Metabolic encephalopathy: Secondary | ICD-10-CM | POA: Diagnosis not present

## 2021-03-26 DIAGNOSIS — J449 Chronic obstructive pulmonary disease, unspecified: Secondary | ICD-10-CM | POA: Diagnosis not present

## 2021-03-26 DIAGNOSIS — I10 Essential (primary) hypertension: Secondary | ICD-10-CM | POA: Diagnosis not present

## 2021-03-26 DIAGNOSIS — B9620 Unspecified Escherichia coli [E. coli] as the cause of diseases classified elsewhere: Secondary | ICD-10-CM | POA: Diagnosis not present

## 2021-03-26 DIAGNOSIS — R319 Hematuria, unspecified: Secondary | ICD-10-CM | POA: Diagnosis not present

## 2021-03-26 DIAGNOSIS — D509 Iron deficiency anemia, unspecified: Secondary | ICD-10-CM | POA: Diagnosis not present

## 2021-03-26 DIAGNOSIS — M1990 Unspecified osteoarthritis, unspecified site: Secondary | ICD-10-CM | POA: Diagnosis not present

## 2021-03-26 DIAGNOSIS — E119 Type 2 diabetes mellitus without complications: Secondary | ICD-10-CM | POA: Diagnosis not present

## 2021-03-26 DIAGNOSIS — N12 Tubulo-interstitial nephritis, not specified as acute or chronic: Secondary | ICD-10-CM | POA: Diagnosis not present

## 2021-04-06 NOTE — Progress Notes (Addendum)
Subjective:  Patient Name: Joy Daniels Date of Birth: [birth year]  MRN: [id number]  Joy Daniels  presents at today's clinic visit for follow-up of her secondary hyperparathyroidism, hypocalcemia, vitamin D deficiency, iron deficiency anemia, dysgeusia, dyspepsia, intermittent abnormal TFTs/hypothyroidism, thyroiditis, nontoxic multinodular goiter, s/p colon cancer, s/p mantle cell lymphoma, and mild dementia.  HISTORY OF PRESENT ILLNESS:   Joy Daniels is a 80 y.o. Caucasian woman.  Joy Daniels was accompanied by her daughter, Joy Daniels.   1. Ms. Forness was first referred to me on 02/01/05 by her primary care physician, Dr. Micheal Likens, for follow up of toxic nodular goiter. The patient was then 80 years of age.   A.  The patient had been previously evaluated by Dr. Genelle Bal, an adult endocrinologist based in [place]. Dr. London Pepper was performing an outreach clinic at the [facility] in [place], [place]. He evaluated the patient for hyperthyroidism and found that she had a toxic nodular goiter. He arranged for her to have radioactive iodine treatment on 12/13/04 with 24.5 mCi of I-131. When Dr. London Pepper retired, I was asked to handle the outreach clinic for the next several months. At the patient's first visit with me, many of her hyperthyroid symptoms and signs had significantly improved. Past medical history was positive for hypertension, osteoporosis, hip pains, dyspepsia, and GERD. She had a previous injury to her right hand that required surgical repair. Family history was positive for cancer in one sister and a heart attack in another sister. Her brother had diabetes and had a stroke. On physical examination she was hypertensive. She had a 25+ gram goiter. She also had pallor of the nailbeds of her fingers.   B. At her next follow up visit in [place] on 03/29/05, she felt much better. Her energy was much improved. Muscle strength was improving. She was still significantly bothered by her excessive belly hunger that  was causing weight gain. TFTs from 03/22/05 were normal, with a TSH of 1.11, a free T4 of 1.22, and a free T3 of 2.8. I started her on Nexium 40 mg per day. Since I was getting ready to terminate the outreach clinic in [place], I made arrangements for her to be followed at my clinic in [place].   2. During the last 16 years that I have followed her here in [place], we have dealt with several different problems:  A. I followed two aspects of her multinodular goiter, the nodules themselves and her thyroid hormone status.   1. A thyroid ultrasound performed on 10/26/06 showed a 1.5 cm dominant hyperechoic mass in the left upper pole. There were 2 smaller nodules in the right lobe measuring 6.9 and 7.6 mm in diameter. On 12/14/06 a fine needle aspirate was performed on the dominant nodule in the left upper lobe. The pathologists diagnosis was: "Rare benign follicular cells, abundant histiocytes and colloid. No neoplasms identified." A follow up thyroid ultrasound performed on 11/05/07 showed an echogenic nodule in the mid left lobe measuring 1.7 x 1.1 x 0.8 cm. This was not grossly changed from images obtained during the previous fine needle aspirate. There was also a smaller nodule in the left lobe measuring 1.3 x 0.6 cm.  In the inferior aspect of the right lobe there was a nodule measuring 1.1 x 0.7 x 0.5. Her next thyroid ultrasound performed on 01/14/2009 showed a partially calcified nodule in the lower pole of the right lobe measuring 1.8 x 0.6 x 0.8. By report, this nodule had increased somewhat in size. However, because the images from  the 2009 study were not available, no direct comparison could be made. The largest nodule in the left lobe measured 1.6 x 1.0 x 0.8., essentially unchanged from the previous study. Since her clinical exam has been stable since 2010 I did not repeat her thyroid US until 01/25/18. As shown below, her two nodules that were >1 cm in diameter had shrunk down to <1 cm in diameter.  None of her nodules look suspicious or met criteria for biopsy.    2. The patient remained euthyroid through the spring of 2010. In September of 2010 and again in April of 2011 she became mildly hypothyroid. It appeared that she was having some flare ups of Hashimoto's disease at those times. Her TFTs subsequently normalized on 11/26/10.  B. Because of her known osteoporosis, I wanted to treat her with a bisphosphonate. Since she had previously not tolerated Actonel well, she was reluctant. I finally convinced her to try Boniva, but she later discontinued that medication as well. She was not at all interested in doing Forteo injections. To further try to minimize bone loss, I monitored her bone mineral parameters closely. Up through her visit on 04/17/07 her parameters were normal, but she had many difficulties in taking all of her calcium and vitamin D pills, in part due to constipation. Unfortunately, by 12/03/07 her 25-hydroxy vitamin D dropped to 24.2 and her 1,25- dihydroxy vitamin D dropped to 23.3. It appeared not only was she deficient in vitamin D intake, but her kidneys were also not optimally converting 25-hydroxy vitamin D to 1, 25-dihydroxy hydroxy vitamin D. I prescribed vitamin D, 50,000 IU per week. I also added calcitriol, 0.25 mcg per week. When she took these medications, her bone parameters normalized. In early 2010, however, she had stopped taking these medications. Her PTH rose to 75 and then to 81.8. When she again took her calcium and her two vitamin D preparations, her PTH decreased progressively to 62.1 and then to 33 on 11/26/10. On that date, her serum calcium was 10.2, her 25-hydroxy vitamin D was 32.2, and her 1, 25- dihydroxy vitamin D was 28.2. By then, however, she had stopped taking the vitamin D 50,000 international units weekly because her insurance company would not cover it. Her calcium levels, vitamin D levels, and PTH levels have fluctuated ever since, usually varying with her  intakes of calcium, vitamin D, and calcitriol. Due to concerns about hypercalcemia, her new PCP, Dr. Reesa Chew, stopped her calcitriol and vitamin D. I later had her re-start the vitamin D.  C. 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  D. In late 2014 she developed cancer of the ascending colon and was successfully treated with surgery.   E. Prior to her 04/11/19 visit with me, her new PCP, Dr. Reesa Chew, had discontinued her calcitriol, 0.25 mcg, twice weekly; Citracal with D twice daily, metoprolol, HCTZ, Montelukast, and vitamin D, 50,000 IU weekly; and B12.  Rosalin Hawking says that her  Ms Goheen had a colonoscopy in April 2021 that was normal. She will not need to repeat this procedure.    3. The patient's last [facility] visit was on 06/26/20. At that visit we held her calcitriol and calcium, but continued her vitamin D supplementation, 50,000 IU every other week.   A. In the interim,  she has been fairly healthy. She has had two Moderna vaccine doses, but has not yet had the booster. She had a brief gastroenteritis episode in April 2022.  B. She continues to walk with a wide-based gait. She has fallen once outside after stumbling and falling. She stumbles on her own toes.   C. She no longer has some itching discomfort in her upper right back where she had shingles in March 2020.   D. Both Ms Rehmann and Joy Daniels say that Ms Yaun has had some memory difficulties, but she still has all her marbles. Her distant memory is intact. Her personality, social skills, and intelligence are so good, however, that she appears to be quite normal. Joy Daniels feels that she forgets some things, but not others.  Joy Daniels feels that mom remembers what she wants to remember. Ms. Moroni sense of time is not so  good. She often forgets doctors' appointments and taking medications at night. She also gets distracted easily.  Chiquita Loth is trying to help her mother do what she needs to do, but not always successfully. Mom still sometimes gets angry with Joy Daniels.   F. Ms Vogel continues to have urinary incontinence at times, but not as bad. Topiaz no longer helps. She has also tried other medications without success.   Angela Nevin thinks that her edema is greater due to salt in the foods she eats. Ms Roper says she is trying to cut back on her salt intake.    H. She shows no signs of recurrence of her mantle cell lymphoma or her colon cancer. She had her last oncology follow up in October 2021.  I. Her energy is better. She attributes the increased energy to the fact that she is eating more. She tends to sit a lot and not get up. She naps several times during the day, then often wakes up during the night. She stays cold all the time. She is not walking much. She has a walker, but often does not remember to use it.     J. She has not had many attacks of shortness of breath recently. She says that her COPD, which connie doubts, is pretty good. She still sometimes becomes somewhat short of breath if she is too active. She has not needed to use either her inhaler or her nebulizer.    K. 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   L. She still takes omeprazole 40, once daily; KCl ER 10 mEq, twice daily; losartan 25 mg/day; ezetimibe, 10 mg/day; and vitamin B6 daily. At my request, she has  resumed vitamin D, 50,000 IU every other week.   M. Food tastes better. She is eating more and gaining weight.   N. Ms Mattice says that her hair is growing out more, but is harder to manage.  4. Pertinent Review of Systems:  Constitutional: Ms. Kueker has a cane and a walker that she uses when she goes out, but not often at home.   Eyes: Vision is better since  having bilateral cataractectomies, but the vision is not as good in her right eye. She had an eye appointment in September 2021. No changes were made in her glasses. She does not wear the glasses often. The ophthalmologist recommended eye drops, but she doesn't use them.  Mouth: Mouth is often dry.    Neck: She occasionally feels sticking sensation in her left throat, usually when she has nasal congestion. She has  not had any thyroid pains or inflammation. She has had more left postero-lateral neck pains since her fall. Her range of motion of the cervical spine is much reduced. She no longer has difficulty swallowing unless her mouth gets too dry.    Heart: She is not aware of any palpitations, chest pain, or chest pressure.  Lungs: Her COPD is probably unchanged. She still gets short of breath with minor exertion.  Gastrointestinal: She is constipated sometimes, but less so. She no longer takes Miralax and Benefiber proactively, but only takes these fiber supplements now when she feels constipated. She has no complaints of excessive hunger, acid reflux, upset stomach, stomach aches or pains. Hand: No problems Legs: She has not had many calf cramps at night since her last visit.  Muscle mass and strength seem normal. There are no complaints of numbness, tingling, or burning. She has had some edema in the right leg where she had her blood clot. Feet: There are no obvious foot problems. She has occasional edema.  Neuro: Her balance is still not very good.    PAST MEDICAL, FAMILY, AND SOCIAL HISTORY:  Past Medical History:  Diagnosis Date   Combined hyperlipidemia    COPD (chronic obstructive pulmonary disease) (HCC)    Dysgeusia    GERD (gastroesophageal reflux disease)    Hypercalcemia    Hyperparathyroidism , secondary, non-renal (HCC)    Hypertension    Hypothyroidism, acquired, autoimmune    Iron deficiency    Lymphoma, mantle cell, multiple sites ([place])    Nontoxic multinodular goiter     Obesity (BMI 30.0-34.9)    Other osteoporosis    Pallor    Thyroiditis, autoimmune    Toxic nodular goiter    Vitamin D deficiency disease     Family History  Problem Relation Age of Onset   Diabetes Father    Cancer Sister    Heart disease Sister      Current Outpatient Medications:    aspirin 81 MG chewable tablet, Chew by mouth daily., Disp: , Rfl:    calcitRIOL (ROCALTROL) 0.25 MCG capsule, TAKE 1 CAPSULE TWICE WEEKLY, Disp: 25 capsule, Rfl: 3   ezetimibe (ZETIA) 10 MG tablet, Take 10 mg by mouth daily., Disp: , Rfl:    fesoterodine (TOVIAZ) 4 MG TB24 tablet, Take 4 mg by mouth daily., Disp: , Rfl:    gabapentin (NEURONTIN) 300 MG capsule, Take 300 mg by mouth 2 (two) times daily., Disp: , Rfl:    loratadine (CLARITIN) 10 MG tablet, Take 10 mg by mouth daily., Disp: , Rfl:    losartan (COZAAR) 100 MG tablet, , Disp: , Rfl:    metoprolol tartrate (LOPRESSOR) 50 MG tablet, Take 50 mg by mouth daily., Disp: , Rfl: 0   omeprazole (PRILOSEC) 40 MG capsule, , Disp: , Rfl:    potassium chloride (K-DUR,KLOR-CON) 10 MEQ tablet, , Disp: , Rfl:    potassium citrate (UROCIT-K) 10 MEQ (1080 MG) SR tablet, , Disp: , Rfl:    pravastatin (PRAVACHOL) 40 MG tablet, , Disp: , Rfl:    pyridoxine (B-6) 100 MG tablet, Take 100 mg by mouth daily., Disp: , Rfl:    Vitamin D, Ergocalciferol, (DRISDOL) 1.25 MG (50000 UNIT) CAPS capsule, TAKE 1 CAPSULE BY MOUTH EVERY OTHER WEEK, Disp: 13 capsule, Rfl: 1  Allergies as of 04/07/2021 - Review Complete 04/07/2021  Allergen Reaction Noted   Codeine  01/27/2011   Latex  07/10/2018    1. Work and Family: Her family is supportive. Izora Gala  lives with her husband, daughter Joy Daniels, and three adult grandsons.    2. Activities: She is trying to walk in the house. 3. Smoking, alcohol, or drugs: None 4. Primary Care Provider: Dr. Reesa Chew, [facility], phone [phone], fax [phone]  REVIEW OF SYSTEMS: There are no other significant problems involving  Aune's other body systems.   Objective:  Vital Signs:  BP 130/80 (BP Location: Right Arm, Patient Position: Sitting, Cuff Size: Normal)   Pulse 64   Wt 143 lb 9.6 oz (65.1 kg)   BMI 28.04 kg/m     Ht Readings from Last 3 Encounters:  07/10/18 5' (1.524 m)  01/08/18 5' (1.524 m)  10/12/10 5' (1.524 m)   Wt Readings from Last 3 Encounters:  04/07/21 143 lb 9.6 oz (65.1 kg)  07/06/20 145 lb 3.2 oz (65.9 kg)  03/05/20 146 lb (66.2 kg)   PHYSICAL EXAM:   Yaslin looks pretty good overall today. She is alert and bright. Her sense of humor is good. Her dementia is very mild and subtle today. She sometimes defers to Brea when I ask questions about her medications or visits to other providers She has lost 2 pounds of a pound since her last visit.  Eyes: There is no arcus or proptosis.  Mouth: The oropharynx appears normal. The tongue appears normal. There is normal oral moisture. There is no obvious gingivitis. Neck: There are no bruits present. The thyroid gland appears normal in size. The thyroid gland is normal at approximately 20 grams in size. The consistency of the thyroid gland is normal. There is no thyroid tenderness to palpation today, but she says that when she has anterior neck pains they come at the same point where I am palpating her right lobe of the thyroid gland.  Lungs: The lungs are clear. Air movement is good. Heart: The heart rhythm and rate appear normal. Heart sounds S1 and S2 are normal. I do not appreciate any pathologic heart murmurs. Abdomen: The abdominal size is mildly enlarged. Bowel sounds are normal. The abdomen is soft and non-tender. There is no obviously palpable hepatomegaly, splenomegaly, or other masses.  Arms: Muscle mass appears appropriate for age. Hands: There is a trace-to-1+ hand tremor. Phalangeal and metacarpophalangeal joints appear normal. Palms are normal. Legs: Muscle mass appears appropriate for age. There is trace-to-1+ edema of the left  shin.   Neurologic: Muscle strength is normal for age and gender in both the upper and the lower extremities. Muscle tone appears normal. Sensation to touch is normal in the legs.  LAB DATA:   Labs 07/06/20: TSH 3.18, free T4 1.1, free T3 2.4; PTH 118, calcium 10.9, 25-OH vitamin D 86  Labs 03/05/20: TSH 3.23, free T4 1.1, free T3 2.5; PTH 76 (ref 14-64), calcium 10.8 (ref 8.6-10.4); calcitriol 65 (ref 18-72), 25-OH vitamin D 111 (ref 30-100)  Labs 07/24/19: TSH 3.89, free T4 1.1, free T3 2.7; calcium 10.7 (ref 8.6-10.4), PTH 82 (ref 14-64), calcitriol 43 (ref 18-72),  25-OH vitamin D 55  Labs 12/28/18: PTH 66 (ref 15-65), calcium 11.3 (ref 8.7-10.3), 25-OH vitamin D 39.6 (ref 30-100); fasting glucose 100; creatinine 0.94, CO2 30 (ref 20-29); uric acid 6.4 (ref 2.5-7.1)  Labs 07/10/18: PTH 120 (ref 14-64), calcium 10.8 (ref 8.6-10.4), 25-OH vitamin D 51  Labs 02/23/18: PTH 65, calcium 10.9, 1,25 vitamin D 45 (ref 18-72)   Labs 01/08/18: TSH 2.08, free T4 1.1, free T3 2.2; PTH 68, calcium 10.9, 25-OH vitamin D 37  Labs 07/10/17: TSH 1.96,  free T4 1.0, free T3 2.6; CMP normal, except calcium 10.5; 25-OH vitamin D 46; PTH 74 (14-64), vitamin B12 403 (ref 200-1,100), vitamin B6 18.9 (ref 2.1-21.7)  Labs 01/05/17: PTH 71 (ref 14-64), calcium 10.3, 25-OH vitamin D 58, 1,25-dihydroxy vitamin D 56 (ref 18-72)  Labs 10/04/16: Surgical pathology: Peripheral blood flow cytometry: Relative larger amount of T cells than B cells and reversal of the CD4:CD8 ratio  Labs 06/24/16: TSH 2.36, free T4 0,5, free T3 2.5; CMP normal, except for bilirubin 1.6 and ALT 40; 25-OH vitamin D 42  Labs 12/22/15: TSH 2.66, free T4 1.2, free T3 2.4; CMP normal except for AST 39 and ALT 48; PTH 79 (ref 14-64), calcium 10.0,  25-OH vitamin D 47, 1,25-dihydroxy vitamin d 40 (ref 18.72)  Labs: 11/18/14: 25-OH vitamin D 32.9, calcium 10.1; ALT 42; cholesterol 237, triglycerides 290, HDL 46, LDL 133  Labs 08/14/14: 25-OH vitamin D  31.9, calcium 10.6; ALT 33; TSH 3.47   Labs 11/05/13: TSH 2.902, free T4 1.06, free T3 2.5; 1,25-dihydroxy vitamin D 39; 25-hydroxy vitamin D 47, PTH 72, calcium 10.1  Labs 06/11/13: 1,25-dihydroxy vitamin D 39, 25-hydroxy vitamin D 47, PTH 72, calcium 10.1 ; TSH 2.901, free T4 1.06, free T3  2.5  Labs 12/10/12: Hgb 11.1, Hct 33.6%; TSH 2.964, free T4 1.06, free T3 2.4; 25-hydroxy vitamin D 37, PTH 89.4, calcium 9.9; CMP normal  Labs 06/11/12: CMP was normal, to include a calcium of 9.9; TSH 2.195,  free T4 0.98, free T3 2.6; PTH 102.3, 25-hydroxy vitamin D 29, 1,25-dihydroxy vitamin D 57.  Labs 11/17/11: PTH 70.9,calcium 10.0, 25-hydroxy vitamin D 40, 1,25-dihydroxy vitamin D 67, TSH 2.312. Free T4  0.97, Free T3 2.4  Labs 05/19/11: PTH 64, calcium 10.4, 25-hydroxy vitamin D 36, 1,25-dihydroxy vitamin D 47, TSH 2.497, free T4 1.04, free T3 2.6  IMAGING:  Thyroid US 07/31/20: Her thyroid gland was heterogenous. She had three sub-centimeter nodules, two in the right inferior pole and one in the left mid-lobe. No nodule met the criteria for biopsy or follow up.  Thyroid US 01/25/18: 0.7 cm peripheral calcified nodule, right inferior; 0.8 cm inferior right nodule with coarse calcifications, previously 1.8 cm, no progression for greater than 5 years; 0.7 cm peripherally calcified cyst in superior left lobe, stable for greater than 5 years; 0.8 cm hyperechoic nodules in mid left lobe, previously 1.6 cm, stable for greater than 5 years. No nodule met criteria for biopsy.    Assessment and Plan:   ASSESSMENT:  1-3. Obesity/unintentional weight loss/dysgeusia:   A. Ms Meine says that food tastes better, Joy Daniels feels that her appetite has improved since beginning vitamin B6 therapy. Her tongue looks normal, so she does not have any obvious glossitis.   B. She has recently lost almost 2 pounds in weight.  4. Vitamin D deficiency:   A. The patient's 25-hydroxy vitamin D levels in April 2017, October 2017,  April 2018, October 2018, April 2019, October 2019, and April 2020 were normal. Her 25-OH D level in June 2020 was too high, so I asked her to reduce the dosage to every other week.   B. Her 1,25-dihydroxy vitamin D (calcitriol) levels in April 2017, April 2018, June 2019, November 2020, June 2021, and November 2021 were normal.   C. The combination of oral vitamin D and oral calcitriol that she was taking in the past had worked for her.  She had previously been taken off all vitamin D and calcium due to having a  more elevated calcium level, but later resumed weekly vitamin D. Her vitamin d level in October 2021 was good.  5-6. Thyroiditis/multinodular goiter:   A. Her Hashimoto's disease was clinically quiescent at her last 4 visits and is quiescent today. Her thyroid gland was again within normal limits for size at her last 4 visits and again today. The episodic "tenderness" that she has had previously and the process of waxing and waning of thyroid gland size are c/w thyroiditis.    B. She was euthyroid again in April and October 2017, in October 2018, in April 2019. Her TFTs in November 2020 were in the low-normal range. Her TFTs in June 2021 were also in the low-normal range.   C. Her thyroid US in May 2019 showed several unremarkable small nodules. Her [place] in November 2021 was similar.  7. Dyspepsia: She is doing better on omeprazole. 8-9. Hyperparathyroidism, secondary to calcium and vitamin D intake deficiency/hypercalcemia:   A. Her PTH had been mildly elevated for several years, but her calciums had been in the upper quartile of the normal range or slightly elevated.   B. Her calcium in October 2018 was at the upper end of the normal range. Her calcium levels in April, June, and October 2019 were mildly elevated. Her PTH in October was very high. It appeared at that time that she might be slowly converting from secondary hyperparathyroidism to tertiary hyperparathyroidism.   C. In April 2020 her  PTH decreased to just above the upper limit of normal, but her calcium was much higher. In November 2020 her calcium was lower, but still elevated. Her PTH had decreased. In June 2021 her calcium increased a bit and her PTH decreased a bit, again showing a physiologic pattern. These findings suggest that her parathyroid glands will respond to her ambient calcium level, although imperfectly. In November 2021, however, her PTH was much higher and her calcium had increases a bit.   D. While I understood why her PCP stopped the calcium, vitamin D, and calcitriol medications, I suspect that these changes will cause her secondary/tertiary hyperparathyroidism to worsen.  If so, it will be important to keep her calcium levels relatively high and her vitamin D levels normal in order to control the PTH. We need to find a calcium/vitamin D/calcitriol regimen that controls the PTH without causing too high a calcium level. If we and she are lucky, the calcium levels will not increase too much and she will not need parathyroidectomy.   E. Depending upon what her recent lab results are, we may need to perform additional lab studies and perhaps a sestamibi scan.   10. Fatigue, other: Patient's fatigue is still present, which I suspect is due in part to her chemotherapy and to her low activity level. She is very deconditioned. She needs to walk more.   11. Lymphoma: Her lymphoma is in remission, which is wonderful.  12. Colon cancer: Her colon cancer is also in remission, which is also wonderful.  13. Calf cramps and restless legs:  These problems do not occur very often anymore. I  again  recommended that she stretch the calves before going to bed.  14. Memory problem: She appears to be doing pretty well overall, but needs ongoing support from [place].   15. Combined hyperlipidemia: Dr. Reesa Chew is following this problem for her.  16. Pallor: She will need a CBC and CMP when she sees her oncologist. 17. Hair loss: Her scalp is  somewhat thin, but there has been  any real change at her last several visits. I have talked with her about Rogaine for Women in the past, but she declined. She says today that her hair is unchanged. Joy Daniels concurred.    PLAN:  1. Diagnostic: Repeat TFTs, calcium, PTH, 25-OH vitamin D now. If needed, repeat US of the thyroid gland to look for a parathyroid adenoma. We many also need a sestamibi scan.   2. Therapeutic: Continue to hold calcitriol and calcium to see if the calcium level decreases without causing too much of an increase in PTH. She may need to reduce her chocolate milk and yogurt intake. Continue vitamin B12 and continue vitamin D for now. Continue to take a multivitamin pill and one vitamin B6 capsule daily 3. 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 We need to keep up her intake of calcium and vitamin D to the extent that we can, but we also need to control the PTH as well as we can. Unfortunately, if her PTH function worsens she might need parathyroid surgery. 4. Follow-up: 3 months  Level of Service: This visit lasted in excess of 60 minutes. More than 50% of the visit was devoted to counseling.  Tillman Sers, MD, CDE Adult and Pediatric Endocrinology 04/07/2021 10:02 AM   ADDENDUM 04/16/2021:  Labs 04/07/21:  TSH 5.28 (ref 0.40-4.50), free T4 1.1, free T3 2.9 PTH 116 (ref 16-77)m calcium 10.9 (ref 8.6-10.4), 25-OH vitamin D 87 (ref 30-100)  Assessment:  TSH is mildly elevated, but free T4 is unchanged and free T3 is increased. She may have developed acquired hypothyroidism, 16 years after her I131 therapy for TNG. We need to repeat her TFTs in two months. PTH and calcium are again elevated. She may have a parathyroid tumor or parathyroid hyperplasia. We need to perform a sestamibi scan.  Plan: Order TFTS to be done in 2 months.  Order a sestamibi scan. Tillman Sers, MD,  CDE

## 2021-04-07 ENCOUNTER — Encounter (INDEPENDENT_AMBULATORY_CARE_PROVIDER_SITE_OTHER): Payer: Self-pay | Admitting: "Endocrinology

## 2021-04-07 ENCOUNTER — Ambulatory Visit (INDEPENDENT_AMBULATORY_CARE_PROVIDER_SITE_OTHER): Payer: Medicare PPO | Admitting: "Endocrinology

## 2021-04-07 ENCOUNTER — Other Ambulatory Visit: Payer: Self-pay

## 2021-04-07 DIAGNOSIS — E211 Secondary hyperparathyroidism, not elsewhere classified: Secondary | ICD-10-CM | POA: Diagnosis not present

## 2021-04-07 DIAGNOSIS — E042 Nontoxic multinodular goiter: Secondary | ICD-10-CM | POA: Diagnosis not present

## 2021-04-07 DIAGNOSIS — E063 Autoimmune thyroiditis: Secondary | ICD-10-CM

## 2021-04-07 DIAGNOSIS — I10 Essential (primary) hypertension: Secondary | ICD-10-CM | POA: Diagnosis not present

## 2021-04-07 DIAGNOSIS — R432 Parageusia: Secondary | ICD-10-CM

## 2021-04-07 DIAGNOSIS — R5383 Other fatigue: Secondary | ICD-10-CM

## 2021-04-07 DIAGNOSIS — E559 Vitamin D deficiency, unspecified: Secondary | ICD-10-CM | POA: Diagnosis not present

## 2021-04-07 NOTE — Patient Instructions (Signed)
Follow up visit in 3 months. 

## 2021-04-08 LAB — VITAMIN D 25 HYDROXY (VIT D DEFICIENCY, FRACTURES): Vit D, 25-Hydroxy: 87 ng/mL (ref 30–100)

## 2021-04-08 LAB — TSH: TSH: 5.28 mIU/L — ABNORMAL HIGH (ref 0.40–4.50)

## 2021-04-08 LAB — PTH, INTACT AND CALCIUM
Calcium: 10.9 mg/dL — ABNORMAL HIGH (ref 8.6–10.4)
PTH: 116 pg/mL — ABNORMAL HIGH (ref 16–77)

## 2021-04-08 LAB — T4, FREE: Free T4: 1.1 ng/dL (ref 0.8–1.8)

## 2021-04-08 LAB — T3, FREE: T3, Free: 2.9 pg/mL (ref 2.3–4.2)

## 2021-04-20 ENCOUNTER — Telehealth (INDEPENDENT_AMBULATORY_CARE_PROVIDER_SITE_OTHER): Payer: Self-pay | Admitting: "Endocrinology

## 2021-04-20 ENCOUNTER — Other Ambulatory Visit: Payer: Self-pay | Admitting: "Endocrinology

## 2021-04-20 ENCOUNTER — Encounter (INDEPENDENT_AMBULATORY_CARE_PROVIDER_SITE_OTHER): Payer: Self-pay | Admitting: *Deleted

## 2021-04-20 DIAGNOSIS — E213 Hyperparathyroidism, unspecified: Secondary | ICD-10-CM

## 2021-04-20 NOTE — Telephone Encounter (Signed)
I received a message from Dr. Kerby Moors, nuclear medicine specialist in [place], that the best test for Ms Randol now would be a sestamibi spect-CT that can only be performed at [facility].  I called nuclear medicine at [facility], [phone] and spoke with Butch Penny. She helped me put in the order and scheduled the test for 11:30 AM on Monday, 04/26/21. Patient needs to arrive at 11:00 AM for processing. Nuc med staff will call the family on 04/24/21 to go over the details of the test.  I called Ms. Veach's home and spoke with her daughter, [name]. I discussed all of the above information with [name]. She will relay the info to her mother.  Tillman Sers, MD, CDE

## 2021-04-26 ENCOUNTER — Other Ambulatory Visit: Payer: Medicare PPO

## 2021-04-29 ENCOUNTER — Other Ambulatory Visit: Payer: Medicare PPO

## 2021-05-04 ENCOUNTER — Other Ambulatory Visit: Payer: Self-pay

## 2021-05-04 ENCOUNTER — Encounter
Admission: RE | Admit: 2021-05-04 | Discharge: 2021-05-04 | Disposition: A | Discharge status: Home / Self Care | Payer: Medicare PPO | Source: Ambulatory Visit | Attending: "Endocrinology | Admitting: "Endocrinology

## 2021-05-04 DIAGNOSIS — I7090 Unspecified atherosclerosis: Secondary | ICD-10-CM | POA: Diagnosis not present

## 2021-05-04 DIAGNOSIS — E213 Hyperparathyroidism, unspecified: Secondary | ICD-10-CM | POA: Diagnosis not present

## 2021-05-04 MED ORDER — TECHNETIUM TC 99M SESTAMIBI - CARDIOLITE
25.0000 | Freq: Once | INTRAVENOUS | Status: AC | PRN
  Administered 2021-05-04: 11:00:00 25.58 via INTRAVENOUS

## 2021-05-09 ENCOUNTER — Other Ambulatory Visit (INDEPENDENT_AMBULATORY_CARE_PROVIDER_SITE_OTHER): Payer: Self-pay | Admitting: "Endocrinology

## 2021-05-09 DIAGNOSIS — E559 Vitamin D deficiency, unspecified: Secondary | ICD-10-CM

## 2021-05-13 NOTE — Telephone Encounter (Signed)
I tried to call the patient, but the call was involuntarily aborted due to loss of contact with the server.  Tillman Sers, MD

## 2021-05-25 ENCOUNTER — Telehealth (INDEPENDENT_AMBULATORY_CARE_PROVIDER_SITE_OTHER): Payer: Self-pay | Admitting: "Endocrinology

## 2021-05-25 DIAGNOSIS — E559 Vitamin D deficiency, unspecified: Secondary | ICD-10-CM

## 2021-05-25 NOTE — Telephone Encounter (Signed)
  Who's calling (name and relationship to patient) :childers,connie [facility])  Best contact number: [phone] (Home) Provider they see: Sherrlyn Hock, MD Reason for call:  Daughter called to get result of last test read to herself and patient. Currently working on getting the patient mychart access as well.    PRESCRIPTION REFILL ONLY  Name of prescription:  Pharmacy:

## 2021-05-26 NOTE — Telephone Encounter (Signed)
Attempted to return phone call, left HIPAA approved voicemail for return phone call.  

## 2021-05-26 NOTE — Telephone Encounter (Signed)
Returned call to daughter, per daughter patient had scan done it has been 3 weeks and they have not heard anything yet about the results.  She also stated that the Vit D has run out and they are unable to get it refilled at the pharmacy.  I told her I would let Dr. Tobe Sos know.  I also told her that he is out of the office until Tuesday.

## 2021-05-26 NOTE — Telephone Encounter (Signed)
Daughter called back and will wait for call back

## 2021-06-01 ENCOUNTER — Telehealth (INDEPENDENT_AMBULATORY_CARE_PROVIDER_SITE_OTHER): Payer: Self-pay | Admitting: "Endocrinology

## 2021-06-01 MED ORDER — VITAMIN D (ERGOCALCIFEROL) 1.25 MG (50000 UNIT) PO CAPS: ORAL_CAPSULE | ORAL | 1 refills | Status: DC

## 2021-06-01 NOTE — Telephone Encounter (Signed)
Vit D refill sent

## 2021-06-01 NOTE — Telephone Encounter (Signed)
Sent Dr. Tobe Sos secure chat

## 2021-06-01 NOTE — Telephone Encounter (Signed)
I called Joy Daniels to discuss the results of her sestamibi scan. The scan showed an accumulation of tracer in the left upper lobe of the thyroid gland that is c/w a parathyroid adenoma. This finding would explain her increasing PTH and calcium levels. This finding also corresponds with the 9 x 5 mm nodule seen on her CT study. I told Joy Daniels that I have contacted the office of Dr. Armandina Gemma, MD, a very respected and competent surgeon here in [place]. His office will call her to set up an appointment. She said someone called her, but offered an appointment on 05/25/21, which is already past.  Her daughter, [name], will call me tonight to discuss the issue.  Tillman Sers. MD, CDE

## 2021-06-07 DIAGNOSIS — E782 Mixed hyperlipidemia: Secondary | ICD-10-CM | POA: Diagnosis not present

## 2021-06-07 DIAGNOSIS — I10 Essential (primary) hypertension: Secondary | ICD-10-CM | POA: Diagnosis not present

## 2021-06-07 DIAGNOSIS — R634 Abnormal weight loss: Secondary | ICD-10-CM | POA: Diagnosis not present

## 2021-06-07 DIAGNOSIS — Z23 Encounter for immunization: Secondary | ICD-10-CM | POA: Diagnosis not present

## 2021-06-12 DIAGNOSIS — I77810 Thoracic aortic ectasia: Secondary | ICD-10-CM | POA: Diagnosis not present

## 2021-06-12 DIAGNOSIS — R918 Other nonspecific abnormal finding of lung field: Secondary | ICD-10-CM | POA: Diagnosis not present

## 2021-06-12 DIAGNOSIS — R1011 Right upper quadrant pain: Secondary | ICD-10-CM | POA: Diagnosis not present

## 2021-06-12 DIAGNOSIS — N170 Acute kidney failure with tubular necrosis: Secondary | ICD-10-CM | POA: Diagnosis not present

## 2021-06-12 DIAGNOSIS — R911 Solitary pulmonary nodule: Secondary | ICD-10-CM | POA: Diagnosis not present

## 2021-06-12 DIAGNOSIS — N179 Acute kidney failure, unspecified: Secondary | ICD-10-CM | POA: Diagnosis not present

## 2021-06-12 DIAGNOSIS — R1031 Right lower quadrant pain: Secondary | ICD-10-CM | POA: Diagnosis not present

## 2021-06-12 DIAGNOSIS — I700 Atherosclerosis of aorta: Secondary | ICD-10-CM | POA: Diagnosis not present

## 2021-06-12 DIAGNOSIS — N281 Cyst of kidney, acquired: Secondary | ICD-10-CM | POA: Diagnosis not present

## 2021-06-12 DIAGNOSIS — C8310 Mantle cell lymphoma, unspecified site: Secondary | ICD-10-CM | POA: Diagnosis not present

## 2021-06-12 DIAGNOSIS — J90 Pleural effusion, not elsewhere classified: Secondary | ICD-10-CM | POA: Diagnosis not present

## 2021-06-12 DIAGNOSIS — K449 Diaphragmatic hernia without obstruction or gangrene: Secondary | ICD-10-CM | POA: Diagnosis not present

## 2021-06-13 DIAGNOSIS — N170 Acute kidney failure with tubular necrosis: Secondary | ICD-10-CM | POA: Diagnosis not present

## 2021-06-13 DIAGNOSIS — C8310 Mantle cell lymphoma, unspecified site: Secondary | ICD-10-CM | POA: Diagnosis not present

## 2021-06-14 DIAGNOSIS — N170 Acute kidney failure with tubular necrosis: Secondary | ICD-10-CM | POA: Diagnosis not present

## 2021-06-14 DIAGNOSIS — J90 Pleural effusion, not elsewhere classified: Secondary | ICD-10-CM | POA: Diagnosis not present

## 2021-06-14 DIAGNOSIS — C8310 Mantle cell lymphoma, unspecified site: Secondary | ICD-10-CM | POA: Diagnosis not present

## 2021-06-15 DIAGNOSIS — N170 Acute kidney failure with tubular necrosis: Secondary | ICD-10-CM | POA: Diagnosis not present

## 2021-06-15 DIAGNOSIS — C8310 Mantle cell lymphoma, unspecified site: Secondary | ICD-10-CM | POA: Diagnosis not present

## 2021-06-17 ENCOUNTER — Other Ambulatory Visit: Payer: Self-pay

## 2021-06-17 NOTE — Patient Outreach (Signed)
[facility]) Care Management  06/17/2021  Joy Daniels [birth year] [id number]     Transition of Care Referral  Referral Date: 06/16/2021  Referral Source: Discharge Report Date of Discharge: 06/15/2021 Facility: [facility]     Unsuccessful outreach attempt # 1 to patient-busy signal after several attempts.     Plan: RN CM will make outreach attempt to patient within 3-4 business days.  RN CM will send unsuccessful outreach letter to patient.    Enzo Montgomery, RN,BSN,CCM [facility] Telephonic Care Management Coordinator Direct Phone: [phone] Toll Free: [phone] Fax: [phone]

## 2021-06-18 ENCOUNTER — Other Ambulatory Visit: Payer: Self-pay

## 2021-06-18 NOTE — Patient Outreach (Signed)
[facility]) Care Management  06/18/2021  Joy Daniels [birth year] [id number]   Transition of Care Referral   Referral Date: 06/16/2021  Referral Source: Discharge Report Date of Discharge: 06/15/2021 Facility: [facility] Initial Assessment  Outreach attempt #2 to patient. Spoke with both spouse and patient. She reports she is doing fairly well since discharge home. Patient resides in her home with spouse. Daughter lives nearby and assists them as needed. She is independent with ADLs/IADLs except she no longer drives. She uses cane to aide in ambulation.    Conditions:Per chart review, patient has PMH that includes but not limited to hyperparathyroidism, thyroid nodules, thyroiditis, hypercalcemia, iron deficiency anemia, Vit D deficiency, Colon CA (2014), mantle cell lymphoma, osteoporosis, COPD, GERD and urinary incontinence. Patient with recent hospital admission from 10/01-10/04/22 for thyroid related issues.    Medications: Patient reports hat daughter assists her with med mgmt.   Medications Reviewed Today     Reviewed by Hayden Pedro, RN (Registered Nurse) on 06/18/21 at [facility] Status: <None>   Medication Order Taking? Sig Documenting Provider Last Dose Status Informant  aspirin 81 MG chewable tablet [id number] No Chew by mouth daily. [provider] Taking Active Family Member  calcitRIOL (ROCALTROL) 0.25 MCG capsule [id number] No TAKE 1 CAPSULE TWICE WEEKLY Sherrlyn Hock, MD Taking Active   ezetimibe (ZETIA) 10 MG tablet [id number] No Take 10 mg by mouth daily. [provider] Taking Active Family Member  fesoterodine (TOVIAZ) 4 MG TB24 tablet [id number] No Take 4 mg by mouth daily. [provider] Taking Active   gabapentin (NEURONTIN) 300 MG capsule [id number] No Take 300 mg by mouth 2 (two) times daily. [provider] Taking Active   loratadine (CLARITIN) 10 MG tablet [id number] No Take 10  mg by mouth daily. [provider] Taking Active   losartan (COZAAR) 100 MG tablet [id number] No  [provider] Taking Active   metoprolol tartrate (LOPRESSOR) 50 MG tablet [id number] No Take 50 mg by mouth daily. [provider] Taking Active            Med Note Jerline Pain, TIFFANY N   Thu Mar 05, 2020 10:43 AM) 100 mg  omeprazole (PRILOSEC) 40 MG capsule [id number] No  [provider] Taking Active   potassium chloride (K-DUR,KLOR-CON) 10 MEQ tablet [id number] No  [provider] Taking Active   potassium citrate (UROCIT-K) 10 MEQ (1080 MG) SR tablet [id number] No  [provider] Taking Active   pravastatin (PRAVACHOL) 40 MG tablet [id number] No  [provider] Taking Active   pyridoxine (B-6) 100 MG tablet [id number] No Take 100 mg by mouth daily. [provider] Taking Active   Vitamin D, Ergocalciferol, (DRISDOL) 1.25 MG (50000 UNIT) CAPS capsule [id number]  TAKE 1 CAPSULE BY MOUTH EVERY OTHER WEEK Sherrlyn Hock, MD  Active             Fall Risk  06/18/2021  Falls in the past year? 1  Number falls in past yr: 0  Injury with Fall? 1  Risk for fall due to : History of fall(s);Medication side effect;Impaired balance/gait  Follow up Falls evaluation completed;Education provided    Depression screen PHQ 2/9 06/18/2021  Decreased Interest 0  Down, Depressed, Hopeless 0  PHQ - 2 Score 0    SDOH Screenings   Alcohol Screen: Not on file  Depression (PHQ2-9): Low Risk    PHQ-2 Score: 0  Financial Resource Strain: Not on file  Food Insecurity: No Food Insecurity   Worried About Charity fundraiser in the Last Year: Never true   Ran Out of Food in the Last Year: Never true  Housing: Not on file  Physical Activity: Not on file  Social Connections: Not on file  Stress: Not on file  Tobacco Use: Low Risk    Smoking Tobacco Use: Never   Smokeless Tobacco Use: Never  Transportation Needs: No Transportation Needs    Lack of Transportation (Medical): No   Lack of Transportation (Non-Medical): No     Goals Addressed               This Visit's Progress     (THN)Anemia Prevented or Managed (pt-stated)           Timeframe:  Short-Term Goal Priority:  High Start Date: 06/18/2021                            Expected End Date: Nov 2022 Follow Up Date: Nov 2022   Barriers: Knowledge  -pt will f/u with MD regarding labs/blood work for thyroid and iron levels -pt will verbalize med adherence                      Evidence-based guidance:  Monitor efficacy of treatment plan by review of signs and symptoms, and lab studies.  Anticipate further diagnostic testing that may include screening for celiac disease, gastroscopy, colonoscopy, video capsule endoscopy, small bowel MRI (magnetic resonance imaging) or CT (computerized tomography).   Promote adherence to vitamin and/or mineral supplementation such as taking iron supplement daily 1 or 2 hours before or after meals with Vitamin C-rich juice/food and without milk.  Provide individualized medical nutrition therapy.   Promote use of iron-fortified foods, such as ready-to-eat or cooked cereals and use of iron cooking pots.  Identify ways to alleviate barriers to treatment, including food insecurity, lack of potable water, poor sanitation, side effects of vitamin and mineral supplements, lack of education, and food preferences.  Monitor for constipation; encourage increased fluid and fiber intake, and use of stool softeners or laxatives.  If patient has inadequate response to oral therapy or co-morbidities (e.g., cardiac instability; dialysis; celiac or irritable bowel disease), anticipate/prepare patient for intravenous iron.  Encourage disclosure of pica-type behaviors; if present, acknowledge and validate physiological reasons for cravings and correct nutritional deficiencies.  Promote use of behavioral strategies that may include sensory reinforcement and  training to discriminate between edible/inedible items to treat pica.  Anticipate/prepare patient for blood transfusion if symptoms are present or if hemoglobin is significantly diminished.   Notes:   06/18/21-Patient reports she has been dealing with chronic and ongoing iron and thyroid issues. She is being followed closely.       [facility] and Keep All Appointments (pt-stated)        Timeframe:  Short-Term Goal Priority:  High Start Date:  10/0072022                           Expected End Date: Nov 2022                       Follow Up Date Nov 2022  Barriers: Health Behaviors Knowledge    - ask family or friend for a ride - call to cancel if needed - keep a calendar with appointment dates    Why is this  important?   Part of staying healthy is seeing the doctor for follow-up care.  If you forget your appointments, there are some things you can do to stay on track.    Notes:  06/18/2021-Patient reported she as unsure about MD f/u appt schedule as her daughter normally manages that. She will f/u with daughter to make sure she has made appts.       (THN)Track and Manage My Symptoms-COPD (pt-stated)        Timeframe:  Long-Range Goal Priority:  High Start Date:   06/18/2021                          Expected End Date: 01/09/2022                      Follow Up Date 01/09/2022  Barriers: Health Behaviors Knowledge    - develop a rescue plan - eliminate symptom triggers at home - follow rescue plan if symptoms flare-up - keep follow-up appointments    Why is this important?   Tracking your symptoms and other information about your health helps your doctor plan your care.  Write down the symptoms, the time of day, what you were doing and what medicine you are taking.  You will soon learn how to manage your symptoms.     Notes:   06/18/21-Patient reports COPD is managed at present and she has not had many issues with it. She does have a chronic dry nonproductive cough.          Consent:  [facility] services reviewed and discussed with patient. Verbal consent for services given.    Plan: RN CM discussed with patient next outreach within 4-6wks. Patient agrees to care plan and follow up. Patient gave verbal consent and in agreement with RN CM follow up and timeframe. Patient aware that they may contact RN CM sooner for any issues or concerns. RN CM reviewed goals and plan of care with patient. RN CM will send barriers letter and route encounter to PCP. RN CM will send welcome letter to patient.  Enzo Montgomery, RN,BSN,CCM [facility] Telephonic Care Management Coordinator Direct Phone: [phone] Toll Free: [phone] Fax: [phone]

## 2021-06-24 ENCOUNTER — Ambulatory Visit: Payer: Self-pay | Admitting: Surgery

## 2021-06-24 DIAGNOSIS — R1084 Generalized abdominal pain: Secondary | ICD-10-CM | POA: Diagnosis not present

## 2021-06-24 DIAGNOSIS — E210 Primary hyperparathyroidism: Secondary | ICD-10-CM | POA: Diagnosis not present

## 2021-06-24 DIAGNOSIS — E213 Hyperparathyroidism, unspecified: Secondary | ICD-10-CM | POA: Diagnosis not present

## 2021-06-24 DIAGNOSIS — M6281 Muscle weakness (generalized): Secondary | ICD-10-CM | POA: Diagnosis not present

## 2021-06-24 DIAGNOSIS — Z923 Personal history of irradiation: Secondary | ICD-10-CM | POA: Insufficient documentation

## 2021-06-24 HISTORY — DX: Personal history of irradiation: Z92.3

## 2021-06-24 HISTORY — DX: Primary hyperparathyroidism: E21.0

## 2021-06-25 ENCOUNTER — Telehealth (INDEPENDENT_AMBULATORY_CARE_PROVIDER_SITE_OTHER): Payer: Self-pay | Admitting: "Endocrinology

## 2021-06-25 NOTE — Telephone Encounter (Signed)
Who's calling (name and relationship to patient) : Sherryll Burger  Best contact number: [phone]  Provider they see: Dr. Tobe Sos  Reason for call: Daughter would like to know if next appt is necessary givent hat they have already spoken to the surgeon who will be preforming surgery on patient  Call ID:      PRESCRIPTION REFILL ONLY  Name of prescription:  Pharmacy:

## 2021-06-27 ENCOUNTER — Telehealth (INDEPENDENT_AMBULATORY_CARE_PROVIDER_SITE_OTHER): Payer: Self-pay | Admitting: "Endocrinology

## 2021-06-27 NOTE — Telephone Encounter (Signed)
I called Ms. Joy Daniels and her daughter, Joy Daniels reference her recent surgical consult hor primary hyperparathyroidism.  She really enjoyed the appointment with Dr. Harlow Asa. He will call her with an appointment date  Joy Daniels was hospitalized a week ago for a UTI. She also had some fluid in her lungs. She has an appointment with her cancer doctor on 07/07/21. I told her we can re-schedule her appointment with me on 07/07/21 until after her parathyroid surgery.  Joy Daniels will call me with updates.  Tillman Sers, MD, CDE '

## 2021-06-30 ENCOUNTER — Other Ambulatory Visit: Payer: Self-pay | Admitting: Oncology

## 2021-06-30 DIAGNOSIS — C8318 Mantle cell lymphoma, lymph nodes of multiple sites: Secondary | ICD-10-CM

## 2021-07-01 ENCOUNTER — Ambulatory Visit: Payer: Medicare PPO | Admitting: Oncology

## 2021-07-01 ENCOUNTER — Other Ambulatory Visit: Payer: Medicare PPO

## 2021-07-06 ENCOUNTER — Telehealth (INDEPENDENT_AMBULATORY_CARE_PROVIDER_SITE_OTHER): Payer: Self-pay | Admitting: "Endocrinology

## 2021-07-06 NOTE — Telephone Encounter (Signed)
I called Ms. Lerner's home and spoke with [name], Ms. Rougeau's daughter. They do not want to keep the appointment tomorrow. Ms. Sindt has to see her oncologist tomorrow. After that appointment, Dr. Harlow Asa will decide whether or not to perform surgery. [name] will call me to keep me informed.   Tillman Sers, MD

## 2021-07-06 NOTE — Progress Notes (Signed)
[facility]  [address] [place],  [place]  [address] [phone]  Clinic Day:  07/07/2021  Referring physician: Garwin Brothers, MD   HISTORY OF PRESENT ILLNESS:  The patient is a 80 y.o. female with stage IIA (T3 N0 M0) colon cancer, status post a right hemicolectomy in December 2014.  The patient also has a history of stage IVA mantle cell lymphoma, for which she underwent 6 cycles of bendamustine/Rituxan, followed by 2 years of maintenance Rituxan, which were completed in March 2014.  She comes in today after the patient was hospitalized earlier this month for a right-sided pleural effusion.  There was also evidence of enlarging pulmonary nodules.  Unfortunately, despite having a thoracentesis done, not cytology or flow cytometry studies were done to determine if her effusion was malignant in nature.  She comes in today to discuss what next needs to be done for her evaluation.  The patient claims to have only minimal shortness of breath.  She denies having any right chest wall pain, a productive cough, or hemoptysis.  Her daughter is concerned that she has lost 15 pounds unintentionally over the past few months.   PHYSICAL EXAM:  Blood pressure (!) 171/79, pulse 83, temperature 98.5 F (36.9 C), resp. rate 14, height 5\' 1"  (1.549 m), weight 132 lb 4.8 oz (60 kg), SpO2 97 %. Wt Readings from Last 3 Encounters:  07/07/21 132 lb 4.8 oz (60 kg)  04/07/21 143 lb 9.6 oz (65.1 kg)  07/06/20 145 lb 3.2 oz (65.9 kg)   Body mass index is 25 kg/m. Performance status (ECOG): 1 - Symptomatic but completely ambulatory Physical Exam Constitutional:      Appearance: Normal appearance. She is not ill-appearing.  HENT:     Mouth/Throat:     Mouth: Mucous membranes are moist.     Pharynx: Oropharynx is clear. No oropharyngeal exudate or posterior oropharyngeal erythema.  Cardiovascular:     Rate and Rhythm: Normal rate and regular rhythm.     Heart sounds: No murmur  heard.   No friction rub. No gallop.  Pulmonary:     Effort: Pulmonary effort is normal. No respiratory distress.     Breath sounds: Normal breath sounds. No decreased breath sounds, wheezing, rhonchi or rales.  Abdominal:     General: Bowel sounds are normal. There is no distension.     Palpations: Abdomen is soft. There is no mass.     Tenderness: There is no abdominal tenderness.  Musculoskeletal:        General: No swelling.     Right lower leg: No edema.     Left lower leg: No edema.  Lymphadenopathy:     Cervical: No cervical adenopathy.     Upper Body:     Right upper body: No supraclavicular or axillary adenopathy.     Left upper body: No supraclavicular or axillary adenopathy.     Lower Body: No right inguinal adenopathy. No left inguinal adenopathy.  Skin:    General: Skin is warm.     Coloration: Skin is not jaundiced.     Findings: No lesion or rash.  Neurological:     General: No focal deficit present.     Mental Status: She is alert and oriented to person, place, and time. Mental status is at baseline.  Psychiatric:        Mood and Affect: Mood normal.        Behavior: Behavior normal.  Thought Content: Thought content normal.    LABS:   CBC Latest Ref Rng & Units 07/07/2021 12/10/2012  WBC - 4.2 5.4  Hemoglobin 12.0 - 16.0 11.9(A) 11.1(L)  Hematocrit 36 - 46 37 33.6(L)  Platelets 150 - 399 171 254   CMP Latest Ref Rng & Units 07/07/2021 04/07/2021 07/06/2020  Glucose 65 - 99 mg/dL - - -  BUN 4 - 21 11 - -  Creatinine 0.5 - 1.1 1.5(A) - -  Sodium 137 - 147 142 - -  Potassium 3.4 - 5.3 3.3(A) - -  Chloride 99 - 108 107 - -  CO2 13 - 22 28(A) - -  Calcium 8.7 - 10.7 10.3 10.9(H) 10.9(H)  Total Protein 6.1 - 8.1 g/dL - - -  Total Bilirubin 0.2 - 1.2 mg/dL - - -  Alkaline Phos 25 - 125 79 - -  AST 13 - 35 23 - -  ALT 7 - 35 13 - -   Recent Review Flowsheet Data     Oncology Labs Latest Ref Rng & Units 07/07/2021   LDH 98 - 192 U/L 126       ASSESSMENT & PLAN:  Assessment/Plan:  A 80 y.o. female with a remote history of both mantle cell lymphoma and colon cancer.  She recently presented with a right pleural effusion, with nodules also seen throughout her right hemithorax.  Her lung exam was clear today, which concerns me that there may not be an ample amount of fluid left to perform another thoracentesis.  A chest x-ray will be done to determine if there remains enough fluid left to do this procedure.  If not, I will rely upon interventional radiology to possibly biopsy one of her right lung nodules.  I will see her back in 3 months for repeat clinical.  Hopefully, by then, some type of pathologic diagnosis will have been established.  The patient understands all the plans discussed today and is in agreement with them.     Kiyah Demartini Macarthur Critchley, MD

## 2021-07-07 ENCOUNTER — Ambulatory Visit (INDEPENDENT_AMBULATORY_CARE_PROVIDER_SITE_OTHER): Payer: Medicare PPO | Admitting: "Endocrinology

## 2021-07-07 ENCOUNTER — Inpatient Hospital Stay (INDEPENDENT_AMBULATORY_CARE_PROVIDER_SITE_OTHER): Payer: Medicare PPO | Admitting: Oncology

## 2021-07-07 ENCOUNTER — Other Ambulatory Visit: Payer: Self-pay | Admitting: Hematology and Oncology

## 2021-07-07 ENCOUNTER — Inpatient Hospital Stay: Payer: Medicare PPO | Attending: Oncology

## 2021-07-07 VITALS — BP 171/79 | HR 83 | Temp 98.5°F | Resp 14 | Ht 61.0 in | Wt 132.3 lb

## 2021-07-07 DIAGNOSIS — Z9221 Personal history of antineoplastic chemotherapy: Secondary | ICD-10-CM | POA: Diagnosis not present

## 2021-07-07 DIAGNOSIS — J90 Pleural effusion, not elsewhere classified: Secondary | ICD-10-CM

## 2021-07-07 DIAGNOSIS — Z8572 Personal history of non-Hodgkin lymphomas: Secondary | ICD-10-CM | POA: Diagnosis not present

## 2021-07-07 DIAGNOSIS — C8318 Mantle cell lymphoma, lymph nodes of multiple sites: Secondary | ICD-10-CM

## 2021-07-07 DIAGNOSIS — Z85038 Personal history of other malignant neoplasm of large intestine: Secondary | ICD-10-CM | POA: Diagnosis not present

## 2021-07-07 HISTORY — DX: Pleural effusion, not elsewhere classified: J90

## 2021-07-07 LAB — BASIC METABOLIC PANEL
BUN: 11 (ref 4–21)
CO2: 28 — AB (ref 13–22)
Chloride: 107 (ref 99–108)
Creatinine: 1.5 — AB (ref 0.5–1.1)
Glucose: 101
Potassium: 3.3 — AB (ref 3.4–5.3)
Sodium: 142 (ref 137–147)

## 2021-07-07 LAB — COMPREHENSIVE METABOLIC PANEL
Albumin: 4 (ref 3.5–5.0)
Calcium: 10.3 (ref 8.7–10.7)

## 2021-07-07 LAB — HEPATIC FUNCTION PANEL
ALT: 13 (ref 7–35)
AST: 23 (ref 13–35)
Alkaline Phosphatase: 79 (ref 25–125)
Bilirubin, Total: 1.5

## 2021-07-07 LAB — LACTATE DEHYDROGENASE: LDH: 126 U/L (ref 98–192)

## 2021-07-07 LAB — CBC: RBC: 4.11 (ref 3.87–5.11)

## 2021-07-07 LAB — CBC AND DIFFERENTIAL
HCT: 37 (ref 36–46)
Hemoglobin: 11.9 — AB (ref 12.0–16.0)
Neutrophils Absolute: 1.76
Platelets: 171 (ref 150–399)
WBC: 4.2

## 2021-07-09 ENCOUNTER — Other Ambulatory Visit: Payer: Self-pay | Admitting: Oncology

## 2021-07-09 DIAGNOSIS — C8318 Mantle cell lymphoma, lymph nodes of multiple sites: Secondary | ICD-10-CM

## 2021-07-09 LAB — CEA: CEA: 2.3 ng/mL (ref 0.0–4.7)

## 2021-07-13 ENCOUNTER — Other Ambulatory Visit: Payer: Self-pay

## 2021-07-13 NOTE — Patient Outreach (Signed)
[facility]) Care Management  07/13/2021  HINDY PERRAULT May 20, 1941 [id number]   Telephone Assessment   Successful outreach call to the home. Spoke with daughter-Connie(DPR on file).She voices that patient has been doing fairly well. She has been having several MD appts and tests done to evaluate next steps to take. She denies any RN CM needs or concerns at this time.   Medications Reviewed Today     Reviewed by Hayden Pedro, RN (Registered Nurse) on 07/13/21 at 1010  Med List Status: <None>   Medication Order Taking? Sig Documenting Provider Last Dose Status Informant  aspirin 81 MG chewable tablet [id number] No Chew by mouth daily. [provider] Taking Active Family Member  calcitRIOL (ROCALTROL) 0.25 MCG capsule [id number] No TAKE 1 CAPSULE TWICE WEEKLY Sherrlyn Hock, MD Taking Active   ezetimibe (ZETIA) 10 MG tablet [id number] No Take 10 mg by mouth daily. [provider] Taking Active Family Member  fesoterodine (TOVIAZ) 4 MG TB24 tablet [id number] No Take 4 mg by mouth daily. [provider] Taking Active   gabapentin (NEURONTIN) 300 MG capsule [id number] No Take 300 mg by mouth 2 (two) times daily. [provider] Taking Active   loratadine (CLARITIN) 10 MG tablet [id number] No Take 10 mg by mouth daily. [provider] Taking Active   losartan (COZAAR) 100 MG tablet [id number] No  [provider] Taking Active   metoprolol tartrate (LOPRESSOR) 50 MG tablet [id number] No Take 50 mg by mouth daily. [provider] Taking Active            Med Note Jerline Pain, [facility] N   Thu Mar 05, 2020 10:43 AM) 100 mg  omeprazole (PRILOSEC) 40 MG capsule [id number] No  [provider] Taking Active   potassium chloride (K-DUR,KLOR-CON) 10 MEQ tablet [id number] No  [provider] Taking Active   potassium citrate (UROCIT-K) 10 MEQ (1080 MG) SR tablet [id number] No  [provider]  Taking Active   pravastatin (PRAVACHOL) 40 MG tablet [id number] No  [provider] Taking Active   pyridoxine (B-6) 100 MG tablet [id number] No Take 100 mg by mouth daily. [provider] Taking Active   Vitamin D, Ergocalciferol, (DRISDOL) 1.25 MG (50000 UNIT) CAPS capsule [id number]  TAKE 1 CAPSULE BY MOUTH EVERY OTHER WEEK Sherrlyn Hock, MD  Active             Care Plan : RN Care Manager POC  Updates made by Hayden Pedro, RN since 07/13/2021 12:00 AM     Problem: Care Coordination Needs and Ongoing Disease Mgmt Education and Support of Chronic Conditions(Thyroid Issues,COPD)   Priority: High     Long-Range Goal: Development of POC for Mgmt of Chronic Conditions   Start Date: 07/13/2021  Expected End Date: 07/13/2022  Priority: High  Note:    Current Barriers:  Knowledge Deficits related to plan of care for management of COPD and hyperparathyroidism/thyroid nodules Chronic Disease Management support and education needs related to COPD and hyperparathyroidism/thyroid nodules  RNCM Clinical Goal(s):  Patient will verbalize understanding of plan for management of COPD and thyroid problems verbalize basic understanding of  COPD and thyroid problems, disease process and self health management plan for COPD and thyroid issues take all medications exactly as prescribed and will call provider for medication related questions attend all scheduled medical appointments: including PCP, surgical eval appt not experience hospital admission. Hospital Admissions in last 6 months = 1  through collaboration with RN Care manager,  provider, and care team.   Interventions: POC sent to PCP upon initial assessment, quarterly and with any changes in patient's conditions Inter-disciplinary care team collaboration (see longitudinal plan of care) Evaluation of current treatment plan related to  self management and patient's adherence to plan as established by  provider  Thyroid Issues(hyperparathyroidism/thyroid nodule)New goal.-Daughter reports that they saw surgeon recently and patient is a candidate for surgery and they are hopeful that surgery will be scheduled soon. Evaluation of current treatment plan related to  thyroid issues ,  self-management and patient's adherence to plan as established by provider. Discussed plans with patient for ongoing care management follow up and provided patient with direct contact information for care management team Reviewed s/s of worsening condition and when to seek medical attention  COPD Interventions:   Provided patient with basic written and verbal COPD education on self care/management/and exacerbation prevention; Advised patient to self assesses COPD action plan zone and make appointment with provider if in the yellow zone for 48 hours without improvement;  Patient Goals/Self-Care Activities: Patient will attend all scheduled provider appointments Patient will call provider office for new concerns or questions Patient will have no unplanned hospital readmission within the next 31 days Patient will follow up with surgeon regarding possible interventions for tx of nodule  Follow Up Plan:  Telephone follow up appointment with care management team member scheduled for:  4-6wks The patient has been provided with contact information for the care management team and has been advised to call with any health related questions or concerns.        Plan: RN CM discussed with caregiver next outreach within 4-6wks. Caregiver agrees to care plan and follow up.  Enzo Montgomery, RN,BSN,CCM [place] [facility] Telephonic Care Management Coordinator Direct Phone: [phone] Toll Free: [phone] Fax: [phone]

## 2021-07-19 ENCOUNTER — Telehealth: Payer: Self-pay | Admitting: Oncology

## 2021-07-19 NOTE — Telephone Encounter (Signed)
Patient rescheduled from 11/16 to 11/17 Follow Up at 1:45 pm (PET Scan Results) Ok per pt's daughter

## 2021-07-22 DIAGNOSIS — R7309 Other abnormal glucose: Secondary | ICD-10-CM | POA: Diagnosis not present

## 2021-07-22 DIAGNOSIS — C8318 Mantle cell lymphoma, lymph nodes of multiple sites: Secondary | ICD-10-CM | POA: Diagnosis not present

## 2021-07-22 NOTE — Progress Notes (Signed)
[place]  [address] [place],  [place]  [address] [phone]  Clinic Day:  07/29/2021  Referring physician: Garwin Brothers, MD  This document serves as a record of services personally performed by Jamahl Lemmons Macarthur Critchley, MD. It was created on their behalf by [facility] E, a trained medical scribe. The creation of this record is based on the scribe's personal observations and the provider's statements to them.  HISTORY OF PRESENT ILLNESS:  The patient is a 80 y.o. female with stage IIA (T3 N0 M0) colon cancer, status post a right hemicolectomy in December 2014.  The patient also has a history of stage IVA mantle cell lymphoma, for which she underwent 6 cycles of bendamustine/Rituxan, followed by 2 years of maintenance Rituxan, which were completed in March 2014.  She comes in today to go over her PET scan images, which were done after a CT scan in October 2022 showed a right-sided pleural effusion and multiple pulmonary nodules.  Over the past month, the patient has been doing better.  She denies having any further shortness of breath.  She also denies having any right chest wall pain, a productive cough, or hemoptysis.    PHYSICAL EXAM:  Blood pressure (!) 186/86, pulse 75, temperature 98.1 F (36.7 C), resp. rate 14, height 5\' 1"  (1.549 m), weight 133 lb 6.4 oz (60.5 kg), SpO2 99 %. Wt Readings from Last 3 Encounters:  07/29/21 133 lb 6.4 oz (60.5 kg)  07/07/21 132 lb 4.8 oz (60 kg)  04/07/21 143 lb 9.6 oz (65.1 kg)   Body mass index is 25.21 kg/m. Performance status (ECOG): 1 - Symptomatic but completely ambulatory Physical Exam Constitutional:      Appearance: Normal appearance. She is not ill-appearing.  HENT:     Mouth/Throat:     Mouth: Mucous membranes are moist.     Pharynx: Oropharynx is clear. No oropharyngeal exudate or posterior oropharyngeal erythema.  Cardiovascular:     Rate and Rhythm: Normal rate and regular rhythm.     Heart  sounds: No murmur heard.   No friction rub. No gallop.  Pulmonary:     Effort: Pulmonary effort is normal. No respiratory distress.     Breath sounds: Normal breath sounds. No decreased breath sounds, wheezing, rhonchi or rales.  Abdominal:     General: Bowel sounds are normal. There is no distension.     Palpations: Abdomen is soft. There is no mass.     Tenderness: There is no abdominal tenderness.  Musculoskeletal:        General: No swelling.     Right lower leg: No edema.     Left lower leg: No edema.  Lymphadenopathy:     Cervical: No cervical adenopathy.     Upper Body:     Right upper body: No supraclavicular or axillary adenopathy.     Left upper body: No supraclavicular or axillary adenopathy.     Lower Body: No right inguinal adenopathy. No left inguinal adenopathy.  Skin:    General: Skin is warm.     Coloration: Skin is not jaundiced.     Findings: No lesion or rash.  Neurological:     General: No focal deficit present.     Mental Status: She is alert and oriented to person, place, and time. Mental status is at baseline.  Psychiatric:        Mood and Affect: Mood normal.        Behavior: Behavior normal.  Thought Content: Thought content normal.   SCANS:  Her PET scan revealed the following: FINDINGS: Mediastinal blood pool activity: SUV max 1.5  Liver activity: SUV max 2.3  NECK:  No hypermetabolic cervical lymph nodes are identified.There is prominent metabolic activity throughout the lymphoid tissue of Waldeyer's ring, likely physiologic. No other focal abnormalities of the pharyngeal mucosal space identified.  Incidental CT findings: Bilateral carotid atherosclerosis.  CHEST:  There are no hypermetabolic mediastinal, hilar or axillary lymph nodes. There is no hypermetabolic activity within the right pleural space or lungs. The previously demonstrated right pleural effusion has resolved. There is improving aeration of the right lung  with residual scattered subpleural densities which are not hypermetabolic. In the right lower lobe, there is a subpleural component measuring 3.1 x 1.9 cm on image 129/3 which has decreased from the previous study. Subpleural nodular densities in the right upper lobe also appears slightly smaller, measuring 0.8 cm on image 78/3 and 1.5 cm on image 92/3. No suspicious left lung nodules.  Incidental CT findings: Stable calcifications within the right thyroid gland. Right IJ Port-A-Cath extends to the superior cavoatrial junction. Atherosclerosis of the aorta, great vessels and coronary arteries.  ABDOMEN/PELVIS:  There is no hypermetabolic activity within the liver, adrenal glands, spleen or pancreas. There is no hypermetabolic nodal activity.  Incidental CT findings: Bilateral renal cysts are unchanged. Diffuse aortic and branch vessel atherosclerosis. Status post right hemicolectomy with anastomosis.  SKELETON:  There is no hypermetabolic activity to suggest osseous metastatic disease.  Incidental CT findings: Lumbar spine degenerative changes are noted.  IMPRESSION: 1. There is no hypermetabolic activity within the right pleural space or lungs. The previously demonstrated pleural effusion and right lung nodular opacities have significantly improved from the CT of 6 weeks prior, most consistent with resolving infection/inflammation. The subpleural nodules have not completely resolved, and chest CT follow-in 6 months should be considered. 2. No specific evidence of recurrent lymphoma or metastatic colon cancer. 3. Nonspecific prominent uptake throughout the lymphoid tissue of Waldeyer's ring, within physiologic limits. 4. Coronary and Aortic Atherosclerosis (ICD10-I70.0).  ASSESSMENT & PLAN:  Assessment/Plan:  An 80 y.o. female with a remote history of both mantle cell lymphoma and colon cancer.  In clinic today, I went over her PET scan images with her, for which she could  see no hypermetabolic activity was present.  Furthermore, virtually all of her right pleural effusion and pulmonary nodules have essentially dissipated.  Understandably, the patient and her family were pleased with the images.  These findings suggest there is no recurrence of either her mantle cell lymphoma or her colon cancer.  Clinically, she is doing well.  For completeness, I will repeat a chest CT in 6 months to ensure nothing has reformed in her right hemithorax.  I will see her back the following day to go over the images and their implications.  The patient understands all the plans discussed today and is in agreement with them.    I, Rita Ohara, am acting as scribe for Marice Potter, MD    I have reviewed this report as typed by the medical scribe, and it is complete and accurate.  [place] Macarthur Critchley, MD

## 2021-07-28 ENCOUNTER — Ambulatory Visit: Payer: Medicare PPO | Admitting: Oncology

## 2021-07-28 ENCOUNTER — Ambulatory Visit: Payer: Medicare PPO | Admitting: Hematology and Oncology

## 2021-07-29 ENCOUNTER — Inpatient Hospital Stay: Payer: Medicare PPO | Attending: Oncology | Admitting: Oncology

## 2021-07-29 VITALS — BP 186/86 | HR 75 | Temp 98.1°F | Resp 14 | Ht 61.0 in | Wt 133.4 lb

## 2021-07-29 DIAGNOSIS — C8318 Mantle cell lymphoma, lymph nodes of multiple sites: Secondary | ICD-10-CM | POA: Diagnosis not present

## 2021-07-30 ENCOUNTER — Telehealth (INDEPENDENT_AMBULATORY_CARE_PROVIDER_SITE_OTHER): Payer: Self-pay | Admitting: "Endocrinology

## 2021-07-30 NOTE — Telephone Encounter (Signed)
Sent secure chat to Dr Tobe Sos

## 2021-07-30 NOTE — Telephone Encounter (Signed)
  Who's calling (name and relationship to patient) : Sherryll Burger; daughter   Best contact number: [phone]  Provider they see: Dr. Tobe Sos  Reason for call: Daughter has called in requesting to speak with Dr. Tobe Sos regarding the upcoming surgery on Dec. 2nd. She has requested a call back asap    PRESCRIPTION REFILL ONLY  Name of prescription:  Pharmacy:

## 2021-07-30 NOTE — Telephone Encounter (Signed)
Ms. Joy Daniels called asking me to call her ASAP. Her mother is scheduled for surgery on 08/13/21. When I immediately returned her call, she was not available . I left a voicemail message asking her to return my call.   Tillman Sers, MD, CDE

## 2021-08-02 NOTE — Patient Instructions (Signed)
DUE TO COVID-19 ONLY ONE VISITOR IS ALLOWED TO COME WITH YOU AND STAY IN THE WAITING ROOM ONLY DURING PRE OP AND PROCEDURE.   **NO VISITORS ARE ALLOWED IN THE SHORT STAY AREA OR RECOVERY ROOM!!**   You are not required to quarantine, however you are required to wear a well-fitted mask when you are out and around people not in your household.  Hand Hygiene often Do NOT share personal items Notify your provider if you are in close contact with someone who has COVID or you develop fever 100.4 or greater, new onset of sneezing, cough, sore throat, shortness of breath or body aches.    Your procedure is scheduled on:  Friday, 08-13-21   Report to [facility]  Entrance     Report to admitting at 6:45 AM   Call this number if you have problems the morning of surgery [phone]   Do not eat food :After Midnight.   May have liquids until 6:00 AM day of surgery  CLEAR LIQUID DIET  Foods Allowed                                                                     Foods Excluded  Water, Black Coffee (no milk/no creamer) and tea, regular and decaf                              liquids that you cannot  Plain Jell-O in any flavor  (No red)                         see through such as: Fruit ices (not with fruit pulp)                                 milk, soups, orange juice  Iced Popsicles (No red)                                    All solid food                             Apple juices Sports drinks like Gatorade (No red) Lightly seasoned clear broth or consume(fat free) Sugar   Oral Hygiene is also important to reduce your risk of infection.                                    Remember - BRUSH YOUR TEETH THE MORNING OF SURGERY WITH YOUR REGULAR TOOTHPASTE   Do NOT smoke after Midnight   Take these medicines the morning of surgery with A SIP OF WATER:  Zetia, Claritin, Metoprolol, Omeprazole, Pravastatin   Aspirin -    Stop all vitamins and herbal supplements a week before  surgery             You may not have any metal on your body including hair pins, jewelry, and body piercing  Do not wear make-up, lotions, powders, perfumes or deodorant  Do not wear nail polish including gel and S&S, artificial/acrylic nails, or any other type of covering on natural nails including finger and toenails. If you have artificial nails, gel coating, etc. that needs to be removed by a nail salon please have this removed prior to surgery or surgery may need to be canceled/ delayed if the surgeon/ anesthesia feels like they are unable to be safely monitored.   Do not shave  48 hours prior to surgery.               Men may shave face and neck.  Do not bring valuables to the hospital. [place].   Contacts, dentures or bridgework may not be worn into surgery.    Patients discharged the day of surgery will not be allowed to drive home.  Special Instructions: Bring a copy of your healthcare power of attorney and living will documents the day of surgery if you haven't scanned them in before.  Please read over the following fact sheets you were given: IF YOU HAVE QUESTIONS ABOUT YOUR PRE OP INSTRUCTIONS PLEASE CALL [phone]   [facility] - Preparing for Surgery Before surgery, you can play an important role.  Because skin is not sterile, your skin needs to be as free of germs as possible.  You can reduce the number of germs on your skin by washing with CHG (chlorahexidine gluconate) soap before surgery.  CHG is an antiseptic cleaner which kills germs and bonds with the skin to continue killing germs even after washing. Please DO NOT use if you have an allergy to CHG or antibacterial soaps.  If your skin becomes reddened/irritated stop using the CHG and inform your nurse when you arrive at Short Stay. Do not shave (including legs and underarms) for at least 48 hours prior to the first CHG shower.  You may shave your face/neck.  Please  follow these instructions carefully:  1.  Shower with CHG Soap the night before surgery and the  morning of surgery.  2.  If you choose to wash your hair, wash your hair first as usual with your normal  shampoo.  3.  After you shampoo, rinse your hair and body thoroughly to remove the shampoo.                             4.  Use CHG as you would any other liquid soap.  You can apply chg directly to the skin and wash.  Gently with a scrungie or clean washcloth.  5.  Apply the CHG Soap to your body ONLY FROM THE NECK DOWN.   Do   not use on face/ open                           Wound or open sores. Avoid contact with eyes, ears mouth and   genitals (private parts).                       Wash face,  Genitals (private parts) with your normal soap.             6.  Wash thoroughly, paying special attention to the area where your    surgery  will be performed.  7.  Thoroughly rinse your body with warm water from the neck down.  8.  DO NOT shower/wash with your normal soap after using and rinsing off the CHG Soap.                9.  Pat yourself dry with a clean towel.            10.  Wear clean pajamas.            11.  Place clean sheets on your bed the night of your first shower and do not  sleep with pets. Day of Surgery : Do not apply any lotions/deodorants the morning of surgery.  Please wear clean clothes to the hospital/surgery center.  FAILURE TO FOLLOW THESE INSTRUCTIONS MAY RESULT IN THE CANCELLATION OF YOUR SURGERY  PATIENT SIGNATURE_________________________________  NURSE SIGNATURE__________________________________  ________________________________________________________________________

## 2021-08-04 ENCOUNTER — Encounter (HOSPITAL_COMMUNITY)
Admission: RE | Admit: 2021-08-04 | Discharge: 2021-08-04 | Disposition: A | Discharge status: Home / Self Care | Payer: Medicare PPO | Source: Ambulatory Visit | Attending: Internal Medicine | Admitting: Internal Medicine

## 2021-08-12 ENCOUNTER — Other Ambulatory Visit: Payer: Self-pay

## 2021-08-12 NOTE — Patient Outreach (Signed)
[facility]) Care Management  08/12/2021  Joy Daniels 1940-09-25 [id number]   Telephone Assessment    Successful outreach call. Spoke with daughter/caregiver-Connie who is not at home and with patient at present. She is pleased to report how well patient is doing and recent good report from oncologist that levels have normalized and PET scan showed no signs of cancer. Daughter states she cancelled surgery until she has speak with MD more about if surgery for thyroid is really neccessary at this time. She is awaiting to hear back from DM. Patient goes for PCP appt on 08/20/21. Daughter will be accompanying her. She denies any RN CM needs or concerns at this time.    Medications Reviewed Today     Reviewed by Nat Christen, CPhT (Pharmacy Technician) on 08/02/21 at 1023  Med List Status: Per Pt - Procedure Cancelled   Medication Order Taking? Sig Documenting Provider Last Dose Status Informant  aspirin 81 MG chewable tablet [id number]  Chew by mouth daily. [provider]  Active Family Member  ezetimibe (ZETIA) 10 MG tablet [id number]  Take 10 mg by mouth daily. [provider]  Active Family Member  loratadine (CLARITIN) 10 MG tablet [id number]  Take 10 mg by mouth daily. [provider]  Active   metoprolol tartrate (LOPRESSOR) 50 MG tablet [id number]  Take 50 mg by mouth daily. [provider]  Active            Med Note Jerline Pain, [facility]   Thu Mar 05, 2020 10:43 AM) 100 mg  omeprazole (PRILOSEC) 40 MG capsule [id number]   [provider]  Active   pravastatin (PRAVACHOL) 40 MG tablet [id number]   [provider]  Active             Care Plan : RN Care Manager POC  Updates made by Hayden Pedro, RN since 08/12/2021 12:00 AM     Problem: Care Coordination Needs and Ongoing Disease Mgmt Education and Support of Chronic Conditions(Thyroid Issues,COPD)   Priority: High     Long-Range Goal:  Development of POC for Mgmt of Chronic Conditions   Start Date: 07/13/2021  Expected End Date: 07/13/2022  This Visit's Progress: On track  Priority: High  Note:    Current Barriers:  Knowledge Deficits related to plan of care for management of COPD and hyperparathyroidism/thyroid nodules Chronic Disease Management support and education needs related to COPD and hyperparathyroidism/thyroid nodules  RNCM Clinical Goal(s):  Patient will verbalize understanding of plan for management of COPD and thyroid problems verbalize basic understanding of  COPD and thyroid problems, disease process and self health management plan for COPD and thyroid issues take all medications exactly as prescribed and will call provider for medication related questions attend all scheduled medical appointments: including PCP, surgical eval appt not experience hospital admission. Hospital Admissions in last 6 months = 1  through collaboration with RN Care manager, provider, and care team.   Interventions: POC sent to PCP upon initial assessment, quarterly and with any changes in patient's conditions Inter-disciplinary care team collaboration (see longitudinal plan of care) Evaluation of current treatment plan related to  self management and patient's adherence to plan as established by provider  Thyroid Issues(hyperparathyroidism/thyroid nodule)Goal: on track -Long term goal-  Daughter reports that they saw surgeon recently and patient is a candidate for surgery and they are hopeful that surgery will be scheduled soon.  08/12/21-Daughter states that recent PET scan was good and oncologist reported levels were  normalizing. Daughter canceled patient's upcoming thyroid surgery-want to speak with MD to determine if surgery really needed at this time-awaiting further response from MD   Evaluation of current treatment plan related to  thyroid issues ,  self-management and patient's adherence to plan as established by  provider. Discussed plans with patient for ongoing care management follow up and provided patient with direct contact information for care management team Reviewed s/s of worsening condition and when to seek medical attention  COPD Interventions:  Goal: On Track  Long Term Goal 08/12/21-Dtr reports no current resp issues at present. Provided patient with basic written and verbal COPD education on self care/management/and exacerbation prevention; Advised patient to self assesses COPD action plan zone and make appointment with provider if in the yellow zone for 48 hours without improvement;  Patient Goals/Self-Care Activities: Patient will attend all scheduled provider appointments Patient will call provider office for new concerns or questions Patient will have no unplanned hospital readmission within the next 31 days Patient will follow up with surgeon regarding possible interventions for tx of nodule  Follow Up Plan:  Telephone follow up appointment with care management team member scheduled for:  4-6wks The patient has been provided with contact information for the care management team and has been advised to call with any health related questions or concerns.       Plan: RN CM discussed with caregiver next outreach within 4-6wks. Caregiver agrees to care plan and follow up.   Enzo Montgomery, RN,BSN,CCM [place] [facility] Telephonic Care Management Coordinator Direct Phone: [phone] Toll Free: [phone] Fax: [phone]

## 2021-08-13 ENCOUNTER — Ambulatory Visit: Payer: Self-pay

## 2021-08-13 ENCOUNTER — Ambulatory Visit (HOSPITAL_COMMUNITY): Admission: RE | Admit: 2021-08-13 | Payer: Medicare PPO | Source: Home / Self Care | Admitting: Surgery

## 2021-08-13 ENCOUNTER — Telehealth: Payer: Self-pay | Admitting: Oncology

## 2021-08-13 ENCOUNTER — Encounter (HOSPITAL_COMMUNITY): Admission: RE | Payer: Self-pay | Source: Home / Self Care

## 2021-08-13 SURGERY — PARATHYROIDECTOMY
Anesthesia: General | Laterality: Left

## 2021-08-13 NOTE — Telephone Encounter (Signed)
Per 11/17 LOS, patient scheduled for May 2023 Follow Up.  Will scheduled Labs, CT after receiving PreAuth in April.  Gave patient Appt Summary

## 2021-08-30 DIAGNOSIS — I10 Essential (primary) hypertension: Secondary | ICD-10-CM | POA: Diagnosis not present

## 2021-08-30 DIAGNOSIS — E782 Mixed hyperlipidemia: Secondary | ICD-10-CM | POA: Diagnosis not present

## 2021-09-16 ENCOUNTER — Other Ambulatory Visit: Payer: Self-pay

## 2021-09-16 NOTE — Patient Outreach (Signed)
[place] [facility]) Care Management  09/16/2021  Joy Daniels 12-19-1940 [id number]   Telephone Assessment    Successful outreach call placed to patient's daughter-Cnnie. She is pleased to report how well patient has been doing lately. She has been able to celebrate the holidays and family birthdays. She denies any new issues or concerns at present.    Medications Reviewed Today     Reviewed by Hayden Pedro, RN (Registered Nurse) on 09/16/21 at [place] List Status: <None>   Medication Order Taking? Sig Documenting Provider Last Dose Status Informant  aspirin 81 MG chewable tablet [id number] No Chew by mouth daily. [provider] Taking Active Family Member  ezetimibe (ZETIA) 10 MG tablet [id number] No Take 10 mg by mouth daily. [provider] Taking Active Family Member  loratadine (CLARITIN) 10 MG tablet [id number] No Take 10 mg by mouth daily. [provider] Taking Active   metoprolol tartrate (LOPRESSOR) 50 MG tablet [id number] No Take 50 mg by mouth daily. [provider] Taking Active            Med Note Jerline Pain, [facility]   Thu Mar 05, 2020 10:43 AM) 100 mg  omeprazole (PRILOSEC) 40 MG capsule [id number] No  [provider] Taking Active   pravastatin (PRAVACHOL) 40 MG tablet [id number] No  [provider] Taking Active             Care Plan : RN Care Manager POC  Updates made by Hayden Pedro, RN since 09/16/2021 12:00 AM     Problem: Care Coordination Needs and Ongoing Disease Mgmt Education and Support of Chronic Conditions(Thyroid Issues,COPD)   Priority: High     Long-Range Goal: Development of POC for Mgmt of Chronic Conditions-COPD and thyroid issues   Start Date: 07/13/2021  Expected End Date: 07/13/2022  This Visit's Progress: On track  Recent Progress: On track  Priority: High  Note:    Current Barriers:  Knowledge Deficits related to plan of care for management of  COPD and hyperparathyroidism/thyroid nodules Chronic Disease Management support and education needs related to COPD and hyperparathyroidism/thyroid nodules  RNCM Clinical Goal(s):  Patient will verbalize understanding of plan for management of COPD and thyroid problems verbalize basic understanding of  COPD and thyroid problems, disease process and self health management plan for COPD and thyroid issues take all medications exactly as prescribed and will call provider for medication related questions attend all scheduled medical appointments: including PCP, surgical eval appt not experience hospital admission. Hospital Admissions in last 6 months = 1  through collaboration with RN Care manager, provider, and care team.   Interventions: POC sent to PCP upon initial assessment, quarterly and with any changes in patient's conditions Inter-disciplinary care team collaboration (see longitudinal plan of care) Evaluation of current treatment plan related to  self management and patient's adherence to plan as established by provider  Thyroid Issues(hyperparathyroidism/thyroid nodule)Goal: on track -Long term goal-  Daughter reports that they saw surgeon recently and patient is a candidate for surgery and they are hopeful that surgery will be scheduled soon.  08/12/21-Daughter states that recent PET scan was good and oncologist reported levels were normalizing. Daughter canceled patient's upcoming thyroid surgery-want to speak with MD to determine if surgery really needed at this time-awaiting further response from MD 09/16/21-Daughter sets that surgery remains cancelled at present due to patient doing so well and lab results imrpoved.    Evaluation of current treatment plan related to  thyroid issues ,  self-management and patient's adherence to plan as established by provider. Discussed plans with patient for ongoing care management follow up and provided patient with direct contact information for  care management team Reviewed s/s of worsening condition and when to seek medical attention  COPD Interventions:  Goal: On Track  Long Term Goal 08/12/21-Dtr reports no current resp issues at present. 09/16/21-Breathing has remained good throughout winter months so far. No issues at present.  Provided patient with basic written and verbal COPD education on self care/management/and exacerbation prevention; Advised patient to self assesses COPD action plan zone and make appointment with provider if in the yellow zone for 48 hours without improvement;  Patient Goals/Self-Care Activities: Patient will attend all scheduled provider appointments Patient will call provider office for new concerns or questions Patient will have no unplanned hospital readmission within the next 31 days Patient will follow up with surgeon regarding possible interventions for tx of nodule  Follow Up Plan:  Telephone follow up appointment with care management team member scheduled for:  within the month of March The patient has been provided with contact information for the care management team and has been advised to call with any health related questions or concerns.        Plan: RN CM discussed with caregiver next outreach within the month of March. Caregiver agrees to care plan and follow up. RN CM will send quarterly update to PCP.   Enzo Montgomery, RN,BSN,CCM [place] Management Telephonic Care Management Coordinator Direct Phone: [phone] Toll Free: [phone] Fax: [phone]

## 2021-10-18 DIAGNOSIS — E119 Type 2 diabetes mellitus without complications: Secondary | ICD-10-CM | POA: Diagnosis not present

## 2021-11-09 DIAGNOSIS — I10 Essential (primary) hypertension: Secondary | ICD-10-CM | POA: Diagnosis not present

## 2021-11-09 DIAGNOSIS — E782 Mixed hyperlipidemia: Secondary | ICD-10-CM | POA: Diagnosis not present

## 2021-11-18 ENCOUNTER — Other Ambulatory Visit: Payer: Self-pay

## 2021-11-18 NOTE — Patient Outreach (Signed)
[place] [facility]) Care Management ? ?11/18/2021 ? ?Joy Daniels ?[birth year] ?[id number] ? ? ?Telephone Assessment ? ? ?Successful outreach call paced to daughter-Connie. She reports patient continues to do well. She ah been more active/mobile and able tog et outside and walk with grandkids. Patient did suffer the loss of her sister last week. Condolences given and patient reported as coping well. Appetite remains good. Wgt stable-she has gained a few pounds. No recent falls. Patient does not go back tos see oncologist until May. Denies any RN CM needs or concerns at this time. ? ? ? ?Medications Reviewed Today   ? ? Reviewed by Hayden Pedro, RN (Registered Nurse) on 11/18/21 at [place] List Status: <None>  ? ?Medication Order Taking? Sig Documenting Provider Last Dose Status Informant  ?aspirin 81 MG chewable tablet [id number] No Chew by mouth daily. [provider] Taking Active Family Member  ?ezetimibe (ZETIA) 10 MG tablet [id number] No Take 10 mg by mouth daily. [provider] Taking Active Family Member  ?loratadine (CLARITIN) 10 MG tablet [id number] No Take 10 mg by mouth daily. [provider] Taking Active   ?metoprolol tartrate (LOPRESSOR) 50 MG tablet [id number] No Take 50 mg by mouth daily. [provider] Taking Active   ?         ?Med Note Jerline Pain, [facility]   Thu Mar 05, 2020 10:43 AM) 100 mg  ?omeprazole (PRILOSEC) 40 MG capsule [id number] No  [provider] Taking Active   ?pravastatin (PRAVACHOL) 40 MG tablet [id number] No  [provider] Taking Active   ? ?  ?  ? ?  ?  ?Care Plan : RN Care Manager POC  ?Updates made by Hayden Pedro, RN since 11/18/2021 12:00 AM  ?  ? ?Problem: Care Coordination Needs and Ongoing Disease Mgmt Education and Support of Chronic Conditions(Thyroid Issues,COPD)   ?Priority: High  ?  ? ?Long-Range Goal: Development of POC for Mgmt of Chronic Conditions-COPD and thyroid issues    ?Start Date: 07/13/2021  ?Expected End Date: 07/13/2022  ?This Visit's Progress: On track  ?Recent Progress: On track  ?Priority: High  ?Note:   ? ?Current Barriers:  ?Knowledge Deficits related to plan of care for management of COPD and hyperparathyroidism/thyroid nodules ?Chronic Disease Management support and education needs related to COPD and hyperparathyroidism/thyroid nodules ? ?RNCM Clinical Goal(s):  ?Patient will verbalize understanding of plan for management of COPD and thyroid problems ?verbalize basic understanding of  COPD and thyroid problems, disease process and self health management plan for COPD and thyroid issues ?take all medications exactly as prescribed and will call provider for medication related questions ?attend all scheduled medical appointments: including PCP, surgical eval appt ?not experience hospital admission. Hospital Admissions in last 6 months = 1  through collaboration with RN Care manager, provider, and care team.  ? ?Interventions: ?POC sent to PCP upon initial assessment, quarterly and with any changes in patient's conditions ?Inter-disciplinary care team collaboration (see longitudinal plan of care) ?Evaluation of current treatment plan related to  self management and patient's adherence to plan as established by provider ? ? ? ?Thyroid Issues  (Status:  Goal on track:  Yes.)  Long Term Goal ? ?Daughter reports that they saw surgeon recently and patient is a candidate for surgery and they are hopeful that surgery will be scheduled soon.  ?08/12/21-Daughter states that recent PET scan was good and oncologist reported levels were normalizing. Daughter canceled patient's upcoming thyroid surgery-want to speak  with MD to determine if surgery really needed at this time-awaiting further response from MD ?09/16/21-Daughter sets that surgery remains cancelled at present due to patient doing so well and lab results imrpoved.  ?11/18/21-Patient's condition remains table. She goes to see  oncologist May for further testing/labs. Surgery remains on hold unless patient's condition changes.  ? ?Evaluation of current treatment plan related to  thyroid issues ,  self-management and patient's adherence to plan as established by provider. ?Discussed plans with patient for ongoing care management follow up and provided patient with direct contact information for care management team ?Reviewed s/s of worsening condition and when to seek medical attention ? ? ?COPD Interventions:  (Status:  Condition stable.  Not addressed this visit.) Long Term Goal ? ? ?08/12/21-Dtr reports no current resp issues at present. ?09/16/21-Breathing has remained good throughout winter months so far. No issues at present.  ? ?Provided patient with basic written and verbal COPD education on self care/management/and exacerbation prevention; ?Advised patient to self assesses COPD action plan zone and make appointment with provider if in the yellow zone for 48 hours without improvement; ? ?Patient Goals/Self-Care Activities: ?Patient will attend all scheduled provider appointments ?Patient will call provider office for new concerns or questions ?Patient will have no unplanned hospital readmission within the next 31 days ?Patient will follow up with surgeon regarding possible interventions for tx of nodule ? ?Follow Up Plan:  Telephone follow up appointment with care management team member scheduled for:  within the month of May ?The patient has been provided with contact information for the care management team and has been advised to call with any health related questions or concerns.   ?  ?  ?Plan: ?RN CM discussed with caregiver next outreach within the month of May. Caregiver agrees to care plan and follow up. ? ?Enzo Montgomery, RN,BSN,CCM ?[facility] Care Management ?Telephonic Care Management Coordinator ?Direct Phone: [phone] ?Toll Free: [phone] ?Fax: [phone] ? ?

## 2021-11-29 DIAGNOSIS — E782 Mixed hyperlipidemia: Secondary | ICD-10-CM | POA: Diagnosis not present

## 2021-11-29 DIAGNOSIS — I10 Essential (primary) hypertension: Secondary | ICD-10-CM | POA: Diagnosis not present

## 2021-11-29 DIAGNOSIS — J302 Other seasonal allergic rhinitis: Secondary | ICD-10-CM | POA: Diagnosis not present

## 2021-12-10 DIAGNOSIS — E782 Mixed hyperlipidemia: Secondary | ICD-10-CM | POA: Diagnosis not present

## 2021-12-10 DIAGNOSIS — I10 Essential (primary) hypertension: Secondary | ICD-10-CM | POA: Diagnosis not present

## 2021-12-24 NOTE — Telephone Encounter (Signed)
See other note from today

## 2022-01-24 DIAGNOSIS — I2510 Atherosclerotic heart disease of native coronary artery without angina pectoris: Secondary | ICD-10-CM | POA: Diagnosis not present

## 2022-01-24 DIAGNOSIS — R918 Other nonspecific abnormal finding of lung field: Secondary | ICD-10-CM | POA: Diagnosis not present

## 2022-01-24 DIAGNOSIS — R970 Elevated carcinoembryonic antigen [CEA]: Secondary | ICD-10-CM | POA: Diagnosis not present

## 2022-01-24 DIAGNOSIS — I700 Atherosclerosis of aorta: Secondary | ICD-10-CM | POA: Diagnosis not present

## 2022-01-24 DIAGNOSIS — C8318 Mantle cell lymphoma, lymph nodes of multiple sites: Secondary | ICD-10-CM | POA: Diagnosis not present

## 2022-01-25 ENCOUNTER — Other Ambulatory Visit: Payer: Self-pay | Admitting: Oncology

## 2022-01-25 ENCOUNTER — Ambulatory Visit: Payer: Medicare PPO | Admitting: Oncology

## 2022-01-26 ENCOUNTER — Encounter: Payer: Self-pay | Admitting: Oncology

## 2022-01-27 ENCOUNTER — Other Ambulatory Visit: Payer: Self-pay

## 2022-01-27 NOTE — Patient Outreach (Signed)
[place] [facility]) Care Management  01/27/2022  Joy Daniels 10/29/1940 [id number]   Telephone Assessment  Successful outreach all placed to patient's daughter-Connie. She reports patient is doing well and not having any issues at present. Appetite remains good. Wgt stable. No recent falls. Patient has been able to be more active and walk outdoors more lately. She had PET can this week and goes to see oncologist in  few wks for results. Denies any RN CM needs or concerns at this time.   Medications Reviewed Today     Reviewed by Hayden Pedro, RN (Registered Nurse) on 01/27/22 at [phone]  Med List Status: <None>   Medication Order Taking? Sig Documenting Provider Last Dose Status Informant  aspirin 81 MG chewable tablet [id number] No Chew by mouth daily. [provider] Taking Active Family Member  ezetimibe (ZETIA) 10 MG tablet [id number] No Take 10 mg by mouth daily. [provider] Taking Active Family Member  loratadine (CLARITIN) 10 MG tablet [id number] No Take 10 mg by mouth daily. [provider] Taking Active   metoprolol tartrate (LOPRESSOR) 50 MG tablet [id number] No Take 50 mg by mouth daily. [provider] Taking Active            Med Note Jerline Pain, [facility]   Thu Mar 05, 2020 10:43 AM) 100 mg  omeprazole (PRILOSEC) 40 MG capsule [id number] No  [provider] Taking Active   pravastatin (PRAVACHOL) 40 MG tablet [id number] No  [provider] Taking Active              Care Plan : RN Care Manager POC  Updates made by Hayden Pedro, RN since 01/27/2022 12:00 AM     Problem: Care Coordination Needs and Ongoing Disease Mgmt Education and Support of Chronic Conditions(Thyroid Issues,COPD)   Priority: High     Long-Range Goal: Development of POC for Mgmt of Chronic Conditions-COPD and thyroid issues   Start Date: 07/13/2021  Expected End Date: 07/13/2022  This Visit's Progress: On track   Recent Progress: On track  Priority: High  Note:    Current Barriers:  Knowledge Deficits related to plan of care for management of COPD and hyperparathyroidism/thyroid nodules Chronic Disease Management support and education needs related to COPD and hyperparathyroidism/thyroid nodules  RNCM Clinical Goal(s):  Patient will verbalize understanding of plan for management of COPD and thyroid problems verbalize basic understanding of  COPD and thyroid problems, disease process and self health management plan for COPD and thyroid issues take all medications exactly as prescribed and will call provider for medication related questions attend all scheduled medical appointments: including PCP, surgical eval appt not experience hospital admission. Hospital Admissions in last 6 months = 1  through collaboration with RN Care manager, provider, and care team.   Interventions: POC sent to PCP upon initial assessment, quarterly and with any changes in patient's conditions Inter-disciplinary care team collaboration (see longitudinal plan of care) Evaluation of current treatment plan related to  self management and patient's adherence to plan as established by provider    Thyroid Issues  (Status:  Goal on track:  Yes.)  Long Term Goal  Daughter reports that they saw surgeon recently and patient is a candidate for surgery and they are hopeful that surgery will be scheduled soon.  08/12/21-Daughter states that recent PET scan was good and oncologist reported levels were normalizing. Daughter canceled patient's upcoming thyroid surgery-want to speak with MD to determine if surgery really needed at this time-awaiting  further response from MD 09/16/21-Daughter sets that surgery remains cancelled at present due to patient doing so well and lab results imrpoved.  11/18/21-Patient's condition remains table. She goes to see oncologist May for further testing/labs. Surgery remains on hold unless patient's condition  changes.  01/27/22-Patient had recent PET scan and goes to see oncologist ina  few wks for results. Currently not having any sxs or issues per dtr report.  Evaluation of current treatment plan related to  thyroid issues ,  self-management and patient's adherence to plan as established by provider. Discussed plans with patient for ongoing care management follow up and provided patient with direct contact information for care management team Reviewed s/s of worsening condition and when to seek medical attention   COPD Interventions:  (Status:  Goal on track:  Yes.) Long Term Goal   08/12/21-Dtr reports no current resp issues at present. 09/16/21-Breathing has remained good throughout winter months so far. No issues at present.  01/27/22-Condition managed at present. No new issues or concerns.  Provided patient with basic written and verbal COPD education on self care/management/and exacerbation prevention; Advised patient to self assesses COPD action plan zone and make appointment with provider if in the yellow zone for 48 hours without improvement;  Patient Goals/Self-Care Activities: Patient will attend all scheduled provider appointments Patient will call provider office for new concerns or questions Patient will have no unplanned hospital readmission within the next 31 days Patient will follow up with surgeon regarding possible interventions for tx of nodule  Follow Up Plan:  Telephone follow up appointment with care management team member scheduled for:  within the month of Aug The patient has been provided with contact information for the care management team and has been advised to call with any health related questions or concerns.          Plan: RN CM discussed with caregiver next outreach within the month of Aug. Caregiver agrees to care plan and follow up. RN CM will send quarterly update to PCP.     Enzo Montgomery, RN,BSN,CCM [place] Management Telephonic Care  Management Coordinator Direct Phone: [phone] Toll Free: [phone] Fax: [phone]

## 2022-02-18 ENCOUNTER — Ambulatory Visit: Payer: Medicare PPO | Admitting: Oncology

## 2022-02-20 NOTE — Progress Notes (Unsigned)
[place]  [address] [place],  [place]  [address] [id number]  Clinic Day:  02/18/2022  Referring physician: Garwin Brothers, MD   HISTORY OF PRESENT ILLNESS:  The patient is a 81 y.o. female with stage IIA (T3 N0 M0) colon cancer, status post a right hemicolectomy in December 2014.  The patient also has a history of stage IVA mantle cell lymphoma, for which she underwent 6 cycles of bendamustine/Rituxan, followed by 2 years of maintenance Rituxan, which were completed in March 2014.  She comes in today to go over her PET scan images, which were done after a CT scan in October 2022 showed a right-sided pleural effusion and multiple pulmonary nodules.  Over the past month, the patient has been doing better.  She denies having any further shortness of breath.  She also denies having any right chest wall pain, a productive cough, or hemoptysis.    PHYSICAL EXAM:  There were no vitals taken for this visit. Wt Readings from Last 3 Encounters:  07/29/21 133 lb 6.4 oz (60.5 kg)  07/07/21 132 lb 4.8 oz (60 kg)  04/07/21 143 lb 9.6 oz (65.1 kg)   There is no height or weight on file to calculate BMI. Performance status (ECOG): 1 - Symptomatic but completely ambulatory Physical Exam Constitutional:      Appearance: Normal appearance. She is not ill-appearing.  HENT:     Mouth/Throat:     Mouth: Mucous membranes are moist.     Pharynx: Oropharynx is clear. No oropharyngeal exudate or posterior oropharyngeal erythema.  Cardiovascular:     Rate and Rhythm: Normal rate and regular rhythm.     Heart sounds: No murmur heard.    No friction rub. No gallop.  Pulmonary:     Effort: Pulmonary effort is normal. No respiratory distress.     Breath sounds: Normal breath sounds. No decreased breath sounds, wheezing, rhonchi or rales.  Abdominal:     General: Bowel sounds are normal. There is no distension.     Palpations: Abdomen is soft. There is no mass.      Tenderness: There is no abdominal tenderness.  Musculoskeletal:        General: No swelling.     Right lower leg: No edema.     Left lower leg: No edema.  Lymphadenopathy:     Cervical: No cervical adenopathy.     Upper Body:     Right upper body: No supraclavicular or axillary adenopathy.     Left upper body: No supraclavicular or axillary adenopathy.     Lower Body: No right inguinal adenopathy. No left inguinal adenopathy.  Skin:    General: Skin is warm.     Coloration: Skin is not jaundiced.     Findings: No lesion or rash.  Neurological:     General: No focal deficit present.     Mental Status: She is alert and oriented to person, place, and time. Mental status is at baseline.  Psychiatric:        Mood and Affect: Mood normal.        Behavior: Behavior normal.        Thought Content: Thought content normal.    SCANS:  Her chest CT in May 2023 revealed the following: FINDINGS: Cardiovascular: Right IJ Port-A-Cath extends to the superior cavoatrial junction. No acute vascular findings are seen. There is extensive atherosclerosis of the aorta, great vessels and coronary arteries with irregular intimal plaque formation in the aortic  arch and descending aorta. There is stable dilatation of the ascending aorta which measures up to 4.1 cm in diameter. The heart size is normal. There is no pericardial effusion.  Mediastinum/Nodes: There are no enlarged mediastinal, hilar or axillary lymph nodes.Small hiatal hernia. The thyroid gland and trachea appear unremarkable.  Lungs/Pleura: Previously demonstrated right pleural effusion has resolved. There is improved aeration of both lungs, especially the right lower lobe. Scattered areas of residual subpleural scarring or atelectasis are present in the right lower lobe. Interval improvement in the previously demonstrated right lung nodularity. A right upper lobe lesion measuring approximately 7 x 4 mm on image 21/301 previously  measured up to 8 mm in diameter. Additional small right lung nodules are present measuring 3 mm in the upper lobe (image 42/301) and 6 mm in the lower lobe (image 62/301). Small subpleural left lower lobe nodule measuring 4 mm on image 68/301 is unchanged. No new or enlarging nodules identified.  Upper abdomen: The visualized upper abdomen appears stable without suspicious findings. Stable small cystic renal lesions bilaterally.  Musculoskeletal/Chest wall: There is no chest wall mass or suspicious osseous finding.  IMPRESSION: 1. No recurrent right pleural effusion or increasing right lung nodularity. Previously demonstrated pulmonary nodules are stable to improved from the most recent PET-CT of 6 months ago. 2. No new or enlarging pulmonary nodules or thoracic adenopathy. 3. Extensive coronary and Aortic Atherosclerosis (ICD10-I70.0).  ASSESSMENT & PLAN:  Assessment/Plan:  An 81 y.o. female with a remote history of both mantle cell lymphoma and colon cancer.  In clinic today, I went over her PET scan images with her, for which she could see no hypermetabolic activity was present.  Furthermore, virtually all of her right pleural effusion and pulmonary nodules have essentially dissipated.  Understandably, the patient and her family were pleased with the images.  These findings suggest there is no recurrence of either her mantle cell lymphoma or her colon cancer.  Clinically, she is doing well.  For completeness, I will repeat a chest CT in 6 months to ensure nothing has reformed in her right hemithorax.  I will see her back the following day to go over the images and their implications.  The patient understands all the plans discussed today and is in agreement with them.    I, Rita Ohara, am acting as scribe for Marice Potter, MD    I have reviewed this report as typed by the medical scribe, and it is complete and accurate.  Sunita Demond Macarthur Critchley, MD

## 2022-02-21 ENCOUNTER — Other Ambulatory Visit: Payer: Self-pay | Admitting: Oncology

## 2022-02-21 ENCOUNTER — Telehealth: Payer: Self-pay | Admitting: Oncology

## 2022-02-21 ENCOUNTER — Inpatient Hospital Stay: Payer: Medicare HMO | Attending: Oncology | Admitting: Oncology

## 2022-02-21 VITALS — BP 163/72 | HR 70 | Temp 97.8°F | Resp 14 | Ht 61.0 in | Wt 136.5 lb

## 2022-02-21 DIAGNOSIS — C8318 Mantle cell lymphoma, lymph nodes of multiple sites: Secondary | ICD-10-CM | POA: Diagnosis not present

## 2022-02-21 NOTE — Telephone Encounter (Signed)
Per 02/21/22 los next appt scheduled and confirmed with patient

## 2022-03-04 DIAGNOSIS — Z131 Encounter for screening for diabetes mellitus: Secondary | ICD-10-CM | POA: Diagnosis not present

## 2022-03-04 DIAGNOSIS — Z8572 Personal history of non-Hodgkin lymphomas: Secondary | ICD-10-CM | POA: Diagnosis not present

## 2022-03-04 DIAGNOSIS — Z85038 Personal history of other malignant neoplasm of large intestine: Secondary | ICD-10-CM | POA: Diagnosis not present

## 2022-03-04 DIAGNOSIS — E782 Mixed hyperlipidemia: Secondary | ICD-10-CM | POA: Diagnosis not present

## 2022-03-04 DIAGNOSIS — Z1331 Encounter for screening for depression: Secondary | ICD-10-CM | POA: Diagnosis not present

## 2022-03-04 DIAGNOSIS — Z0000 Encounter for general adult medical examination without abnormal findings: Secondary | ICD-10-CM | POA: Diagnosis not present

## 2022-03-04 DIAGNOSIS — I10 Essential (primary) hypertension: Secondary | ICD-10-CM | POA: Diagnosis not present

## 2022-03-04 DIAGNOSIS — Z6825 Body mass index (BMI) 25.0-25.9, adult: Secondary | ICD-10-CM | POA: Diagnosis not present

## 2022-03-15 IMAGING — US US THYROID
1 series · 13 of 25 positions shown · non-contrast
Comparison: 01/24/2018 and previous

CLINICAL DATA: Nodules .  History of thyroid biopsy 12/14/2006

EXAM:
THYROID ULTRASOUND
TECHNIQUE: Ultrasound examination of the thyroid gland and adjacent soft
tissues was performed.

[Series 1: us thyroid · 0.06mm/px · 13 of 48 slices shown]
[im 1/48]
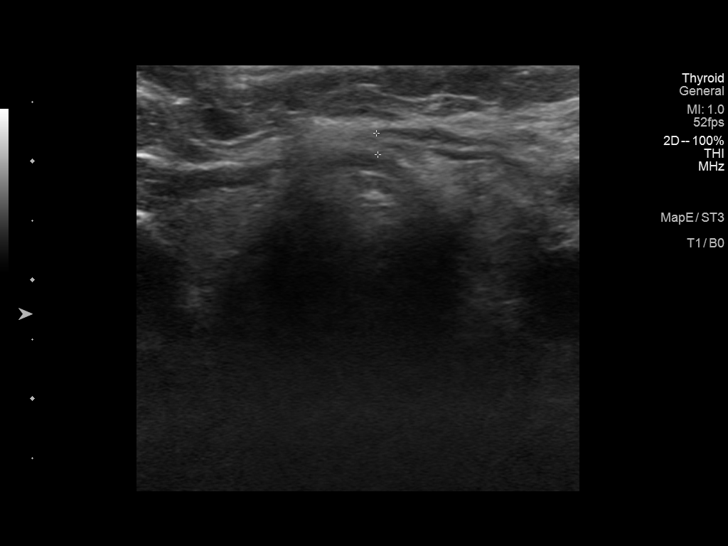
[im 4/48]
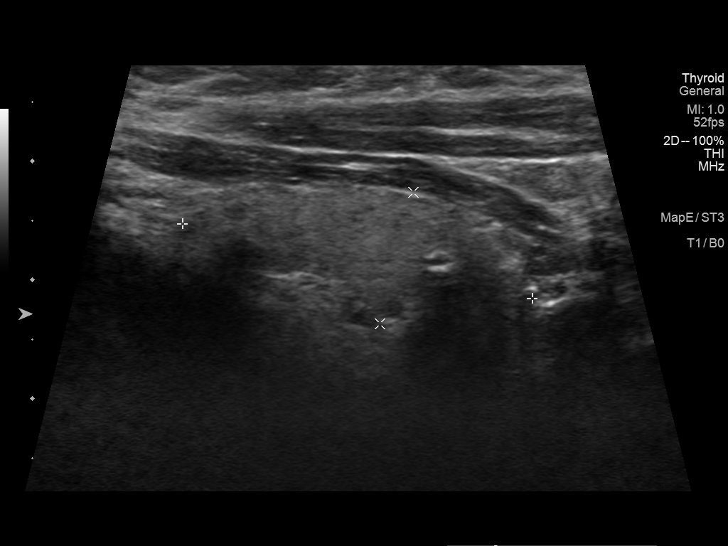
[im 8/48]
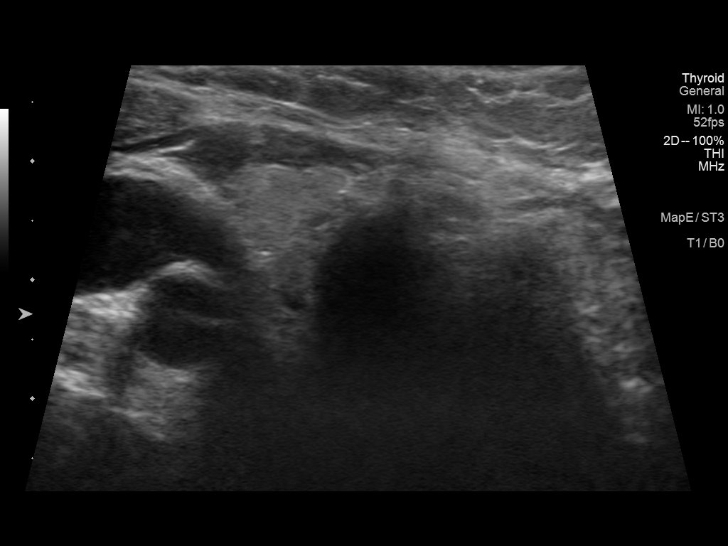
[im 12/48]
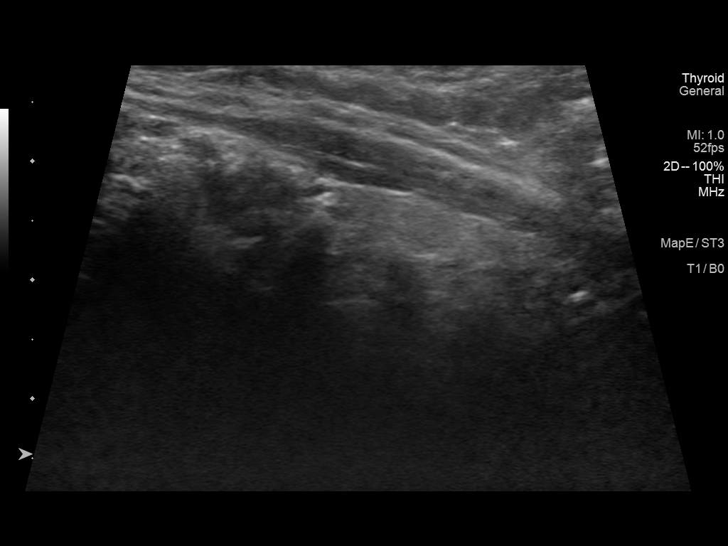
[im 16/48]
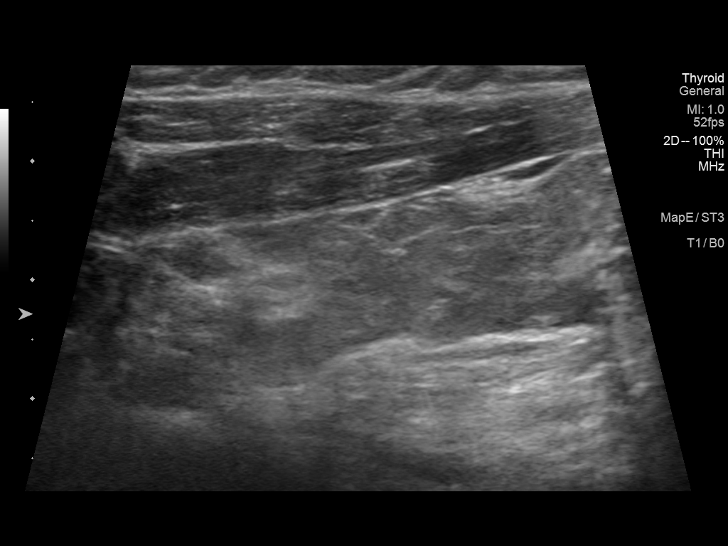
[im 20/48]
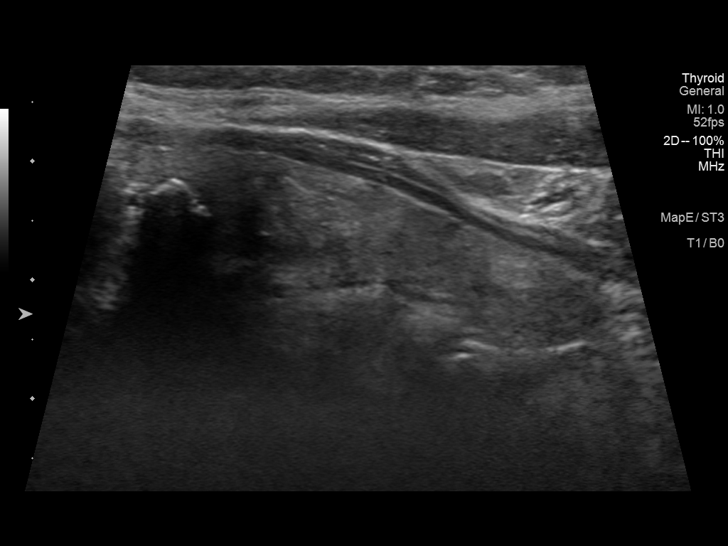
[im 24/48]
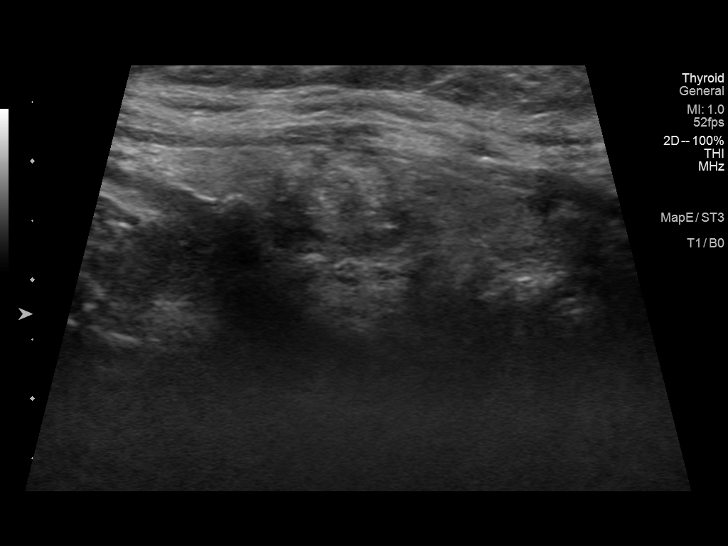
[im 28/48]
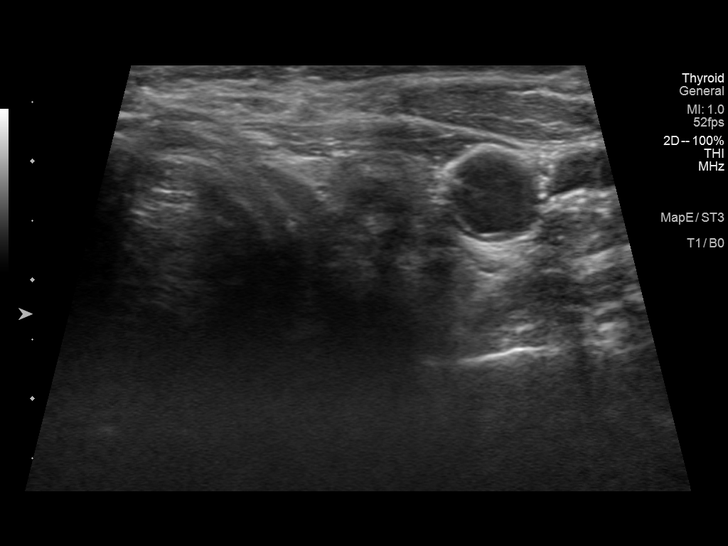
[im 32/48]
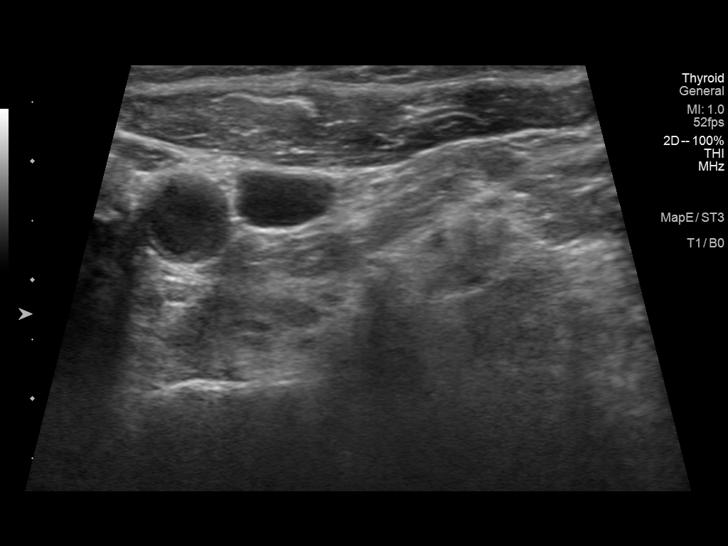
[im 36/48]
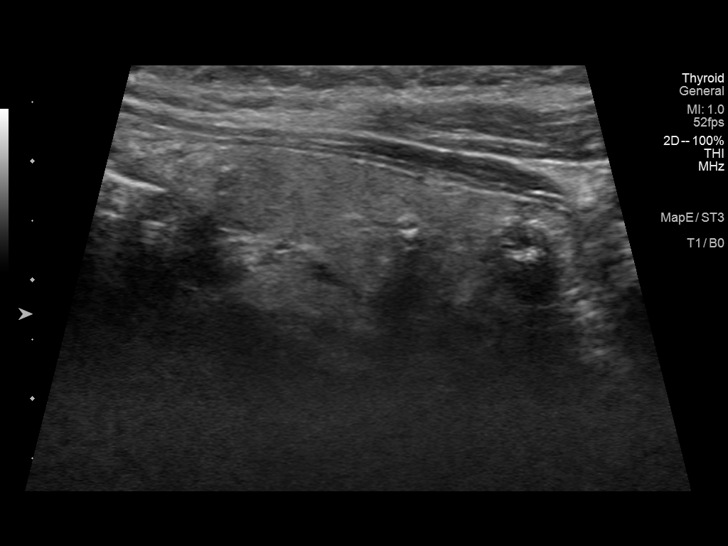
[im 40/48]
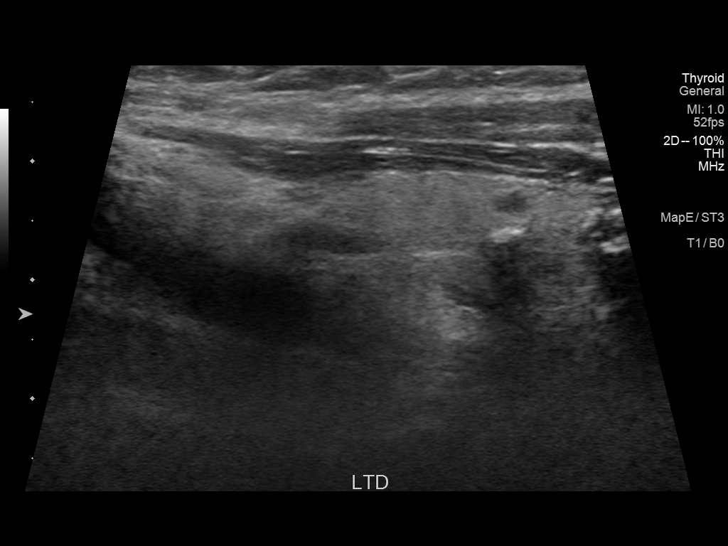
[im 44/48]
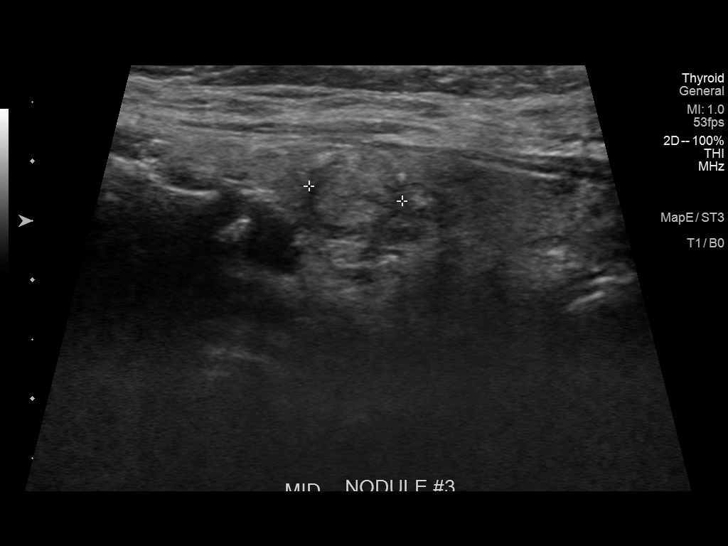
[im 48/48]
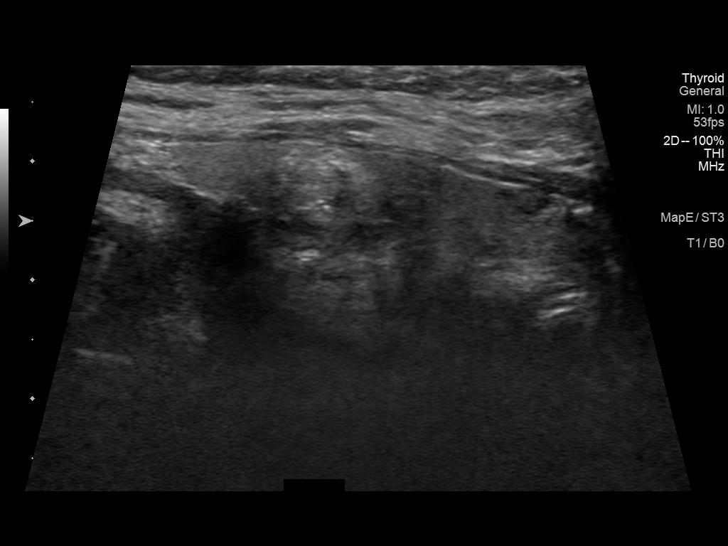

[13 of 25 positions shown; findings below may reference images not displayed]

FINDINGS: Parenchymal Echotexture: Moderately heterogenous

Isthmus: 0.2 cm thickness, stable

Right lobe: 3 x 1.1 x 1.1 cm, previously 3.2 x 0.8 x

Left lobe: 3.8 x 0.9 x 1 cm, previously 3.2 x 0.8 x

_________________________________________________________

Estimated total number of nodules >/= 1 cm: 0

Number of spongiform nodules >/=  2 cm not described below (TR1): 0

Number of mixed cystic and solid nodules >/= 1.5 cm not described
below (TR2): In choose 1

_________________________________________________________

Nodule # 1: 0.6 cm inferior right nodule with coarse calcifications,
previously 0.7; This nodule does NOT meet TI-RADS criteria for
biopsy or dedicated follow-up.

Nodule # 2: 0.6 cm inferior right nodule with peripheral
calcification, previously 0.8; This nodule does NOT meet TI-RADS
criteria for biopsy or dedicated follow-up.

Nodule # 3: 0.8 cm isoechoic nodule without calcifications, mid
left, stable; This nodule does NOT meet TI-RADS criteria for biopsy
or dedicated follow-up.
IMPRESSION: 1. Small heterogenous thyroid with stable subcentimeter nodules.
None meets criteria for biopsy or follow-up.

The above is in keeping with the ACR TI-RADS recommendations - [HOSPITAL] 0480;[DATE].

## 2022-04-15 ENCOUNTER — Other Ambulatory Visit: Payer: Self-pay

## 2022-04-15 NOTE — Patient Outreach (Signed)
[facility]) Care Management  04/15/2022  PANZY BUBECK 1941-06-19 [id number]   Telephone Assessment   Unsuccessful outreach attempt to patient. Patient has continued to improve and last oncology appt shows disease stable and follow up in a year. Goals met.      Plan: RN CM will close case.  Enzo Montgomery, RN,BSN,CCM [facility] Telephonic Care Management Coordinator Direct Phone: [phone] Toll Free: [phone] Fax: [phone]

## 2022-06-06 DIAGNOSIS — J302 Other seasonal allergic rhinitis: Secondary | ICD-10-CM | POA: Diagnosis not present

## 2022-06-06 DIAGNOSIS — E782 Mixed hyperlipidemia: Secondary | ICD-10-CM | POA: Diagnosis not present

## 2022-06-06 DIAGNOSIS — R899 Unspecified abnormal finding in specimens from other organs, systems and tissues: Secondary | ICD-10-CM | POA: Diagnosis not present

## 2022-06-06 DIAGNOSIS — C8590 Non-Hodgkin lymphoma, unspecified, unspecified site: Secondary | ICD-10-CM | POA: Diagnosis not present

## 2022-06-06 DIAGNOSIS — I10 Essential (primary) hypertension: Secondary | ICD-10-CM | POA: Diagnosis not present

## 2022-06-14 ENCOUNTER — Other Ambulatory Visit: Payer: Self-pay | Admitting: Internal Medicine

## 2022-06-14 DIAGNOSIS — R899 Unspecified abnormal finding in specimens from other organs, systems and tissues: Secondary | ICD-10-CM | POA: Diagnosis not present

## 2022-06-15 LAB — TSH: TSH: 3.95 u[IU]/mL (ref 0.450–4.500)

## 2022-06-15 LAB — PARATHYROID HORMONE, INTACT (NO CA): PTH: 70 pg/mL — ABNORMAL HIGH (ref 15–65)

## 2022-06-15 LAB — VITAMIN D 25 HYDROXY (VIT D DEFICIENCY, FRACTURES): Vit D, 25-Hydroxy: 34.4 ng/mL (ref 30.0–100.0)

## 2022-08-17 ENCOUNTER — Other Ambulatory Visit: Payer: Self-pay

## 2022-08-17 MED ORDER — FESOTERODINE FUMARATE ER 4 MG PO TB24: 4.0000 mg | ORAL_TABLET | Freq: Every day | ORAL | 1 refills | Status: DC

## 2022-08-21 DIAGNOSIS — Z85038 Personal history of other malignant neoplasm of large intestine: Secondary | ICD-10-CM | POA: Diagnosis not present

## 2022-08-21 DIAGNOSIS — I519 Heart disease, unspecified: Secondary | ICD-10-CM | POA: Diagnosis not present

## 2022-08-21 DIAGNOSIS — R7989 Other specified abnormal findings of blood chemistry: Secondary | ICD-10-CM | POA: Diagnosis not present

## 2022-08-21 DIAGNOSIS — R296 Repeated falls: Secondary | ICD-10-CM | POA: Diagnosis not present

## 2022-08-21 DIAGNOSIS — I10 Essential (primary) hypertension: Secondary | ICD-10-CM | POA: Diagnosis not present

## 2022-08-21 DIAGNOSIS — J449 Chronic obstructive pulmonary disease, unspecified: Secondary | ICD-10-CM | POA: Diagnosis not present

## 2022-08-21 DIAGNOSIS — E876 Hypokalemia: Secondary | ICD-10-CM | POA: Diagnosis not present

## 2022-08-21 DIAGNOSIS — N289 Disorder of kidney and ureter, unspecified: Secondary | ICD-10-CM | POA: Diagnosis not present

## 2022-08-21 DIAGNOSIS — R54 Age-related physical debility: Secondary | ICD-10-CM | POA: Diagnosis not present

## 2022-08-21 DIAGNOSIS — R0902 Hypoxemia: Secondary | ICD-10-CM | POA: Diagnosis not present

## 2022-08-21 DIAGNOSIS — R0600 Dyspnea, unspecified: Secondary | ICD-10-CM | POA: Diagnosis not present

## 2022-08-21 DIAGNOSIS — R031 Nonspecific low blood-pressure reading: Secondary | ICD-10-CM | POA: Diagnosis not present

## 2022-08-21 DIAGNOSIS — I5021 Acute systolic (congestive) heart failure: Secondary | ICD-10-CM | POA: Diagnosis not present

## 2022-08-21 DIAGNOSIS — Z8249 Family history of ischemic heart disease and other diseases of the circulatory system: Secondary | ICD-10-CM | POA: Diagnosis not present

## 2022-08-21 DIAGNOSIS — R931 Abnormal findings on diagnostic imaging of heart and coronary circulation: Secondary | ICD-10-CM | POA: Diagnosis not present

## 2022-08-21 DIAGNOSIS — I428 Other cardiomyopathies: Secondary | ICD-10-CM | POA: Diagnosis not present

## 2022-08-21 DIAGNOSIS — R262 Difficulty in walking, not elsewhere classified: Secondary | ICD-10-CM | POA: Diagnosis not present

## 2022-08-21 DIAGNOSIS — E875 Hyperkalemia: Secondary | ICD-10-CM | POA: Diagnosis not present

## 2022-08-21 DIAGNOSIS — R911 Solitary pulmonary nodule: Secondary | ICD-10-CM | POA: Diagnosis not present

## 2022-08-21 DIAGNOSIS — Z833 Family history of diabetes mellitus: Secondary | ICD-10-CM | POA: Diagnosis not present

## 2022-08-21 DIAGNOSIS — E860 Dehydration: Secondary | ICD-10-CM | POA: Diagnosis not present

## 2022-08-21 DIAGNOSIS — C8310 Mantle cell lymphoma, unspecified site: Secondary | ICD-10-CM | POA: Diagnosis not present

## 2022-08-21 DIAGNOSIS — Z79899 Other long term (current) drug therapy: Secondary | ICD-10-CM | POA: Diagnosis not present

## 2022-08-21 DIAGNOSIS — I110 Hypertensive heart disease with heart failure: Secondary | ICD-10-CM | POA: Diagnosis not present

## 2022-08-21 DIAGNOSIS — R079 Chest pain, unspecified: Secondary | ICD-10-CM | POA: Diagnosis not present

## 2022-08-21 DIAGNOSIS — E063 Autoimmune thyroiditis: Secondary | ICD-10-CM | POA: Diagnosis not present

## 2022-08-21 DIAGNOSIS — I2510 Atherosclerotic heart disease of native coronary artery without angina pectoris: Secondary | ICD-10-CM | POA: Diagnosis not present

## 2022-08-21 DIAGNOSIS — N179 Acute kidney failure, unspecified: Secondary | ICD-10-CM | POA: Diagnosis not present

## 2022-08-21 DIAGNOSIS — E119 Type 2 diabetes mellitus without complications: Secondary | ICD-10-CM | POA: Diagnosis not present

## 2022-08-21 DIAGNOSIS — I214 Non-ST elevation (NSTEMI) myocardial infarction: Secondary | ICD-10-CM | POA: Diagnosis not present

## 2022-08-21 DIAGNOSIS — E7849 Other hyperlipidemia: Secondary | ICD-10-CM | POA: Diagnosis not present

## 2022-08-21 DIAGNOSIS — R000 Tachycardia, unspecified: Secondary | ICD-10-CM | POA: Diagnosis not present

## 2022-08-21 DIAGNOSIS — R918 Other nonspecific abnormal finding of lung field: Secondary | ICD-10-CM | POA: Diagnosis not present

## 2022-08-21 DIAGNOSIS — I5043 Acute on chronic combined systolic (congestive) and diastolic (congestive) heart failure: Secondary | ICD-10-CM | POA: Diagnosis not present

## 2022-08-21 DIAGNOSIS — I429 Cardiomyopathy, unspecified: Secondary | ICD-10-CM | POA: Diagnosis not present

## 2022-08-21 DIAGNOSIS — I5041 Acute combined systolic (congestive) and diastolic (congestive) heart failure: Secondary | ICD-10-CM | POA: Diagnosis not present

## 2022-08-21 DIAGNOSIS — M810 Age-related osteoporosis without current pathological fracture: Secondary | ICD-10-CM | POA: Diagnosis not present

## 2022-08-21 DIAGNOSIS — E782 Mixed hyperlipidemia: Secondary | ICD-10-CM | POA: Diagnosis not present

## 2022-08-21 DIAGNOSIS — E785 Hyperlipidemia, unspecified: Secondary | ICD-10-CM | POA: Diagnosis not present

## 2022-08-21 DIAGNOSIS — R5383 Other fatigue: Secondary | ICD-10-CM | POA: Diagnosis not present

## 2022-08-21 DIAGNOSIS — I959 Hypotension, unspecified: Secondary | ICD-10-CM | POA: Diagnosis not present

## 2022-08-21 DIAGNOSIS — Z7982 Long term (current) use of aspirin: Secondary | ICD-10-CM | POA: Diagnosis not present

## 2022-08-22 DIAGNOSIS — I429 Cardiomyopathy, unspecified: Secondary | ICD-10-CM

## 2022-08-22 DIAGNOSIS — I214 Non-ST elevation (NSTEMI) myocardial infarction: Secondary | ICD-10-CM | POA: Diagnosis not present

## 2022-08-22 DIAGNOSIS — E860 Dehydration: Secondary | ICD-10-CM

## 2022-08-22 DIAGNOSIS — I519 Heart disease, unspecified: Secondary | ICD-10-CM

## 2022-08-22 DIAGNOSIS — N179 Acute kidney failure, unspecified: Secondary | ICD-10-CM

## 2022-08-22 DIAGNOSIS — R031 Nonspecific low blood-pressure reading: Secondary | ICD-10-CM

## 2022-08-22 DIAGNOSIS — R7989 Other specified abnormal findings of blood chemistry: Secondary | ICD-10-CM

## 2022-08-22 DIAGNOSIS — J449 Chronic obstructive pulmonary disease, unspecified: Secondary | ICD-10-CM

## 2022-08-22 DIAGNOSIS — I2510 Atherosclerotic heart disease of native coronary artery without angina pectoris: Secondary | ICD-10-CM

## 2022-08-23 DIAGNOSIS — I519 Heart disease, unspecified: Secondary | ICD-10-CM | POA: Diagnosis not present

## 2022-08-23 DIAGNOSIS — R7989 Other specified abnormal findings of blood chemistry: Secondary | ICD-10-CM | POA: Diagnosis not present

## 2022-08-23 DIAGNOSIS — I2510 Atherosclerotic heart disease of native coronary artery without angina pectoris: Secondary | ICD-10-CM | POA: Diagnosis not present

## 2022-08-23 DIAGNOSIS — I429 Cardiomyopathy, unspecified: Secondary | ICD-10-CM | POA: Diagnosis not present

## 2022-08-24 DIAGNOSIS — I429 Cardiomyopathy, unspecified: Secondary | ICD-10-CM | POA: Diagnosis not present

## 2022-08-24 DIAGNOSIS — I2510 Atherosclerotic heart disease of native coronary artery without angina pectoris: Secondary | ICD-10-CM | POA: Diagnosis not present

## 2022-08-24 DIAGNOSIS — I519 Heart disease, unspecified: Secondary | ICD-10-CM | POA: Diagnosis not present

## 2022-08-24 DIAGNOSIS — R7989 Other specified abnormal findings of blood chemistry: Secondary | ICD-10-CM | POA: Diagnosis not present

## 2022-08-25 ENCOUNTER — Encounter (HOSPITAL_COMMUNITY)
Admission: AD | Disposition: A | Discharge status: Skilled Nursing Facility | Payer: Self-pay | Source: Other Acute Inpatient Hospital | Attending: Interventional Cardiology

## 2022-08-25 ENCOUNTER — Other Ambulatory Visit: Payer: Self-pay

## 2022-08-25 ENCOUNTER — Inpatient Hospital Stay (HOSPITAL_COMMUNITY)
Admission: AD | Admit: 2022-08-25 | Discharge: 2022-08-29 | DRG: 280 | Disposition: A | Discharge status: Skilled Nursing Facility | Payer: Medicare HMO | Source: Other Acute Inpatient Hospital | Attending: Interventional Cardiology | Admitting: Interventional Cardiology

## 2022-08-25 DIAGNOSIS — E875 Hyperkalemia: Secondary | ICD-10-CM | POA: Diagnosis present

## 2022-08-25 DIAGNOSIS — Z7982 Long term (current) use of aspirin: Secondary | ICD-10-CM

## 2022-08-25 DIAGNOSIS — R0602 Shortness of breath: Secondary | ICD-10-CM | POA: Diagnosis not present

## 2022-08-25 DIAGNOSIS — I5043 Acute on chronic combined systolic (congestive) and diastolic (congestive) heart failure: Secondary | ICD-10-CM | POA: Diagnosis present

## 2022-08-25 DIAGNOSIS — I428 Other cardiomyopathies: Secondary | ICD-10-CM | POA: Diagnosis not present

## 2022-08-25 DIAGNOSIS — I110 Hypertensive heart disease with heart failure: Secondary | ICD-10-CM | POA: Diagnosis present

## 2022-08-25 DIAGNOSIS — Z8249 Family history of ischemic heart disease and other diseases of the circulatory system: Secondary | ICD-10-CM

## 2022-08-25 DIAGNOSIS — Z79899 Other long term (current) drug therapy: Secondary | ICD-10-CM

## 2022-08-25 DIAGNOSIS — E876 Hypokalemia: Secondary | ICD-10-CM | POA: Diagnosis present

## 2022-08-25 DIAGNOSIS — E860 Dehydration: Secondary | ICD-10-CM | POA: Diagnosis present

## 2022-08-25 DIAGNOSIS — C8310 Mantle cell lymphoma, unspecified site: Secondary | ICD-10-CM | POA: Diagnosis present

## 2022-08-25 DIAGNOSIS — E063 Autoimmune thyroiditis: Secondary | ICD-10-CM | POA: Diagnosis present

## 2022-08-25 DIAGNOSIS — I5021 Acute systolic (congestive) heart failure: Secondary | ICD-10-CM | POA: Diagnosis not present

## 2022-08-25 DIAGNOSIS — R0902 Hypoxemia: Secondary | ICD-10-CM | POA: Diagnosis present

## 2022-08-25 DIAGNOSIS — Z85038 Personal history of other malignant neoplasm of large intestine: Secondary | ICD-10-CM

## 2022-08-25 DIAGNOSIS — R931 Abnormal findings on diagnostic imaging of heart and coronary circulation: Secondary | ICD-10-CM | POA: Diagnosis present

## 2022-08-25 DIAGNOSIS — R5383 Other fatigue: Secondary | ICD-10-CM | POA: Diagnosis not present

## 2022-08-25 DIAGNOSIS — I2510 Atherosclerotic heart disease of native coronary artery without angina pectoris: Secondary | ICD-10-CM | POA: Diagnosis present

## 2022-08-25 DIAGNOSIS — R262 Difficulty in walking, not elsewhere classified: Secondary | ICD-10-CM | POA: Diagnosis present

## 2022-08-25 DIAGNOSIS — J449 Chronic obstructive pulmonary disease, unspecified: Secondary | ICD-10-CM | POA: Diagnosis present

## 2022-08-25 DIAGNOSIS — M810 Age-related osteoporosis without current pathological fracture: Secondary | ICD-10-CM | POA: Diagnosis present

## 2022-08-25 DIAGNOSIS — N179 Acute kidney failure, unspecified: Secondary | ICD-10-CM | POA: Diagnosis present

## 2022-08-25 DIAGNOSIS — I10 Essential (primary) hypertension: Secondary | ICD-10-CM | POA: Diagnosis not present

## 2022-08-25 DIAGNOSIS — I214 Non-ST elevation (NSTEMI) myocardial infarction: Secondary | ICD-10-CM | POA: Diagnosis present

## 2022-08-25 DIAGNOSIS — Z833 Family history of diabetes mellitus: Secondary | ICD-10-CM | POA: Diagnosis not present

## 2022-08-25 DIAGNOSIS — I429 Cardiomyopathy, unspecified: Secondary | ICD-10-CM | POA: Diagnosis not present

## 2022-08-25 DIAGNOSIS — E119 Type 2 diabetes mellitus without complications: Secondary | ICD-10-CM | POA: Diagnosis present

## 2022-08-25 DIAGNOSIS — E782 Mixed hyperlipidemia: Secondary | ICD-10-CM | POA: Diagnosis present

## 2022-08-25 DIAGNOSIS — Z7401 Bed confinement status: Secondary | ICD-10-CM | POA: Diagnosis not present

## 2022-08-25 DIAGNOSIS — R531 Weakness: Secondary | ICD-10-CM | POA: Diagnosis not present

## 2022-08-25 DIAGNOSIS — R296 Repeated falls: Secondary | ICD-10-CM | POA: Diagnosis present

## 2022-08-25 DIAGNOSIS — E7849 Other hyperlipidemia: Secondary | ICD-10-CM | POA: Diagnosis not present

## 2022-08-25 DIAGNOSIS — R7989 Other specified abnormal findings of blood chemistry: Secondary | ICD-10-CM | POA: Diagnosis not present

## 2022-08-25 DIAGNOSIS — R54 Age-related physical debility: Secondary | ICD-10-CM | POA: Diagnosis present

## 2022-08-25 DIAGNOSIS — I5041 Acute combined systolic (congestive) and diastolic (congestive) heart failure: Secondary | ICD-10-CM | POA: Diagnosis not present

## 2022-08-25 HISTORY — DX: Abnormal findings on diagnostic imaging of heart and coronary circulation: R93.1

## 2022-08-25 HISTORY — DX: Non-ST elevation (NSTEMI) myocardial infarction: I21.4

## 2022-08-25 HISTORY — PX: LEFT HEART CATH AND CORONARY ANGIOGRAPHY: CATH118249

## 2022-08-25 LAB — CBC
HCT: 37.1 % (ref 36.0–46.0)
Hemoglobin: 12.4 g/dL (ref 12.0–15.0)
MCH: 29 pg (ref 26.0–34.0)
MCHC: 33.4 g/dL (ref 30.0–36.0)
MCV: 86.9 fL (ref 80.0–100.0)
Platelets: 204 10*3/uL (ref 150–400)
RBC: 4.27 MIL/uL (ref 3.87–5.11)
RDW: 13 % (ref 11.5–15.5)
WBC: 5.5 10*3/uL (ref 4.0–10.5)
nRBC: 0 % (ref 0.0–0.2)

## 2022-08-25 LAB — CREATININE, SERUM
Creatinine, Ser: 1.11 mg/dL — ABNORMAL HIGH (ref 0.44–1.00)
GFR, Estimated: 50 mL/min — ABNORMAL LOW

## 2022-08-25 SURGERY — LEFT HEART CATH AND CORONARY ANGIOGRAPHY
Anesthesia: LOCAL

## 2022-08-25 MED ORDER — SODIUM CHLORIDE 0.9 % IV SOLN
INTRAVENOUS | Status: AC | PRN
  Administered 2022-08-25: 10 mL/h via INTRAVENOUS

## 2022-08-25 MED ORDER — FENTANYL CITRATE (PF) 100 MCG/2ML IJ SOLN
INTRAMUSCULAR | Status: AC
  Filled 2022-08-25: qty 2

## 2022-08-25 MED ORDER — ATORVASTATIN CALCIUM 40 MG PO TABS
40.0000 mg | ORAL_TABLET | Freq: Every day | ORAL | Status: DC
  Administered 2022-08-25 – 2022-08-29 (×5): 40 mg via ORAL
  Filled 2022-08-25 (×5): qty 1

## 2022-08-25 MED ORDER — HEPARIN (PORCINE) IN NACL 1000-0.9 UT/500ML-% IV SOLN
INTRAVENOUS | Status: DC | PRN
  Administered 2022-08-25 (×2): 500 mL

## 2022-08-25 MED ORDER — HEPARIN SODIUM (PORCINE) 1000 UNIT/ML IJ SOLN
INTRAMUSCULAR | Status: DC | PRN
  Administered 2022-08-25: 3000 [IU] via INTRAVENOUS

## 2022-08-25 MED ORDER — ONDANSETRON HCL 4 MG/2ML IJ SOLN: 4.0000 mg | Freq: Four times a day (QID) | INTRAMUSCULAR | Status: DC | PRN

## 2022-08-25 MED ORDER — VERAPAMIL HCL 2.5 MG/ML IV SOLN
INTRAVENOUS | Status: DC | PRN
  Administered 2022-08-25: 2 mg via INTRA_ARTERIAL

## 2022-08-25 MED ORDER — ASPIRIN 81 MG PO CHEW: 81.0000 mg | CHEWABLE_TABLET | Freq: Every day | ORAL | Status: DC

## 2022-08-25 MED ORDER — HEPARIN (PORCINE) IN NACL 1000-0.9 UT/500ML-% IV SOLN
INTRAVENOUS | Status: AC
  Filled 2022-08-25: qty 500

## 2022-08-25 MED ORDER — IOHEXOL 350 MG/ML SOLN
INTRAVENOUS | Status: DC | PRN
  Administered 2022-08-25: 40 mL

## 2022-08-25 MED ORDER — LIDOCAINE HCL (PF) 1 % IJ SOLN
INTRAMUSCULAR | Status: DC | PRN
  Administered 2022-08-25: 2 mL

## 2022-08-25 MED ORDER — ACETAMINOPHEN 325 MG PO TABS: 650.0000 mg | ORAL_TABLET | ORAL | Status: DC | PRN

## 2022-08-25 MED ORDER — NITROGLYCERIN 0.4 MG SL SUBL: 0.4000 mg | SUBLINGUAL_TABLET | SUBLINGUAL | Status: DC | PRN

## 2022-08-25 MED ORDER — ENOXAPARIN SODIUM 40 MG/0.4ML IJ SOSY
40.0000 mg | PREFILLED_SYRINGE | INTRAMUSCULAR | Status: DC
  Administered 2022-08-25 – 2022-08-28 (×4): 40 mg via SUBCUTANEOUS
  Filled 2022-08-25 (×4): qty 0.4

## 2022-08-25 MED ORDER — SODIUM CHLORIDE 0.9% FLUSH: 3.0000 mL | Freq: Two times a day (BID) | INTRAVENOUS | Status: DC

## 2022-08-25 MED ORDER — MIDAZOLAM HCL 2 MG/2ML IJ SOLN
INTRAMUSCULAR | Status: AC
  Filled 2022-08-25: qty 2

## 2022-08-25 MED ORDER — HEPARIN SODIUM (PORCINE) 1000 UNIT/ML IJ SOLN
INTRAMUSCULAR | Status: AC
  Filled 2022-08-25: qty 10

## 2022-08-25 MED ORDER — FESOTERODINE FUMARATE ER 4 MG PO TB24
4.0000 mg | ORAL_TABLET | Freq: Every day | ORAL | Status: DC
  Administered 2022-08-26 – 2022-08-29 (×2): 4 mg via ORAL
  Filled 2022-08-25 (×5): qty 1

## 2022-08-25 MED ORDER — LABETALOL HCL 5 MG/ML IV SOLN: 10.0000 mg | INTRAVENOUS | Status: AC | PRN

## 2022-08-25 MED ORDER — VERAPAMIL HCL 2.5 MG/ML IV SOLN
INTRAVENOUS | Status: AC
  Filled 2022-08-25: qty 2

## 2022-08-25 MED ORDER — SODIUM CHLORIDE 0.9% FLUSH: 3.0000 mL | INTRAVENOUS | Status: DC | PRN

## 2022-08-25 MED ORDER — SODIUM CHLORIDE 0.9% FLUSH
3.0000 mL | Freq: Two times a day (BID) | INTRAVENOUS | Status: DC
  Administered 2022-08-26 – 2022-08-29 (×7): 3 mL via INTRAVENOUS

## 2022-08-25 MED ORDER — EZETIMIBE 10 MG PO TABS
10.0000 mg | ORAL_TABLET | Freq: Every day | ORAL | Status: DC
  Administered 2022-08-25 – 2022-08-29 (×5): 10 mg via ORAL
  Filled 2022-08-25 (×5): qty 1

## 2022-08-25 MED ORDER — PANTOPRAZOLE SODIUM 40 MG PO TBEC
40.0000 mg | DELAYED_RELEASE_TABLET | Freq: Every day | ORAL | Status: DC
  Administered 2022-08-25 – 2022-08-29 (×5): 40 mg via ORAL
  Filled 2022-08-25 (×5): qty 1

## 2022-08-25 MED ORDER — LIDOCAINE HCL (PF) 1 % IJ SOLN
INTRAMUSCULAR | Status: AC
  Filled 2022-08-25: qty 30

## 2022-08-25 MED ORDER — CARVEDILOL 6.25 MG PO TABS
6.2500 mg | ORAL_TABLET | Freq: Two times a day (BID) | ORAL | Status: DC
  Administered 2022-08-25 – 2022-08-29 (×8): 6.25 mg via ORAL
  Filled 2022-08-25 (×8): qty 1

## 2022-08-25 MED ORDER — HYDRALAZINE HCL 20 MG/ML IJ SOLN: 10.0000 mg | INTRAMUSCULAR | Status: AC | PRN

## 2022-08-25 MED ORDER — VERAPAMIL HCL 2.5 MG/ML IV SOLN
INTRAVENOUS | Status: DC | PRN
  Administered 2022-08-25: 10 mL via INTRA_ARTERIAL

## 2022-08-25 MED ORDER — ASPIRIN 81 MG PO TBEC
81.0000 mg | DELAYED_RELEASE_TABLET | Freq: Every day | ORAL | Status: DC
  Administered 2022-08-26 – 2022-08-29 (×4): 81 mg via ORAL
  Filled 2022-08-25 (×4): qty 1

## 2022-08-25 MED ORDER — PRAVASTATIN SODIUM 40 MG PO TABS: 40.0000 mg | ORAL_TABLET | Freq: Every day | ORAL | Status: DC

## 2022-08-25 MED ORDER — SODIUM CHLORIDE 0.9 % IV SOLN: 250.0000 mL | INTRAVENOUS | Status: DC | PRN

## 2022-08-25 MED ORDER — MIDAZOLAM HCL 2 MG/2ML IJ SOLN
INTRAMUSCULAR | Status: DC | PRN
  Administered 2022-08-25 (×2): 1 mg via INTRAVENOUS

## 2022-08-25 MED ORDER — FENTANYL CITRATE (PF) 100 MCG/2ML IJ SOLN
INTRAMUSCULAR | Status: DC | PRN
  Administered 2022-08-25: 25 ug via INTRAVENOUS

## 2022-08-25 MED ORDER — SODIUM CHLORIDE 0.9 % IV SOLN: INTRAVENOUS | Status: AC

## 2022-08-25 MED ORDER — LORATADINE 10 MG PO TABS
10.0000 mg | ORAL_TABLET | Freq: Every day | ORAL | Status: DC
  Administered 2022-08-26 – 2022-08-29 (×4): 10 mg via ORAL
  Filled 2022-08-25 (×4): qty 1

## 2022-08-25 MED ORDER — SODIUM CHLORIDE 0.9 % WEIGHT BASED INFUSION: 1.0000 mL/kg/h | INTRAVENOUS | Status: AC

## 2022-08-25 MED ORDER — HEPARIN SODIUM (PORCINE) 5000 UNIT/ML IJ SOLN: 5000.0000 [IU] | Freq: Three times a day (TID) | INTRAMUSCULAR | Status: DC

## 2022-08-25 SURGICAL SUPPLY — 12 items
CATH 5FR JL3.5 JR4 ANG PIG MP (CATHETERS) IMPLANT
DEVICE RAD TR BAND REGULAR (VASCULAR PRODUCTS) IMPLANT
GLIDESHEATH SLEND SS 6F .021 (SHEATH) IMPLANT
GUIDEWIRE INQWIRE 1.5J.035X260 (WIRE) IMPLANT
INQWIRE 1.5J .035X260CM (WIRE) ×1
KIT HEART LEFT (KITS) ×1 IMPLANT
PACK CARDIAC CATHETERIZATION (CUSTOM PROCEDURE TRAY) ×1 IMPLANT
SHEATH PROBE COVER 6X72 (BAG) IMPLANT
SYR MEDRAD MARK 7 150ML (SYRINGE) ×1 IMPLANT
TRANSDUCER W/STOPCOCK (MISCELLANEOUS) ×1 IMPLANT
TUBING CIL FLEX 10 FLL-RA (TUBING) ×1 IMPLANT
WIRE HI TORQ VERSACORE-J 145CM (WIRE) IMPLANT

## 2022-08-25 NOTE — Discharge Instructions (Signed)

## 2022-08-25 NOTE — H&P (Addendum)
Cardiology Admission History and Physical   Patient ID: Joy Daniels MRN: [id number]; DOB: 1940/11/07   Admission date: 08/25/2022  PCP:  Garwin Brothers, [place] [facility] Cardiologist:  None        Chief Complaint:  chest pain  Patient Profile:   Joy Daniels is a 81 y.o. female with hx of hyperthyroidism, hypertension, COPD, DM, anemia, mantle cell lymphoma (2011 dx), colon cancer (s/p resection and primary anastomosis) who is being seen 08/25/2022 for LHC due to finding of new systolic CHF.  History of Present Illness:   Joy Daniels presented to [facility] ED on 12/10 with symptoms of constipation x3 days and new onset abdominal pain/chest pain. Patient described poor oral intake over several days secondary to what she felt was constipation. On the day of presentation to the ED, she awoke with abdominal and chest pain as well as shortness of breath. Patient's daughter reported that her mother seem abnormally weak later that afternoon and patient was brought to the ED. It was also noted that patient seemed to be having some memory issue, not normal for her. Prior to the 3 days of constipation, it was reported that patient felt like herself.  An additional notes from [place], it was reported that the patient was apparently found on the floor over the weekend but not after a fall or passing out.  Patient lives with her daughter and it was noted that the patient has not been eating well at home, only drinking chocolate milk shakes and eating donuts.  It was later clarified that the patient seemed to be having left chest pain with tenderness to palpation.  In the ED at [facility], labs notable for pro BNP 24200, Troponin (non-high sensitivity) 0.67, WBC 13.3, Creatinine 2.0. She was also noted to be hypoxic with O2 saturation in the 80s. Repeat troponin 0.62, 0.40. EKG without acute ischemic changes. Patient admitted by hospitalist. CT chest/abd/pelvis wo contrast notable for  new pleural base nodule in LLL measuring 1.8 by 1.6cm with adjacent 0.8 by 0.7 cm subpleural nodularity. Metastatic disease not excluded. New peripheral airspace opacity also noted in posterior upper right lobe inflammatory vs neoplastic.  Coronary atherosclerotic disease also reported on CT.  Hospital notes indicate that the patient received IV Lasix but subsequently had a rather dramatic drop in blood pressure and was started on IV fluids.  Patient also initiated on IV heparin and given aspirin, Coreg.  Patient with decreased LV function, reportedly around 40 to 45% with some regional wall motion abnormality noted (full echo report not transmitted, unable to see specifics) RV dysfunction and RV dilation also noted.  While the initial suspicion was heart failure given elevated BNP, it appeared to the renal hospitalist that patient was dehydrated and she was continued to be managed with IV fluids.  Further diuresis was discontinued.  Patient's physical exam continued to be mixed with small pleural effusions on chest x-ray and crackles noted on lung auscultation.  Appears she also received empiric Rocephin 1g x3 doses. Patient with improving creatinine following IV fluid administration.  On the second day of admission, patient actually became hypertensive following IV fluid and held home losartan 100 mg.  Rate increased to 6.25 mg twice daily.  On 12/13, patient was without cardiac symptoms or complaints.  She appeared to be hemodynamically stable.  However she did continue to have tenderness to palpation on left chest wall. Creatinine returned to near baseline at 0.9 and patient was initiated on  Entresto. Cardiac catheterization was discussed with family and they agreed to transfer to [facility] for LHC.    On brief exam in [facility] lab, patient reporting chest pain whenever she rolls over in bed.  Past Medical History:  Diagnosis Date   Combined hyperlipidemia    COPD (chronic obstructive pulmonary disease)  (HCC)    Dysgeusia    GERD (gastroesophageal reflux disease)    Hypercalcemia    Hyperparathyroidism , secondary, non-renal (HCC)    Hypertension    Hypothyroidism, acquired, autoimmune    Iron deficiency    Lymphoma, mantle cell, multiple sites ([place])    Nontoxic multinodular goiter    Obesity (BMI 30.0-34.9)    Other osteoporosis    Pallor    Thyroiditis, autoimmune    Toxic nodular goiter    Vitamin D deficiency disease     Past Surgical History:  Procedure Laterality Date   HAND SURGERY       Medications Prior to Admission: Prior to Admission medications   Medication Sig Start Date End Date Taking? Authorizing Provider  aspirin 81 MG chewable tablet Chew by mouth daily.    [provider]  ezetimibe (ZETIA) 10 MG tablet Take 10 mg by mouth daily.    [provider]  fesoterodine (TOVIAZ) 4 MG TB24 tablet Take 1 tablet (4 mg total) by mouth daily. 08/17/22   Garwin Brothers, MD  loratadine (CLARITIN) 10 MG tablet Take 10 mg by mouth daily.    [provider]  metoprolol tartrate (LOPRESSOR) 50 MG tablet Take 50 mg by mouth daily. 10/14/17   [provider]  omeprazole (PRILOSEC) 40 MG capsule  05/07/20   [provider]  pravastatin (PRAVACHOL) 40 MG tablet  05/06/20   [provider]     Allergies:    Allergies  Allergen Reactions   Codeine    Latex     Social History:   Social History   Socioeconomic History   Marital status: Married    Spouse name: Not on file   Number of children: Not on file   Years of education: Not on file   Highest education level: Not on file  Occupational History   Not on file  Tobacco Use   Smoking status: Never    Passive exposure: Yes   Smokeless tobacco: Never  Substance and Sexual Activity   Alcohol use: No   Drug use: No   Sexual activity: Not on file  Other Topics Concern   Not on file  Social History Narrative   Not on file   Social Determinants of Health   Financial  Resource Strain: Not on file  Food Insecurity: No Food Insecurity (06/18/2021)   Hunger Vital Sign    Worried About Running Out of Food in the Last Year: Never true    Ran Out of Food in the Last Year: Never true  Transportation Needs: No Transportation Needs (06/18/2021)   PRAPARE - Hydrologist (Medical): No    Lack of Transportation (Non-Medical): No  Physical Activity: Not on file  Stress: Not on file  Social Connections: Not on file  Intimate Partner Violence: Not on file    Family History:   The patient's family history includes Cancer in her sister; Diabetes in her father; Heart disease in her sister.    ROS:  Please see the history of present illness.  All other ROS reviewed and negative.     Physical Exam/Data:   Vitals:  08/25/22 1346  SpO2: 97%   No intake or output data in the 24 hours ending 08/25/22 1421    02/21/2022   10:30 AM 07/29/2021    2:31 PM 07/07/2021   10:29 AM  Last 3 Weights  Weight (lbs) 136 lb 8 oz 133 lb 6.4 oz 132 lb 4.8 oz  Weight (kg) 61.916 kg 60.51 kg 60.011 kg     There is no height or weight on file to calculate BMI.   Physical Exam Vitals and nursing note reviewed.  Constitutional:      Appearance: Normal appearance.  HENT:     Head: Normocephalic and atraumatic.     Nose: Nose normal.  Eyes:     Pupils: Pupils are equal, round, and reactive to light.  Cardiovascular:     Rate and Rhythm: Normal rate and regular rhythm.     Pulses: Normal pulses.  Pulmonary:     Effort: Pulmonary effort is normal.     Comments: Slightly diminished in bilateral bases Abdominal:     General: Abdomen is flat.     Palpations: Abdomen is soft.  Musculoskeletal:        General: Normal range of motion.     Comments: Right radial artery with TR band in place.  Skin:    General: Skin is warm and dry.     Capillary Refill: Capillary refill takes less than 2 seconds.  Neurological:     Mental Status: She is alert.  Mental status is at baseline.  Psychiatric:        Mood and Affect: Mood normal.        Behavior: Behavior normal.        Thought Content: Thought content normal.        Judgment: Judgment normal.       EKG:  n/a  Relevant CV Studies:  08/22/22 TTE from [facility]  Patient has normal left ventricular chamber size. Ejection fraction 40-45%. Regional wall motion abnormalities noted. Right ventricle mildly enlarged and right ventricular global systolic function moderately reduced. Mild-to-moderate tricuspid valve regurgitation. Pressures 38 mmHg.        Laboratory Data:  High Sensitivity Troponin:  No results for input(s): "TROPONINIHS" in the last 720 hours.    ChemistryNo results for input(s): "NA", "K", "CL", "CO2", "GLUCOSE", "BUN", "CREATININE", "CALCIUM", "MG", "GFRNONAA", "GFRAA", "ANIONGAP" in the last 168 hours.  No results for input(s): "PROT", "ALBUMIN", "AST", "ALT", "ALKPHOS", "BILITOT" in the last 168 hours. Lipids No results for input(s): "CHOL", "TRIG", "HDL", "LABVLDL", "LDLCALC", "CHOLHDL" in the last 168 hours. HematologyNo results for input(s): "WBC", "RBC", "HGB", "HCT", "MCV", "MCH", "MCHC", "RDW", "PLT" in the last 168 hours. Thyroid No results for input(s): "TSH", "FREET4" in the last 168 hours. BNPNo results for input(s): "BNP", "PROBNP" in the last 168 hours.  DDimer No results for input(s): "DDIMER" in the last 168 hours.   Radiology/Studies:  No results found.               Assessment and Plan:   Joy Daniels is a 81 y.o. female with hx of hyperthyroidism, hypertension, COPD, DM, anemia, mantle cell lymphoma (2011 dx), colon cancer (s/p resection and primary anastomosis) who is being seen 08/25/2022 for LHC due to finding of new systolic CHF.  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  Patient transferred to [facility] from [facility] for [facility] after admission 2 days ago with elevated pro-BNP (24,200), dehydration, and newly noted  decreased LVEF ~45% with global hypokinesis. Medical records from [place] paint  a mixed picture with some HF symptoms as well as dehydration with known recent poor oral intake. Troponin there 0.67 -> 0.62 -> 0.40. CT chest/abd/pelvis notable for new pleural base nodule in LLL measuring 1.8 by 1.6cm with adjacent 0.8 by 0.7 cm subpleural nodularity. Metastatic disease not excluded. New peripheral airspace opacity also noted in posterior upper right lobe inflammatory vs neoplastic.  Coronary atherosclerotic disease also reported.  LHC with Dr. Irish Lack today Continue Coreg 6.'25mg'$  BID High intensity statin Add/titrate additional GDMT as able. Will plan to reassess LV function on LHC as well.   AKI (resolved)  Patient with creatinine elevated to 2.0 at outside hospital. Back to near baseline, 0.9 on 12/13. Closely monitor following LHC today.   DM  Patient does not appear to have a formal diagnosis. A1c 5.5 at [place].  Abnormal UA  UA obtained at [facility] on 12/10 with few bacteria, 2+ blood, 2 WBC. No urinary symptoms reported.  Risk Assessment/Risk Scores:    TIMI Risk Score for Unstable Angina or Non-ST Elevation MI:   The patient's TIMI risk score is 3, which indicates a 13% risk of all cause mortality, new or recurrent myocardial infarction or need for urgent revascularization in the next 14 days.       Severity of Illness: The appropriate patient status for this patient is OBSERVATION. Observation status is judged to be reasonable and necessary in order to provide the required intensity of service to ensure the patient's safety. The patient's presenting symptoms, physical exam findings, and initial radiographic and laboratory data in the context of their medical condition is felt to place them at decreased risk for further clinical deterioration. Furthermore, it is anticipated that the patient will be medically stable for discharge from the hospital within 2 midnights of admission.     For questions or updates, please contact [place] Please consult [email or web address] for contact info under     Signed, Lily Kocher, PA-C  08/25/2022 2:21 PM   I have examined the patient and reviewed assessment and plan and discussed with patient.  Agree with above as stated.    Patient arrived straight to the cath lab from [place].  EF 40-45%.  CT concerning for malignancy.  Trop 0.67,  BNP 24K from [place].  Cr 0.9.     GEN: frail, in no acute distress  HEENT: normal  Neck: no JVD, carotid bruits, or masses Cardiac: RRR; no edema  Respiratory:  clear to auscultation bilaterally, normal work of breathing GI: soft, nontender, nondistended,  MS: no deformity or atrophy  Skin: warm and dry, no rash Neuro:  Strength and sensation are intact Psych: euthymic mood, full affect   Discussed with patient's family post cath.  She has had longstanding issues with falling.  SHe lives with family.   Larae Grooms

## 2022-08-25 NOTE — Progress Notes (Signed)
Report called for Karie Chimera RN to [place] RN, pt taken to floor by NT [facility]. Family followed.

## 2022-08-25 NOTE — Progress Notes (Signed)
    Patient seen after catheterization. Continues to appear weaker than usual per family at bedside, consistent with the last few days. She reportedly required complete ambulation assistance to transfer to and from the toilet the day of admission to [place]. Although patient lives with her daughter, there are times when assistance is not available and I have high concerns about recurrent falls (found on the floor over the weekend). We will admit overnight and arrange PT/OT evaluation. Patient may benefit from home health and/or rehab placement. Hopeful that patient can discharge tomorrow. Reviewed code status with patient and family. Patient with some confusion, will make patient full code per POA.

## 2022-08-25 NOTE — Discharge Summary (Incomplete)
Discharge Summary for Same Day PCI   Patient ID: Joy Daniels MRN: [id number]; DOB: [birth year]  Admit date: 08/25/2022 Discharge date: 08/25/2022  Primary Care Provider: Garwin Brothers, MD  Primary Cardiologist: None  Primary Electrophysiologist:  None   Discharge Diagnoses    Principal Problem:   Abnormal echocardiogram    Diagnostic Studies/Procedures    Cardiac Catheterization 08/25/2022:  *** _____________   History of Present Illness     Joy Daniels is a 81 y.o. female with ***.   Cardiac catheterization was arranged for further evaluation.  Hospital Course     The patient underwent cardiac cath as noted above with ***. Plan for DAPT with ASA/*** for at least ***. The patient was seen by cardiac rehab while in short stay. There were no observed complications post cath. Radial*** cath site was re-evaluated prior to discharge and found to be stable without any complications. Instructions/precautions regarding cath site care were given prior to discharge.  Joy Daniels was seen by Dr. Marland Kitchen and determined stable for discharge home. Follow up with our office has been arranged. Medications are listed below. Pertinent changes include ***.    _____________  Cath/PCI Registry Performance & Quality Measures: Aspirin prescribed? - {Yes or If no, why not?:20788} ADP Receptor Inhibitor (Plavix/Clopidogrel, Brilinta/Ticagrelor or Effient/Prasugrel) prescribed (includes medically managed patients)? - {Yes or If no, why not?:20788} High Intensity Statin (Lipitor 40-'80mg'$  or Crestor 20-'40mg'$ ) prescribed? - {Yes or If no, why not?:20788} For EF <40%, was ACEI/ARB prescribed? - {Yes, No, Not Applicable:210360501} For EF <40%, Aldosterone Antagonist (Spironolactone or Eplerenone) prescribed? - {Yes, No, Not Applicable:210360501} Cardiac Rehab Phase II ordered (Included Medically managed Patients)? - {Yes or If no, why not?:20788}  _____________   Discharge Vitals Height '5\' 1"'$   (1.549 m), weight 61.9 kg, SpO2 97 %.  Filed Weights   08/25/22 1500  Weight: 61.9 kg    Last Labs & Radiologic Studies    CBC No results for input(s): "WBC", "NEUTROABS", "HGB", "HCT", "MCV", "PLT" in the last 72 hours. Basic Metabolic Panel No results for input(s): "NA", "K", "CL", "CO2", "GLUCOSE", "BUN", "CREATININE", "CALCIUM", "MG", "PHOS" in the last 72 hours. Liver Function Tests No results for input(s): "AST", "ALT", "ALKPHOS", "BILITOT", "PROT", "ALBUMIN" in the last 72 hours. No results for input(s): "LIPASE", "AMYLASE" in the last 72 hours. High Sensitivity Troponin:   No results for input(s): "TROPONINIHS" in the last 720 hours.  BNP Invalid input(s): "POCBNP" D-Dimer No results for input(s): "DDIMER" in the last 72 hours. Hemoglobin A1C No results for input(s): "HGBA1C" in the last 72 hours. Fasting Lipid Panel No results for input(s): "CHOL", "HDL", "LDLCALC", "TRIG", "CHOLHDL", "LDLDIRECT" in the last 72 hours. Thyroid Function Tests No results for input(s): "TSH", "T4TOTAL", "T3FREE", "THYROIDAB" in the last 72 hours.  Invalid input(s): "FREET3" _____________  CARDIAC CATHETERIZATION  Result Date: 08/25/2022   1st Diag lesion is 50% stenosed.   Mid RCA lesion is 70% stenosed.   The left ventricular systolic function is normal.   LV end diastolic pressure is normal.   The left ventricular ejection fraction is 50-55% by visual estimate.   There is no aortic valve stenosis. Small vessel disease.  No PCI targets.  Medical therapy.    Disposition   Pt is being discharged home today in good condition.  Follow-up Plans & Appointments        Discharge Medications   Allergies as of 08/25/2022       Reactions   Codeine  Latex      Med Rec must be completed prior to using this [place]***          Allergies Allergies  Allergen Reactions   Codeine    Latex     Outstanding Labs/Studies   ***  Duration of Discharge Encounter   Greater  than 30 minutes including physician time.  Delton Coombes, PA-C 08/25/2022, 3:40 PM

## 2022-08-25 NOTE — Progress Notes (Signed)
    Patient arrived straight to the cath lab from [place].  EF 40-45%.  CT concerning for malignancy.  Trop 0.67,  BNP 24K from [place].  Cr 0.9.    GEN: frail, in no acute distress  HEENT: normal  Neck: no JVD, carotid bruits, or masses Cardiac: RRR; no edema  Respiratory:  clear to auscultation bilaterally, normal work of breathing GI: soft, nontender, nondistended,  MS: no deformity or atrophy  Skin: warm and dry, no rash Neuro:  Strength and sensation are intact Psych: euthymic mood, full affect   Cath Lab Visit (complete for each Cath Lab visit)  Clinical Evaluation Leading to the Procedure:   ACS: Yes.    Non-ACS:    Anginal Classification: CCS IV  Anti-ischemic medical therapy: Minimal Therapy (1 class of medications)  Non-Invasive Test Results: High-risk stress test findings: cardiac mortality >3%/year  low EF  Prior CABG: No previous CABG   Jettie Booze, MD       Jettie Booze, MD

## 2022-08-26 ENCOUNTER — Encounter (HOSPITAL_COMMUNITY): Payer: Self-pay | Admitting: Interventional Cardiology

## 2022-08-26 DIAGNOSIS — I110 Hypertensive heart disease with heart failure: Secondary | ICD-10-CM | POA: Diagnosis not present

## 2022-08-26 DIAGNOSIS — Z79899 Other long term (current) drug therapy: Secondary | ICD-10-CM | POA: Diagnosis not present

## 2022-08-26 DIAGNOSIS — Z8249 Family history of ischemic heart disease and other diseases of the circulatory system: Secondary | ICD-10-CM | POA: Diagnosis not present

## 2022-08-26 DIAGNOSIS — I214 Non-ST elevation (NSTEMI) myocardial infarction: Secondary | ICD-10-CM | POA: Diagnosis not present

## 2022-08-26 DIAGNOSIS — Z7982 Long term (current) use of aspirin: Secondary | ICD-10-CM | POA: Diagnosis not present

## 2022-08-26 DIAGNOSIS — M810 Age-related osteoporosis without current pathological fracture: Secondary | ICD-10-CM | POA: Diagnosis present

## 2022-08-26 DIAGNOSIS — I5021 Acute systolic (congestive) heart failure: Secondary | ICD-10-CM | POA: Diagnosis not present

## 2022-08-26 DIAGNOSIS — E119 Type 2 diabetes mellitus without complications: Secondary | ICD-10-CM | POA: Diagnosis not present

## 2022-08-26 DIAGNOSIS — I428 Other cardiomyopathies: Secondary | ICD-10-CM | POA: Diagnosis not present

## 2022-08-26 DIAGNOSIS — E875 Hyperkalemia: Secondary | ICD-10-CM | POA: Diagnosis present

## 2022-08-26 DIAGNOSIS — R54 Age-related physical debility: Secondary | ICD-10-CM | POA: Diagnosis present

## 2022-08-26 DIAGNOSIS — J449 Chronic obstructive pulmonary disease, unspecified: Secondary | ICD-10-CM | POA: Diagnosis not present

## 2022-08-26 DIAGNOSIS — I5043 Acute on chronic combined systolic (congestive) and diastolic (congestive) heart failure: Secondary | ICD-10-CM | POA: Diagnosis not present

## 2022-08-26 DIAGNOSIS — Z85038 Personal history of other malignant neoplasm of large intestine: Secondary | ICD-10-CM | POA: Diagnosis not present

## 2022-08-26 DIAGNOSIS — E063 Autoimmune thyroiditis: Secondary | ICD-10-CM | POA: Diagnosis not present

## 2022-08-26 DIAGNOSIS — R296 Repeated falls: Secondary | ICD-10-CM | POA: Diagnosis present

## 2022-08-26 DIAGNOSIS — R5383 Other fatigue: Secondary | ICD-10-CM | POA: Diagnosis not present

## 2022-08-26 DIAGNOSIS — R262 Difficulty in walking, not elsewhere classified: Secondary | ICD-10-CM | POA: Diagnosis present

## 2022-08-26 DIAGNOSIS — I5041 Acute combined systolic (congestive) and diastolic (congestive) heart failure: Secondary | ICD-10-CM | POA: Diagnosis not present

## 2022-08-26 DIAGNOSIS — E7849 Other hyperlipidemia: Secondary | ICD-10-CM | POA: Diagnosis not present

## 2022-08-26 DIAGNOSIS — R7989 Other specified abnormal findings of blood chemistry: Secondary | ICD-10-CM | POA: Diagnosis not present

## 2022-08-26 DIAGNOSIS — R0902 Hypoxemia: Secondary | ICD-10-CM | POA: Diagnosis present

## 2022-08-26 DIAGNOSIS — E860 Dehydration: Secondary | ICD-10-CM | POA: Diagnosis not present

## 2022-08-26 DIAGNOSIS — E876 Hypokalemia: Secondary | ICD-10-CM | POA: Diagnosis present

## 2022-08-26 DIAGNOSIS — I2510 Atherosclerotic heart disease of native coronary artery without angina pectoris: Secondary | ICD-10-CM | POA: Diagnosis not present

## 2022-08-26 DIAGNOSIS — N179 Acute kidney failure, unspecified: Secondary | ICD-10-CM | POA: Diagnosis not present

## 2022-08-26 DIAGNOSIS — R931 Abnormal findings on diagnostic imaging of heart and coronary circulation: Secondary | ICD-10-CM | POA: Diagnosis present

## 2022-08-26 DIAGNOSIS — E782 Mixed hyperlipidemia: Secondary | ICD-10-CM | POA: Diagnosis not present

## 2022-08-26 DIAGNOSIS — Z833 Family history of diabetes mellitus: Secondary | ICD-10-CM | POA: Diagnosis not present

## 2022-08-26 DIAGNOSIS — C8310 Mantle cell lymphoma, unspecified site: Secondary | ICD-10-CM | POA: Diagnosis not present

## 2022-08-26 LAB — BASIC METABOLIC PANEL
Anion gap: 14 (ref 5–15)
BUN: 14 mg/dL (ref 8–23)
CO2: 18 mmol/L — ABNORMAL LOW (ref 22–32)
Calcium: 9.3 mg/dL (ref 8.9–10.3)
Chloride: 104 mmol/L (ref 98–111)
Creatinine, Ser: 1.19 mg/dL — ABNORMAL HIGH (ref 0.44–1.00)
GFR, Estimated: 46 mL/min — ABNORMAL LOW (ref >60)
Glucose, Bld: 133 mg/dL — ABNORMAL HIGH (ref 70–99)
Potassium: 2.7 mmol/L — CL (ref 3.5–5.1)
Sodium: 136 mmol/L (ref 135–145)

## 2022-08-26 LAB — CBC
HCT: 39.7 % (ref 36.0–46.0)
Hemoglobin: 13.3 g/dL (ref 12.0–15.0)
MCH: 29.1 pg (ref 26.0–34.0)
MCHC: 33.5 g/dL (ref 30.0–36.0)
MCV: 86.9 fL (ref 80.0–100.0)
Platelets: 180 10*3/uL (ref 150–400)
RBC: 4.57 MIL/uL (ref 3.87–5.11)
RDW: 13.1 % (ref 11.5–15.5)
WBC: 5.5 10*3/uL (ref 4.0–10.5)
nRBC: 0 % (ref 0.0–0.2)

## 2022-08-26 LAB — MAGNESIUM: Magnesium: 1.3 mg/dL — ABNORMAL LOW (ref 1.7–2.4)

## 2022-08-26 LAB — BRAIN NATRIURETIC PEPTIDE: B Natriuretic Peptide: 1066 pg/mL — ABNORMAL HIGH (ref 0.0–100.0)

## 2022-08-26 MED ORDER — MAGNESIUM SULFATE 4 GM/100ML IV SOLN
4.0000 g | Freq: Once | INTRAVENOUS | Status: AC
  Administered 2022-08-26: 4 g via INTRAVENOUS
  Filled 2022-08-26: qty 100

## 2022-08-26 MED ORDER — POTASSIUM CHLORIDE CRYS ER 20 MEQ PO TBCR
40.0000 meq | EXTENDED_RELEASE_TABLET | Freq: Once | ORAL | Status: AC
  Administered 2022-08-26: 40 meq via ORAL
  Filled 2022-08-26: qty 2

## 2022-08-26 MED ORDER — POTASSIUM CHLORIDE 10 MEQ/100ML IV SOLN
10.0000 meq | INTRAVENOUS | Status: AC
  Administered 2022-08-26 (×4): 10 meq via INTRAVENOUS
  Filled 2022-08-26 (×4): qty 100

## 2022-08-26 MED ORDER — FUROSEMIDE 10 MG/ML IJ SOLN
40.0000 mg | Freq: Once | INTRAMUSCULAR | Status: DC
  Filled 2022-08-26: qty 4

## 2022-08-26 MED ORDER — SPIRONOLACTONE 12.5 MG HALF TABLET
12.5000 mg | ORAL_TABLET | Freq: Every day | ORAL | Status: DC
  Administered 2022-08-26 – 2022-08-29 (×4): 12.5 mg via ORAL
  Filled 2022-08-26 (×4): qty 1

## 2022-08-26 MED ORDER — POTASSIUM CHLORIDE CRYS ER 20 MEQ PO TBCR
40.0000 meq | EXTENDED_RELEASE_TABLET | ORAL | Status: AC
  Administered 2022-08-26 (×2): 40 meq via ORAL
  Filled 2022-08-26 (×2): qty 2

## 2022-08-26 NOTE — Evaluation (Signed)
Occupational Therapy Evaluation Patient Details Name: Joy Daniels MRN: [id number] DOB: 1941-04-28 Today's Date: 08/26/2022   History of Present Illness Patient is a 81 year old female with  hx of hyperthyroidism, hypertension, COPD, DM, anemia, mantle cell lymphoma (2011 dx), colon cancer (s/p resection and primary anastomosis), seen 08/25/2022 for LHC due to finding of new systolic CHF.   Clinical Impression   Pt walks with a cane and endorses many falls. She can bathe and dress herself, but is assisted for getting in and out of the tub. Pt's daughter manages medications and all IADLs. Pt presents with impaired cognition and generalized weakness. Pt with soiled bed, assisted with pericare, changing linen and LB dressing rolling in bed with max assist. Supportive daughter at bedside. Pt will need SNF for further rehab prior to return home. Will follow acutely.      Recommendations for follow up therapy are one component of a multi-disciplinary discharge planning process, led by the attending physician.  Recommendations may be updated based on patient status, additional functional criteria and insurance authorization.   Follow Up Recommendations  Skilled nursing-short term rehab (<3 hours/day)     Assistance Recommended at Discharge Frequent or constant Supervision/Assistance  Patient can return home with the following Two people to help with walking and/or transfers;A lot of help with bathing/dressing/bathroom;Assistance with cooking/housework;Assistance with feeding;Direct supervision/assist for medications management;Direct supervision/assist for financial management;Assist for transportation;Help with stairs or ramp for entrance    Functional Status Assessment  Patient has had a recent decline in their functional status and demonstrates the ability to make significant improvements in function in a reasonable and predictable amount of time.  Equipment Recommendations  None recommended  by OT (defer to next venue)    Recommendations for Other Services       Precautions / Restrictions Precautions Precautions: Fall Restrictions Weight Bearing Restrictions: No      Mobility Bed Mobility Overal bed mobility: Needs Assistance Bed Mobility: Rolling Rolling: Max assist         General bed mobility comments: rolled for pericare and linen change    Transfers                   General transfer comment: deferred      Balance                                           ADL either performed or assessed with clinical judgement   ADL Overall ADL's : Needs assistance/impaired Eating/Feeding: Set up;Bed level   Grooming: Bed level;Brushing hair;Total assistance   Upper Body Bathing: Total assistance;Bed level   Lower Body Bathing: Total assistance;Bed level   Upper Body Dressing : Moderate assistance;Bed level   Lower Body Dressing: Total assistance;Bed level       Toileting- Clothing Manipulation and Hygiene: Total assistance;Bed level               Vision Ability to See in Adequate Light: 0 Adequate Patient Visual Report: No change from baseline       Perception     Praxis      Pertinent Vitals/Pain Pain Assessment Pain Assessment: No/denies pain     Hand Dominance Right   Extremity/Trunk Assessment Upper Extremity Assessment Upper Extremity Assessment: Generalized weakness   Lower Extremity Assessment Lower Extremity Assessment: Defer to PT evaluation       Communication Communication Communication:  No difficulties   Cognition Arousal/Alertness: Awake/alert Behavior During Therapy: WFL for tasks assessed/performed Overall Cognitive Status: Impaired/Different from baseline Area of Impairment: Following commands, Problem solving, Memory                     Memory: Decreased short-term memory Following Commands: Follows one step commands with increased time     Problem Solving: Slow  processing, Decreased initiation, Requires verbal cues, Requires tactile cues       General Comments       Exercises     Shoulder Instructions      Home Living Family/patient expects to be discharged to:: Private residence Living Arrangements: Children;Other relatives (daughter and 4 grandchildren) Available Help at Discharge: Family Type of Home: House Home Access: Stairs to enter Technical brewer of Steps: 5 Entrance Stairs-Rails: Right;Left Home Layout: One level     Bathroom Shower/Tub: Tub/shower unit         Home Equipment: Cane - quad;Cane - single Optometrist (2 wheels)          Prior Functioning/Environment Prior Level of Function : Independent/Modified Independent;History of Falls (last six months)             Mobility Comments: using single point or 4 point cane, many falls ADLs Comments: daughter assists with getting in and out of the tub and with all IADLs        OT Problem List: Decreased strength;Decreased activity tolerance;Impaired balance (sitting and/or standing);Decreased cognition;Decreased knowledge of use of DME or AE;Obesity      OT Treatment/Interventions: Self-care/ADL training;DME and/or AE instruction;Therapeutic activities;Patient/family education;Balance training    OT Goals(Current goals can be found in the care plan section) Acute Rehab OT Goals OT Goal Formulation: With patient Time For Goal Achievement: 09/09/22 Potential to Achieve Goals: Fair ADL Goals Pt Will Perform Grooming: with min assist;standing Pt Will Perform Upper Body Bathing: with min assist;sitting Pt Will Perform Upper Body Dressing: with supervision;sitting Pt Will Transfer to Toilet: with min assist;ambulating;bedside commode Additional ADL Goal #1: Pt will perform bed mobility with min assist in preparation for ADLs.  OT Frequency: Min 2X/week    Co-evaluation              AM-PAC OT "6 Clicks" Daily Activity      Outcome Measure Help from another person eating meals?: A Little Help from another person taking care of personal grooming?: Total Help from another person toileting, which includes using toliet, bedpan, or urinal?: Total Help from another person bathing (including washing, rinsing, drying)?: A Lot Help from another person to put on and taking off regular upper body clothing?: A Lot Help from another person to put on and taking off regular lower body clothing?: Total 6 Click Score: 10   End of Session    Activity Tolerance: Patient tolerated treatment well Patient left: in bed;with call bell/phone within reach;with bed alarm set;with family/visitor present  OT Visit Diagnosis: Muscle weakness (generalized) (M62.81)                Time: 5916-3846 OT Time Calculation (min): 27 min Charges:  OT General Charges $OT Visit: 1 Visit OT Evaluation $OT Eval Moderate Complexity: 1 Mod, 1 self care  Cleta Alberts, OTR/L Acute Rehabilitation Services Office: [phone]   Malka So 08/26/2022, 4:03 PM

## 2022-08-26 NOTE — Evaluation (Signed)
Physical Therapy Evaluation Patient Details Name: Joy Daniels MRN: [id number] DOB: 16-Aug-1941 Today's Date: 08/26/2022  History of Present Illness  Patient is a 81 year old female with  hx of hyperthyroidism, hypertension, COPD, DM, anemia, mantle cell lymphoma (2011 dx), colon cancer (s/p resection and primary anastomosis), seen 08/25/2022 for LHC due to finding of new systolic CHF.  Clinical Impression  Patient is agreeable to PT evaluation. She reports she is ambulatory using a cane at baseline and has had more than one fall recently. She reports her daughter and multiple grandchildren live with her.  Today, the patient required moderate assistance for bed mobility and transfers. She could only ambulate a few feet with Mod A- Max A support with rolling walker with heavy reliance on the bed for posterior leg support to maintain standing balance. She appears to be generally weak and is fatigued with minimal activity. Recommend to continue PT to maximize independence and facilitate return to prior level of function. SNF is recommended for short term rehab at this time.      Recommendations for follow up therapy are one component of a multi-disciplinary discharge planning process, led by the attending physician.  Recommendations may be updated based on patient status, additional functional criteria and insurance authorization.  Follow Up Recommendations Skilled nursing-short term rehab (<3 hours/day) Can patient physically be transported by private vehicle: No    Assistance Recommended at Discharge Frequent or constant Supervision/Assistance  Patient can return home with the following  A lot of help with walking and/or transfers;A lot of help with bathing/dressing/bathroom;Assist for transportation;Help with stairs or ramp for entrance    Equipment Recommendations None recommended by PT  Recommendations for Other Services       Functional Status Assessment Patient has had a recent decline  in their functional status and demonstrates the ability to make significant improvements in function in a reasonable and predictable amount of time.     Precautions / Restrictions Precautions Precautions: Fall Restrictions Weight Bearing Restrictions: No      Mobility  Bed Mobility Overal bed mobility: Needs Assistance Bed Mobility: Supine to Sit, Sit to Supine     Supine to sit: Mod assist Sit to supine: Mod assist   General bed mobility comments: cues for task initiation. assistance required for LE and trunk support    Transfers Overall transfer level: Needs assistance Equipment used: Rolling walker (2 wheels) Transfers: Sit to/from Stand Sit to Stand: Mod assist           General transfer comment: lifting and lowering assistance provided. cues for anterior weight shifting    Ambulation/Gait Ambulation/Gait assistance: Mod assist Gait Distance (Feet): 2 Feet Assistive device: Rolling walker (2 wheels)   Gait velocity: decreased     General Gait Details: patient able to take 4 smalls steps along the edge of bed with rolling walker. patient has posterior lean and is relying on the bed for posterior leg support to maintain standing balance. fatigued with minimal activity and unable to ambulate further at this time  Stairs            Wheelchair Mobility    Modified Rankin (Stroke Patients Only)       Balance Overall balance assessment: Needs assistance, History of Falls Sitting-balance support: Feet supported Sitting balance-Leahy Scale: Fair   Postural control: Posterior lean Standing balance support: During functional activity, Reliant on assistive device for balance Standing balance-Leahy Scale: Zero Standing balance comment: Mod A- Max A required initially to maintain standing  balance                             Pertinent Vitals/Pain Pain Assessment Pain Assessment: Faces Faces Pain Scale: Hurts a little bit Pain Location: L  forearm Pain Descriptors / Indicators: Discomfort Pain Intervention(s): Monitored during session, Limited activity within patient's tolerance    Home Living Family/patient expects to be discharged to:: Private residence Living Arrangements: Children;Other relatives (daughter, 4 grandchildren) Available Help at Discharge: Family Type of Home: Mobile home Home Access: Stairs to enter Entrance Stairs-Rails: Psychiatric nurse of Steps: 5   Home Layout: One level Home Equipment: Cane - quad;Cane - single Optometrist (2 wheels)      Prior Function Prior Level of Function : Independent/Modified Independent;History of Falls (last six months)             Mobility Comments: using single point or 4 point cane ADLs Comments: daughter assists     Hand Dominance   Dominant Hand: Right    Extremity/Trunk Assessment   Upper Extremity Assessment Upper Extremity Assessment: Generalized weakness    Lower Extremity Assessment Lower Extremity Assessment: Generalized weakness       Communication   Communication: No difficulties  Cognition Arousal/Alertness: Awake/alert Behavior During Therapy: WFL for tasks assessed/performed Overall Cognitive Status: No family/caregiver present to determine baseline cognitive functioning Area of Impairment: Following commands, Problem solving                       Following Commands: Follows one step commands with increased time     Problem Solving: Slow processing, Decreased initiation, Requires verbal cues, Requires tactile cues          General Comments General comments (skin integrity, edema, etc.): Sp02 decreased to 89% on room air without supplemental 02. PT order placed to start at 16:02 today- MD cleared patient for participation with PT now (via secure chat)    Exercises     Assessment/Plan    PT Assessment Patient needs continued PT services  PT Problem List Decreased  strength;Decreased range of motion;Decreased activity tolerance;Decreased mobility;Decreased balance       PT Treatment Interventions DME instruction;Gait training;Functional mobility training;Stair training;Therapeutic exercise;Therapeutic activities;Balance training;Neuromuscular re-education;Patient/family education;Cognitive remediation    PT Goals (Current goals can be found in the Care Plan section)  Acute Rehab PT Goals Patient Stated Goal: to feel better PT Goal Formulation: With patient Time For Goal Achievement: 09/09/22 Potential to Achieve Goals: Good    Frequency Min 2X/week     Co-evaluation               AM-PAC PT "6 Clicks" Mobility  Outcome Measure Help needed turning from your back to your side while in a flat bed without using bedrails?: A Lot Help needed moving from lying on your back to sitting on the side of a flat bed without using bedrails?: A Lot Help needed moving to and from a bed to a chair (including a wheelchair)?: A Lot Help needed standing up from a chair using your arms (e.g., wheelchair or bedside chair)?: A Lot Help needed to walk in hospital room?: A Lot Help needed climbing 3-5 steps with a railing? : Total 6 Click Score: 11    End of Session Equipment Utilized During Treatment: Oxygen Activity Tolerance: Patient limited by fatigue Patient left: in bed;with call bell/phone within reach;with bed alarm set Nurse Communication: Mobility status PT Visit Diagnosis: Unsteadiness  on feet (R26.81);Muscle weakness (generalized) (M62.81)    Time: 0076-2263 PT Time Calculation (min) (ACUTE ONLY): 18 min   Charges:   PT Evaluation $PT Eval Low Complexity: 1 Low PT Treatments $Therapeutic Activity: 8-22 mins       Minna Merritts, PT, MPT  Percell Locus 08/26/2022, 12:18 PM

## 2022-08-26 NOTE — Care Management Obs Status (Signed)
[place] NOTIFICATION   Patient Details  Name: NESTA SCATURRO MRN: [id number] Date of Birth: [birth year]   Medicare Observation Status Notification Given:  Yes    Bartholomew Crews, RN 08/26/2022, 1:46 PM

## 2022-08-26 NOTE — Progress Notes (Addendum)
Rounding Note    Patient Name: Joy Daniels Date of Encounter: 08/26/2022  [place] Cardiologist: None   Subjective   No acute overnight events. She denise any recurrent abdominal pain or chest pain. She does report feeling short of breath today and is on 2 L of O2 via nasal cannula. She is not on any O2 at home. She does have a history of COPD.  Inpatient Medications    Scheduled Meds:  aspirin EC  81 mg Oral Daily   atorvastatin  40 mg Oral Daily   carvedilol  6.25 mg Oral BID WC   enoxaparin (LOVENOX) injection  40 mg Subcutaneous Q24H   ezetimibe  10 mg Oral Daily   fesoterodine  4 mg Oral Daily   furosemide  40 mg Intravenous Once   loratadine  10 mg Oral Daily   pantoprazole  40 mg Oral Daily   sodium chloride flush  3 mL Intravenous Q12H   Continuous Infusions:  sodium chloride     sodium chloride     PRN Meds: sodium chloride, sodium chloride, acetaminophen, nitroGLYCERIN, ondansetron (ZOFRAN) IV, sodium chloride flush   Vital Signs    Vitals:   08/26/22 0348 08/26/22 0521 08/26/22 0625 08/26/22 0745  BP: 119/62   127/74  Pulse: 88 81 80 82  Resp: '20 19 18 '$ (!) 23  Temp: 97.9 F (36.6 C)   97.6 F (36.4 C)  TempSrc: Axillary   Oral  SpO2: 94% 99% 96% 93%  Weight: 65.6 kg     Height:        Intake/Output Summary (Last 24 hours) at 08/26/2022 1002 Last data filed at 08/26/2022 0348 Gross per 24 hour  Intake 166.45 ml  Output --  Net 166.45 ml      08/26/2022    3:48 AM 08/25/2022    5:06 PM 08/25/2022    3:00 PM  Last 3 Weights  Weight (lbs) 144 lb 10 oz 141 lb 8.6 oz 136 lb 7.4 oz  Weight (kg) 65.6 kg 64.2 kg 61.9 kg      Telemetry    Normal sinus rhythm with rates in the 70s to 90s. - Personally Reviewed  ECG    Normal sinus rhythm with incomplete LBBB and non-specific ST/T changes. - Personally Reviewed  Physical Exam   GEN: Elderly Caucasian female in no acute distress.   Neck: No JVD. Cardiac: RRR. No murmurs,  rubs, or gallops. Right radial cath site soft with no signs of hematoma. Respiratory: No increased work of breathing. Decreased breath sounds bilaterally but no significant crackles appreciated. GI: Soft, non-distended, and non-tender. MS: No lower extremity edema. No deformity. Skin: Warm and dry. Neuro:  No focal deficits. Psych: Normal affect.  Labs    High Sensitivity Troponin:  No results for input(s): "TROPONINIHS" in the last 720 hours.   Chemistry Recent Labs  Lab 08/25/22 1739 08/26/22 0217  NA  --  136  K  --  2.7*  CL  --  104  CO2  --  18*  GLUCOSE  --  133*  BUN  --  14  CREATININE 1.11* 1.19*  CALCIUM  --  9.3  MG  --  1.3*  GFRNONAA 50* 46*  ANIONGAP  --  14    Lipids No results for input(s): "CHOL", "TRIG", "HDL", "LABVLDL", "LDLCALC", "CHOLHDL" in the last 168 hours.  Hematology Recent Labs  Lab 08/25/22 1958 08/26/22 0217  WBC 5.5 5.5  RBC 4.27 4.57  HGB 12.4  13.3  HCT 37.1 39.7  MCV 86.9 86.9  MCH 29.0 29.1  MCHC 33.4 33.5  RDW 13.0 13.1  PLT 204 180   Thyroid No results for input(s): "TSH", "FREET4" in the last 168 hours.  BNPNo results for input(s): "BNP", "PROBNP" in the last 168 hours.  DDimer No results for input(s): "DDIMER" in the last 168 hours.   Radiology    CARDIAC CATHETERIZATION  Result Date: 08/25/2022   1st Diag lesion is 50% stenosed.   Mid RCA lesion is 70% stenosed.   The left ventricular systolic function is normal.   LV end diastolic pressure is normal.   The left ventricular ejection fraction is 50-55% by visual estimate.   There is no aortic valve stenosis. Small vessel disease.  No PCI targets.  Medical therapy.    Cardiac Studies   Left Cardiac Catheterization 08/25/2022:   1st Diag lesion is 50% stenosed.   Mid RCA lesion is 70% stenosed.   The left ventricular systolic function is normal.   LV end diastolic pressure is normal.   The left ventricular ejection fraction is 50-55% by visual estimate.   There is no  aortic valve stenosis.   Small vessel disease.  No PCI targets.  Medical therapy.   Diagnostic Dominance: Left      Patient Profile     81 y.o. female with a history of hypertensin, type 2 diabetes mellitus, hyperthyroidism, COPD, anemia, mantle cell lymphoma, colon cancer s/p resection and primary anastomosis who presented to [facility] on 08/21/2022 with symptoms of constipation for 3 days and new onset abdominal/ chest pain as well as shortness of breath and weakness. Work-up revealed and elevated Troponin I of 0.67 >> 0.40 and pro BNP of 24,200. Echo showed LVEF of 40-45%. Cardiology was consulted and recommended transfer to [facility] for cardiac catheterization.  Assessment & Plan    Abdominal/ Chest Pain Elevated Troponin Patient presented to [facility] with abdominal/ chest pain in setting of constipation x3 days. Troponin I was elevated at 0.67 >> 0.40. Chest/abdominal/ pelvic CT a new pleural base nodule but no acute findings to explain symptoms. Echo reportedly showed LVEF of 40-45%. Marland Kitchen She was transferred to [facility] for cardiac catheterization. LHC on 08/25/2022 showed small vessel disease with 50% stenosis of 1st Diag and 70% stenosis of mid RCA. Not felt to have any PCI targets and medical therapy was recommended.  - No recurrent abdominal pain or chest pain.  - Continue aspirin, beta-blocker, and high-intensity statin.  Acute Systolic CHF Pro-BNP at [facility] was markedly elevated at 24,000. Chest x-ray showed peripheral opacity in the right lower lobe (possibly scarring) but no acute findings. CT showed no overt edema.  Echo showed LVEF of 40-45%, mildly enlarged RV with moderately reduced systolic function, mild to moderate TR, and RVSP of 38 mmHg. There is mention of her getting IV Lasix at [facility] but also mention of her being gentle hydrated with IV fluids due to AKI. - Decreased breaths sounds in bases but appears euvolemic on exam. - I don't  think she needs any IV diuresis at this time. Will repeat BNP.  - She is on Losartan '100mg'$  daily at home but this is currently on hold due to renal function. Renal function has improved some but will continue to hold this and start Spironolactone 12.'5mg'$  daily instead given hypokalemia. If renal function tolerates this okay, can consider restarting Losartan at a lower dose. - Continue Coreg 6.'25mg'$  twice daily. - Continue to  monitor daily weights, strict I/Os, and renal function.  Hypertension BP soft at times but overall stable. - Continue Coreg 6.'25mg'$  twice daily. - Start Spironolactone 12.'5mg'$  daily given reduced EF and hypokalemia. - Continue to hold home Losartan for now. If renal function tolerates addition of Spirolactone, can consider restarting Losartan at a lower dose.  Hyperlipidemia LDL 98 in 2016.  - On Pravastatin at home. This was stopped and she has been started on Lipitor '40mg'$  daily instead. - Continue home Zetia. - Will recheck fasting lipid panel tomorrow morning.  Type 2 Diabetes Mellitus History of type 2 diabetes mellitus listed in chart but not on any medications. Hemoglobin A1c 5.5 at [place].  AKI Creatinine 2.0 on admission. Baseline around 0.8 to 0.9. She was gentle hydrated with IV fluids at [facility]. Likely due to PO intake. - Improved to 1.19 today. - Continue to monitor closely.  Hypokalemia  Potassium 2.7 today.  - Will replete with multiple dose of KCl today and will add Spironolactone 12.'5mg'$  daily. - Will repeat BMET tomorrow.  Hypomagnesemia Magnesium 1.5 today. - Will replete.  COPD No evidence of acute exacerbation at this time. Currently on 2L of O2 via nasal cannula.  Pulmonary Nodule CT at [facility] showed a new pleural base nodule in the left lower lobe. Given history of lymphoma and colon cancer, cannot exclude metastatic disease. - She will need follow-up of this as an outpatient with PCP or Oncologist.  Generalized  Weakness Patient lives at home with daughter and granddaughters. She has had some worsening weakness recently and family recently found her on the floor because she was too weak to lift herself up. - PT/OT have been consulted.   For questions or updates, please contact [place] Please consult [email or web address] for contact info under        Signed, Darreld Mclean, PA-C  08/26/2022, 10:02 AM    I have personally seen and examined this patient. I agree with the assessment and plan as outlined above.  Many issues as outline above.  Will replace her K and Mg today. Repeat BMET tomorrow Add aldactone.  Medical management of CAD She is not volume overloaded. BNP was high at [place]. Repeat BNP here. Of note, LVEDP not elevated by cath.  PT/OT eval  Lauree Chandler, MD, [facility] 08/26/2022 11:28 AM

## 2022-08-26 NOTE — Progress Notes (Addendum)
Pt O2 sat in the 80's on room air with respiration 22 bpm. Lung sounds clear. Pt denies chest pain, denies nausea and vomiting. Pt was placed on O2 at 2L per [place] but still desating in the 80's, pt's demand for O2 increased to 6L per [place] with O2 sat 93-96%. MD was notified. 0314 MD called back and ordered 40 mg IV Lasix. 0320 Lab called of critical value Potassium 2.7. 0321 MD updated and ordered verbally to hold the IV Lasix instead give 40 mg KCl SA CR tablet PO and 40 mEq KCl IV. Will continue to monitor pt.

## 2022-08-26 NOTE — NC FL2 (Signed)
[place] LEVEL OF CARE FORM     IDENTIFICATION  Patient Name: Joy Daniels Birthdate: [birth year] Sex: female Admission Date (Current Location): 08/25/2022  [facility] and [place] Number:  Herbalist and Address:  [facility] [place] [place] [address] [place], [place] [address]      Provider Number: [id number]  Attending Physician Name and Address:  Jettie Booze, MD  Relative Name and Phone Number:  [name] (Daughter) [phone]    Current Level of Care: Hospital Recommended Level of Care: [place] Prior Approval Number:    Date Approved/Denied:   PASRR Number: [id number]  Discharge Plan: SNF    Current Diagnoses: Patient Active Problem List   Diagnosis Date Noted   Abnormal echocardiogram 08/25/2022   NSTEMI (non-ST elevated myocardial infarction) ([place]) 08/25/2022   Pleural effusion, right 07/07/2021   Hypocalcemia 06/11/2014   Colon cancer ([place]) 12/09/2013   Dyspepsia 06/11/2012   Toxic nodular goiter    Other osteoporosis    GERD (gastroesophageal reflux disease)    Iron deficiency    Pallor    Combined hyperlipidemia    Hypercalcemia    Nontoxic multinodular goiter    Vitamin D deficiency disease    Hyperparathyroidism , secondary, non-renal (HCC)    Thyroiditis, autoimmune    Hypothyroidism, acquired, autoimmune    COPD (chronic obstructive pulmonary disease) ([place])    Dysgeusia    Lymphoma, mantle cell, multiple sites ([place])    Hypertension    Obesity (BMI 30.0-34.9)    THORACIC AORTIC ANEURYSM 10/12/2010   MIXED HYPERLIPIDEMIA 02/11/2010   ESSENTIAL HYPERTENSION, BENIGN 02/11/2010   DYSPNEA 02/11/2010   CHEST PAIN UNSPECIFIED 02/11/2010    Orientation RESPIRATION BLADDER Height & Weight     Self, Time, Situation, Place  O2 (Nasal Cannula 2 liters) External catheter, Incontinent (External Urinary Catheter) Weight: 144 lb 10 oz (65.6 kg) Height:  '5\' 1"'$  (154.9 cm)  BEHAVIORAL  SYMPTOMS/MOOD NEUROLOGICAL BOWEL NUTRITION STATUS      Continent (WDL) Diet (Please see discharge summary)  AMBULATORY STATUS COMMUNICATION OF NEEDS Skin   Limited Assist Verbally Other (Comment) (WDL,Wound/Incision LDAs)                       Personal Care Assistance Level of Assistance  Bathing, Feeding, Dressing Bathing Assistance: Maximum assistance Feeding assistance: Limited assistance Dressing Assistance: Maximum assistance     Functional Limitations Info  Sight, Hearing, Speech Sight Info: Adequate (WDL) Hearing Info: Adequate Speech Info: Adequate    SPECIAL CARE FACTORS FREQUENCY  PT (By licensed PT), OT (By licensed OT)     PT Frequency: 5x min weekly OT Frequency: 5x min weekly            Contractures Contractures Info: Not present    Additional Factors Info  Code Status, Allergies Code Status Info: FULL Allergies Info: Codeine,Latex           Current Medications (08/26/2022):  This is the current hospital active medication list Current Facility-Administered Medications  Medication Dose Route Frequency Provider Last Rate Last Admin   0.9 %  sodium chloride infusion  250 mL Intravenous PRN Jettie Booze, MD       0.9 %  sodium chloride infusion  250 mL Intravenous PRN Jettie Booze, MD       acetaminophen (TYLENOL) tablet 650 mg  650 mg Oral Q4H PRN Lily Kocher, PA-C       aspirin EC tablet 81 mg  81 mg Oral Daily Lily Kocher, PA-C   81 mg at 08/26/22 1012   atorvastatin (LIPITOR) tablet 40 mg  40 mg Oral Daily Lily Kocher, PA-C   40 mg at 08/26/22 1012   carvedilol (COREG) tablet 6.25 mg  6.25 mg Oral BID WC Lily Kocher, PA-C   6.25 mg at 08/26/22 1012   enoxaparin (LOVENOX) injection 40 mg  40 mg Subcutaneous Q24H Franky Macho, RPH   40 mg at 08/25/22 2130   ezetimibe (ZETIA) tablet 10 mg  10 mg Oral Daily Jettie Booze, MD   10 mg at 08/26/22 1012   fesoterodine (TOVIAZ) tablet 4 mg  4 mg Oral Daily Jettie Booze, MD   4 mg at 08/26/22 1012   loratadine (CLARITIN) tablet 10 mg  10 mg Oral Daily Jettie Booze, MD   10 mg at 08/26/22 1013   magnesium sulfate IVPB 4 g 100 mL  4 g Intravenous Once Darreld Mclean, PA-C 50 mL/hr at 08/26/22 1257 4 g at 08/26/22 1257   nitroGLYCERIN (NITROSTAT) SL tablet 0.4 mg  0.4 mg Sublingual Q5 Min x 3 PRN Lily Kocher, PA-C       ondansetron [facility]) injection 4 mg  4 mg Intravenous Q6H PRN Lily Kocher, PA-C       pantoprazole (PROTONIX) EC tablet 40 mg  40 mg Oral Daily Jettie Booze, MD   40 mg at 08/26/22 1012   potassium chloride SA (KLOR-CON M) CR tablet 40 mEq  40 mEq Oral Q4H Sande Rives E, PA-C   40 mEq at 08/26/22 1015   sodium chloride flush (NS) 0.9 % injection 3 mL  3 mL Intravenous Q12H Larae Grooms S, MD   3 mL at 08/26/22 0120   sodium chloride flush (NS) 0.9 % injection 3 mL  3 mL Intravenous PRN Jettie Booze, MD       spironolactone (ALDACTONE) tablet 12.5 mg  12.5 mg Oral Daily Sande Rives E, PA-C   12.5 mg at 08/26/22 1254     Discharge Medications: Please see discharge summary for a list of discharge medications.  Relevant Imaging Results:  Relevant Lab Results:   Additional Information [phone]  Milas Gain, LCSWA

## 2022-08-26 NOTE — TOC Initial Note (Signed)
Transition of Care [facility] [place]) - Initial/Assessment Note    Patient Details  Name: Joy Daniels MRN: [id number] Date of Birth: 1941-05-21  Transition of Care [facility] CM/SW Contact:    Milas Gain, Okay Phone Number: 08/26/2022, 12:47 PM  Clinical Narrative:                  CSW received consult for possible SNF placement at time of discharge. CSW spoke with patient and patients daughter at bedside regarding PT recommendation of SNF placement at time of discharge. Patient reports she is from home with daughter and Lynnette Caffey.Patient expressed understanding of PT recommendation and is agreeable to SNF placement at time of discharge. Patient reports preference for [facility]. Patient with patients daughter gave CSW permission to fax out initial referral near the [facility] area for possible SNF placement.CSW discussed insurance authorization process and will provide Medicare SNF ratings list with accepted SNF bed offers when available. No further questions reported at this time. CSW to continue to follow and assist with discharge planning needs.   Expected Discharge Plan: Skilled Nursing Facility Barriers to Discharge: Continued Medical Work up   Patient Goals and CMS Choice Patient states their goals for this hospitalization and ongoing recovery are:: SNF CMS Medicare.gov Compare Post Acute Care list provided to:: Patient Choice offered to / list presented to : Patient  Expected Discharge Plan and Services Expected Discharge Plan: [place] In-house Referral: Clinical Social Work     Living arrangements for the past 2 months: [place]                                      Prior Living Arrangements/Services Living arrangements for the past 2 months: Single Family Home Lives with:: Adult Children, Other (Comment) (From home with daughter and grandchildren) Patient language and need for interpreter reviewed:: Yes Do you feel safe going back to the  place where you live?: No   SNF  Need for Family Participation in Patient Care: Yes (Comment) Care giver support system in place?: Yes (comment)   Criminal Activity/Legal Involvement Pertinent to Current Situation/Hospitalization: No - Comment as needed  Activities of Daily Living Home Assistive Devices/Equipment: Cane (specify quad or straight), Walker (specify type) (single and 4-point cane. Front and back wheeled waler) ADL Screening (condition at time of admission) Patient's cognitive ability adequate to safely complete daily activities?: No Is the patient deaf or have difficulty hearing?: No Does the patient have difficulty seeing, even when wearing glasses/contacts?: No Does the patient have difficulty concentrating, remembering, or making decisions?: Yes Patient able to express need for assistance with ADLs?: Yes Does the patient have difficulty dressing or bathing?: Yes Independently performs ADLs?: No Communication: Needs assistance Is this a change from baseline?: Pre-admission baseline Dressing (OT): Needs assistance Is this a change from baseline?: Change from baseline, expected to last >3 days Grooming: Needs assistance Is this a change from baseline?: Change from baseline, expected to last >3 days Feeding: Independent Bathing: Needs assistance Is this a change from baseline?: Change from baseline, expected to last >3 days Toileting: Needs assistance Is this a change from baseline?: Change from baseline, expected to last >3days In/Out Bed: Needs assistance Is this a change from baseline?: Change from baseline, expected to last >3 days Walks in Home: Independent (per dtr) Does the patient have difficulty walking or climbing stairs?: Yes Weakness of Legs: Both Weakness of Arms/Hands: Both  Permission Sought/Granted Permission sought to share information with : Case Manager, Family Supports, Chartered certified accountant granted to share information with :  Yes, Verbal Permission Granted  Share Information with NAME: [name]  Permission granted to share info w AGENCY: SNF  Permission granted to share info w Relationship: daughter  Permission granted to share info w Contact Information: [name] [phone]  Emotional Assessment Appearance:: Appears stated age Attitude/Demeanor/Rapport: Gracious Affect (typically observed): Calm Orientation: : Oriented to Self, Oriented to Place, Oriented to  Time, Oriented to Situation Alcohol / Substance Use: Not Applicable Psych Involvement: No (comment)  Admission diagnosis:  Abnormal echocardiogram [R93.1] NSTEMI (non-ST elevated myocardial infarction) [facility]) [I21.4] Patient Active Problem List   Diagnosis Date Noted   Abnormal echocardiogram 08/25/2022   NSTEMI (non-ST elevated myocardial infarction) ([place]) 08/25/2022   Pleural effusion, right 07/07/2021   Hypocalcemia 06/11/2014   Colon cancer ([place]) 12/09/2013   Dyspepsia 06/11/2012   Toxic nodular goiter    Other osteoporosis    GERD (gastroesophageal reflux disease)    Iron deficiency    Pallor    Combined hyperlipidemia    Hypercalcemia    Nontoxic multinodular goiter    Vitamin D deficiency disease    Hyperparathyroidism , secondary, non-renal (HCC)    Thyroiditis, autoimmune    Hypothyroidism, acquired, autoimmune    COPD (chronic obstructive pulmonary disease) ([place])    Dysgeusia    Lymphoma, mantle cell, multiple sites ([place])    Hypertension    Obesity (BMI 30.0-34.9)    THORACIC AORTIC ANEURYSM 10/12/2010   MIXED HYPERLIPIDEMIA 02/11/2010   ESSENTIAL HYPERTENSION, BENIGN 02/11/2010   DYSPNEA 02/11/2010   CHEST PAIN UNSPECIFIED 02/11/2010   PCP:  Garwin Brothers, MD Pharmacy:   [facility] [place], [place] [address] Phone: [phone] Fax: [phone]  [id number] [id number]: [phone]: [phone]    Social Determinants of Health (SDOH) Interventions    Readmission Risk Interventions     No data to display

## 2022-08-27 ENCOUNTER — Inpatient Hospital Stay (HOSPITAL_COMMUNITY): Payer: Medicare HMO

## 2022-08-27 DIAGNOSIS — N179 Acute kidney failure, unspecified: Secondary | ICD-10-CM

## 2022-08-27 DIAGNOSIS — I2510 Atherosclerotic heart disease of native coronary artery without angina pectoris: Secondary | ICD-10-CM | POA: Diagnosis not present

## 2022-08-27 DIAGNOSIS — I5021 Acute systolic (congestive) heart failure: Secondary | ICD-10-CM | POA: Diagnosis not present

## 2022-08-27 DIAGNOSIS — E7849 Other hyperlipidemia: Secondary | ICD-10-CM | POA: Diagnosis not present

## 2022-08-27 DIAGNOSIS — I214 Non-ST elevation (NSTEMI) myocardial infarction: Secondary | ICD-10-CM | POA: Diagnosis not present

## 2022-08-27 DIAGNOSIS — I10 Essential (primary) hypertension: Secondary | ICD-10-CM

## 2022-08-27 LAB — LIPID PANEL
Cholesterol: 88 mg/dL (ref 0–200)
HDL: 29 mg/dL — ABNORMAL LOW (ref >40)
LDL Cholesterol: 45 mg/dL (ref 0–99)
Total CHOL/HDL Ratio: 3 RATIO
Triglycerides: 69 mg/dL (ref <150)
VLDL: 14 mg/dL (ref 0–40)

## 2022-08-27 LAB — BASIC METABOLIC PANEL
Anion gap: 7 (ref 5–15)
Anion gap: 6 (ref 5–15)
BUN: 17 mg/dL (ref 8–23)
BUN: 15 mg/dL (ref 8–23)
CO2: 23 mmol/L (ref 22–32)
CO2: 23 mmol/L (ref 22–32)
Calcium: 9.9 mg/dL (ref 8.9–10.3)
Calcium: 9.6 mg/dL (ref 8.9–10.3)
Chloride: 107 mmol/L (ref 98–111)
Chloride: 107 mmol/L (ref 98–111)
Creatinine, Ser: 1.19 mg/dL — ABNORMAL HIGH (ref 0.44–1.00)
Creatinine, Ser: 1.16 mg/dL — ABNORMAL HIGH (ref 0.44–1.00)
GFR, Estimated: 46 mL/min — ABNORMAL LOW (ref >60)
GFR, Estimated: 47 mL/min — ABNORMAL LOW (ref >60)
Glucose, Bld: 161 mg/dL — ABNORMAL HIGH (ref 70–99)
Glucose, Bld: 110 mg/dL — ABNORMAL HIGH (ref 70–99)
Potassium: 5.6 mmol/L — ABNORMAL HIGH (ref 3.5–5.1)
Potassium: 4.6 mmol/L (ref 3.5–5.1)
Sodium: 137 mmol/L (ref 135–145)
Sodium: 136 mmol/L (ref 135–145)

## 2022-08-27 LAB — MAGNESIUM: Magnesium: 2.2 mg/dL (ref 1.7–2.4)

## 2022-08-27 LAB — LIPOPROTEIN A (LPA): Lipoprotein (a): 24.8 nmol/L (ref <75.0)

## 2022-08-27 MED ORDER — FUROSEMIDE 40 MG PO TABS
40.0000 mg | ORAL_TABLET | Freq: Every day | ORAL | Status: DC
  Administered 2022-08-27 – 2022-08-28 (×2): 40 mg via ORAL
  Filled 2022-08-27 (×2): qty 1

## 2022-08-27 MED ORDER — FUROSEMIDE 10 MG/ML IJ SOLN: 20.0000 mg | Freq: Once | INTRAMUSCULAR | Status: DC

## 2022-08-27 MED ORDER — FUROSEMIDE 10 MG/ML IJ SOLN
20.0000 mg | Freq: Once | INTRAMUSCULAR | Status: DC
  Filled 2022-08-27: qty 2

## 2022-08-27 NOTE — TOC Progression Note (Signed)
Transition of Care [facility]) - Progression Note    Patient Details  Name: Joy Daniels MRN: [id number] Date of Birth: 1941-02-17  Transition of Care [facility]) CM/SW [place], [place] Phone Number: 08/27/2022, 2:25 PM  Clinical Narrative:      Due to patients current orientation CSW spoke with patients daughter Joy Daniels and provided patients SNF bed offers. Patients daughter accepted SNF bed offer with [facility]. Olivia Mackie with [facility] confirmed they will have a SNF bed on Monday if patient medically ready for dc. CSW informed MD. CSW following to start insurance authorization close to patient being medically ready for dc. CSW will continue to follow and assist with patients dc planning needs.   Expected Discharge Plan: [place] Barriers to Discharge: Continued Medical Work up  Expected Discharge Plan and Services Expected Discharge Plan: [place] In-house Referral: Clinical Social Work     Living arrangements for the past 2 months: Single Family Home                                       Social Determinants of Health (SDOH) Interventions    Readmission Risk Interventions     No data to display

## 2022-08-27 NOTE — Progress Notes (Signed)
Progress Note  Patient Name: Joy Daniels Date of Encounter: 08/27/2022  Primary Cardiologist: None  Subjective   No acute events overnight. She received potassium supplements 40 mEq 3 doses yesterday and spironolactone 12.5 mg was started. Losartan was held due to abnormal kidney function.  Inpatient Medications    Scheduled Meds:  aspirin EC  81 mg Oral Daily   atorvastatin  40 mg Oral Daily   carvedilol  6.25 mg Oral BID WC   enoxaparin (LOVENOX) injection  40 mg Subcutaneous Q24H   ezetimibe  10 mg Oral Daily   fesoterodine  4 mg Oral Daily   furosemide  20 mg Intravenous Once   loratadine  10 mg Oral Daily   pantoprazole  40 mg Oral Daily   sodium chloride flush  3 mL Intravenous Q12H   spironolactone  12.5 mg Oral Daily   Continuous Infusions:  sodium chloride     sodium chloride     PRN Meds: sodium chloride, sodium chloride, acetaminophen, nitroGLYCERIN, ondansetron (ZOFRAN) IV, sodium chloride flush   Vital Signs    Vitals:   08/27/22 0049 08/27/22 0626 08/27/22 1002 08/27/22 1121  BP: 118/65 121/70 136/78 135/72  Pulse: 78 78 81 79  Resp: '20 17  20  '$ Temp:  98.4 F (36.9 C)  98.1 F (36.7 C)  TempSrc:  Oral  Oral  SpO2: 95% 98%  93%  Weight:  66.9 kg    Height:        Intake/Output Summary (Last 24 hours) at 08/27/2022 1138 Last data filed at 08/27/2022 1123 Gross per 24 hour  Intake 100 ml  Output 1500 ml  Net -1400 ml   Filed Weights   08/25/22 1706 08/26/22 0348 08/27/22 0626  Weight: 64.2 kg 65.6 kg 66.9 kg    Telemetry   Personally reviewed, NSR and HR controlled  ECG    NSR  Physical Exam   GEN: No acute distress.   Neck: No JVD. Cardiac: RRR, no murmur, rub, or gallop.  Respiratory: Shortness of breath at rest GI: Soft, nontender, bowel sounds present. MS: No edema; No deformity. Neuro:  Nonfocal. Psych: Alert and oriented x 3. Normal affect.  Labs    Chemistry Recent Labs  Lab 08/25/22 1739 08/26/22 0217  08/27/22 0148  NA  --  136 137  K  --  2.7* 5.6*  CL  --  104 107  CO2  --  18* 23  GLUCOSE  --  133* 161*  BUN  --  14 17  CREATININE 1.11* 1.19* 1.19*  CALCIUM  --  9.3 9.9  GFRNONAA 50* 46* 46*  ANIONGAP  --  14 7     Hematology Recent Labs  Lab 08/25/22 1958 08/26/22 0217  WBC 5.5 5.5  RBC 4.27 4.57  HGB 12.4 13.3  HCT 37.1 39.7  MCV 86.9 86.9  MCH 29.0 29.1  MCHC 33.4 33.5  RDW 13.0 13.1  PLT 204 180    Cardiac EnzymesNo results for input(s): "TROPONINIHS" in the last 720 hours.  BNP Recent Labs  Lab 08/26/22 1517  BNP 1,066.0*     DDimerNo results for input(s): "DDIMER" in the last 168 hours.   Radiology    CARDIAC CATHETERIZATION  Result Date: 08/25/2022   1st Diag lesion is 50% stenosed.   Mid RCA lesion is 70% stenosed.   The left ventricular systolic function is normal.   LV end diastolic pressure is normal.   The left ventricular ejection fraction is 50-55% by  visual estimate.   There is no aortic valve stenosis. Small vessel disease.  No PCI targets.  Medical therapy.    Cardiac Studies     1st Diag lesion is 50% stenosed.   Mid RCA lesion is 70% stenosed.   The left ventricular systolic function is normal.   LV end diastolic pressure is normal.   The left ventricular ejection fraction is 50-55% by visual estimate.   There is no aortic valve stenosis.  Assessment & Plan   Patient is a 81 year old F known to have HTN, DM 2, hypothyroidism, COPD, mantle cell lymphoma, colon cancer s/p resection and primary anastomosis who presented to [facility] on [facility]2022 with symptoms of chest pain associated with SOB and weakness. Troponin IV 0.67>> 0.40.  proBNP 24,200.  Echo showed LVEF 40 to 45%. Cardiology was consulted and recommended transfer to [facility] for cardiac cath.  # NSTEMI -LHC showed small vessel disease with 50% stenosis of first diagonal and 70% stenosis of mid RCA. Does not have PCI targets and medical therapy was recommended.  No recurrent chest pain.  Continue aspirin 81 mg once daily, Coreg 6.25 mg twice daily and high intensity statin.  # Acute systolic heart failure -Echo showed LVEF 40 to 45% but LHC showed possible LVEF 50 to 55%. LV end-diastolic pressure was normal. Patient appears to have SOB at rest but has baseline COPD as well and currently on oxygen. Will obtain C x-ray and administer spot dosing of Lasix if vascular congestion.  Her proBNP triangle was 24,000 and BNP obtained yesterday was 1000.  She did not get any Lasix in the interval time. -Continue spironolactone 12.5 mg once daily. Obtain BMP in the afternoon. If potassium continues to be elevated, will hold off on spironolactone. -Continue to hold losartan due to hyperkalemia  # HTN, controlled -Continue Coreg 6.25 mg twice daily -Continue spironolactone 12.5 mg once daily  # HLD, LDL 45 in 08/2022 -On pravastatin at home.  This was stopped and she has been started on Lipitor 40 mg nightly. Continue home Zetia.  # AKI, improving -Creatinine 2 on admission. Baseline around 0.8-0.9. She was gently hydrated with IV fluids around the hospital and did not get any Lasix throughout the hospitalization.  I have spent a total of 33 minutes with patient reviewing chart , telemetry, EKGs, labs and examining patient as well as establishing an assessment and plan that was discussed with the patient.  > 50% of time was spent in direct patient care.      Signed, Chalmers Guest, MD  08/27/2022, 11:38 AM

## 2022-08-28 DIAGNOSIS — I214 Non-ST elevation (NSTEMI) myocardial infarction: Secondary | ICD-10-CM | POA: Diagnosis not present

## 2022-08-28 DIAGNOSIS — I5021 Acute systolic (congestive) heart failure: Secondary | ICD-10-CM | POA: Diagnosis not present

## 2022-08-28 DIAGNOSIS — E7849 Other hyperlipidemia: Secondary | ICD-10-CM | POA: Diagnosis not present

## 2022-08-28 DIAGNOSIS — I2510 Atherosclerotic heart disease of native coronary artery without angina pectoris: Secondary | ICD-10-CM | POA: Diagnosis not present

## 2022-08-28 MED ORDER — FUROSEMIDE 10 MG/ML IJ SOLN
20.0000 mg | Freq: Two times a day (BID) | INTRAMUSCULAR | Status: DC
  Administered 2022-08-28 – 2022-08-29 (×2): 20 mg via INTRAVENOUS
  Filled 2022-08-28 (×2): qty 2

## 2022-08-28 NOTE — Progress Notes (Addendum)
   Patient's daughter is at bedside, asked what we are doing to treat patient's pneumonia. Daughter reports that the patient had been receiving antibiotics for pneumonia while at [facility], but has not been receiving them here at [facility].   Reviewed chart and discussed with Lily Kocher PA-C who reviewed chart when patient came over from [place]. No mention of antibiotics or pneumonia in the chart from [facility]. Ellard Artis had called the charge nurse at [facility] who had taken care of the patient, and she reported that the antibiotics were empiric. CT on 12/10 at [facility] showed stable peripheral scarring in the right upper lobe, stable band of scarring in the right lower lobe, and a pleural base nodule in the left lower lobe.   Today, patient reports that she only occasionally has a cough, and she does not produce sputum when she coughs. WBC was 5.5. on 12/14 and 12/15. Patient is afebrile.   CXR yesterday showed worsening airspace disease at the left>right lung base, atelectasis vs pneumonia. Also showed cardiomegaly with central vascular congestion. Possible that CXR findings are all related to fluid vs scarring (seen on CT as above). Patient is receiving lasix to diurese.   I will order a CBC to be drawn tomorrow AM to see if patient has had an elevation in her WBC. Consider repeat CXR vs CT in AM if breathing status does not improve with diuresis.   Margie Billet, PA-C 08/28/2022 4:03 PM

## 2022-08-28 NOTE — Progress Notes (Signed)
Progress Note  Patient Name: Joy Daniels Date of Encounter: 08/28/2022  Primary Cardiologist: None  Subjective   No acute events overnight. Patient received p.o. Lasix 40 mg yesterday after chest x-ray showed vascular congestion. She was also noted to have increased O2 requirements. Today she complains of shortness of breath at rest even with eating food.  Inpatient Medications    Scheduled Meds:  aspirin EC  81 mg Oral Daily   atorvastatin  40 mg Oral Daily   carvedilol  6.25 mg Oral BID WC   enoxaparin (LOVENOX) injection  40 mg Subcutaneous Q24H   ezetimibe  10 mg Oral Daily   fesoterodine  4 mg Oral Daily   furosemide  20 mg Intravenous BID   loratadine  10 mg Oral Daily   pantoprazole  40 mg Oral Daily   sodium chloride flush  3 mL Intravenous Q12H   spironolactone  12.5 mg Oral Daily   Continuous Infusions:  sodium chloride     sodium chloride     PRN Meds: sodium chloride, sodium chloride, acetaminophen, nitroGLYCERIN, ondansetron (ZOFRAN) IV, sodium chloride flush   Vital Signs    Vitals:   08/27/22 1758 08/27/22 1935 08/28/22 0527 08/28/22 0700  BP:  102/67 126/66 119/72  Pulse: 78 76 75 71  Resp:  '20 20 20  '$ Temp:  98.2 F (36.8 C) 98.3 F (36.8 C) 98.4 F (36.9 C)  TempSrc:  Oral Oral Oral  SpO2:  92% 92% 95%  Weight:   65.6 kg   Height:        Intake/Output Summary (Last 24 hours) at 08/28/2022 1022 Last data filed at 08/28/2022 0531 Gross per 24 hour  Intake --  Output 1500 ml  Net -1500 ml   Filed Weights   08/26/22 0348 08/27/22 0626 08/28/22 0527  Weight: 65.6 kg 66.9 kg 65.6 kg    Telemetry   Personally reviewed, NSR and HR controlled  ECG    NSR  Physical Exam   GEN: No acute distress.   Neck: JVD elevated just below the angle of mandible Cardiac: RRR, no murmur, rub, or gallop.  Respiratory: Shortness of breath at rest, bilateral Rales GI: Soft, nontender, bowel sounds present. MS: No edema; No deformity. Neuro:   Nonfocal. Psych: Alert and oriented x 3. Normal affect.  Labs    Chemistry Recent Labs  Lab 08/26/22 0217 08/27/22 0148 08/27/22 1459  NA 136 137 136  K 2.7* 5.6* 4.6  CL 104 107 107  CO2 18* 23 23  GLUCOSE 133* 161* 110*  BUN '14 17 15  '$ CREATININE 1.19* 1.19* 1.16*  CALCIUM 9.3 9.9 9.6  GFRNONAA 46* 46* 47*  ANIONGAP '14 7 6     '$ Hematology Recent Labs  Lab 08/25/22 1958 08/26/22 0217  WBC 5.5 5.5  RBC 4.27 4.57  HGB 12.4 13.3  HCT 37.1 39.7  MCV 86.9 86.9  MCH 29.0 29.1  MCHC 33.4 33.5  RDW 13.0 13.1  PLT 204 180    Cardiac EnzymesNo results for input(s): "TROPONINIHS" in the last 720 hours.  BNP Recent Labs  Lab 08/26/22 1517  BNP 1,066.0*     DDimerNo results for input(s): "DDIMER" in the last 168 hours.   Radiology    DG CHEST PORT 1 VIEW  Result Date: 08/27/2022 CLINICAL DATA:  Shortness of breath EXAM: PORTABLE CHEST 1 VIEW COMPARISON:  Chest x-ray 08/22/2022, CT 08/21/2022 FINDINGS: Right-sided central venous port tip over the SVC region allowing for patient rotation. Enlarged cardiomediastinal  silhouette with possible left effusion. Central vascular congestion. Worsening airspace disease at the left greater than right lung base. Right hilar prominence likely augmented by portable technique and rotation, no obvious mass seen on the CT from earlier this month. IMPRESSION: 1. Worsening airspace disease at the left greater than right lung base, atelectasis versus pneumonia. 2. Cardiomegaly with central vascular congestion. Possible left effusion. 3. Right hilar prominence likely augmented by portable technique and rotation. Consider follow-up two-view chest when clinically feasible Electronically Signed   By: Donavan Foil M.D.   On: 08/27/2022 17:41    Cardiac Studies     1st Diag lesion is 50% stenosed.   Mid RCA lesion is 70% stenosed.   The left ventricular systolic function is normal.   LV end diastolic pressure is normal.   The left ventricular  ejection fraction is 50-55% by visual estimate.   There is no aortic valve stenosis.  Assessment & Plan   Patient is a 81 year old F known to have HTN, DM 2, hypothyroidism, COPD, mantle cell lymphoma, colon cancer s/p resection and primary anastomosis who presented to [facility] on 08/21/2022 with symptoms of chest pain associated with SOB and weakness. Troponin IV 0.67>> 0.40.  proBNP 24,200.  Echo showed LVEF 40 to 45%. Cardiology was consulted and recommended transfer to [facility] for cardiac cath.  # NSTEMI -LHC showed small vessel disease with 50% stenosis of first diagonal and 70% stenosis of mid RCA. Does not have PCI targets and medical therapy was recommended. No recurrent chest pain. Continue aspirin 81 mg once daily, Coreg 6.25 mg twice daily and high intensity statin.  # Acute systolic heart failure -Echo showed LVEF 40 to 45% but LHC showed possible LVEF 50 to 55%.  LV Pressure was normal on the cath. Patient has SOB at rest, JVD elevated just below the angle of mandible, has bilateral rales and her O2 requirements went up (she is not on home oxygen, desaturated to 84% at rest yesterday).  Lasix 40 mg p.o. dose was ordered yesterday due to no IV access.  Now that she has gotten the IV access, will switch to p.o. to IV Lasix.  Start IV Lasix 20 mg twice daily. -Continue Coreg 6.25 mg twice daily -Will start losartan once she is compensated -Continue spironolactone 12.5 mg once daily  # HTN, controlled -Continue Coreg 6.25 mg twice daily -Continue spironolactone 12.5 mg once daily  # HLD, LDL 45 in 08/2022 -On pravastatin at home.  This was stopped and she has been started on Lipitor 40 mg nightly.  Continue home Zetia.  # AKI, improving -Creatinine 2 on admission.  Baseline around 0.8-0.9.  I have spent a total of 35 minutes with patient reviewing chart , telemetry, EKGs, labs and examining patient as well as establishing an assessment and plan that was discussed with  the patient.  > 50% of time was spent in direct patient care.      Signed, Chalmers Guest, MD  08/28/2022, 10:22 AM

## 2022-08-29 ENCOUNTER — Ambulatory Visit: Payer: Medicare HMO | Admitting: Internal Medicine

## 2022-08-29 DIAGNOSIS — R5383 Other fatigue: Secondary | ICD-10-CM

## 2022-08-29 DIAGNOSIS — I2510 Atherosclerotic heart disease of native coronary artery without angina pectoris: Secondary | ICD-10-CM | POA: Diagnosis not present

## 2022-08-29 DIAGNOSIS — R7989 Other specified abnormal findings of blood chemistry: Secondary | ICD-10-CM

## 2022-08-29 DIAGNOSIS — I5041 Acute combined systolic (congestive) and diastolic (congestive) heart failure: Secondary | ICD-10-CM | POA: Diagnosis not present

## 2022-08-29 DIAGNOSIS — R0902 Hypoxemia: Secondary | ICD-10-CM | POA: Diagnosis not present

## 2022-08-29 DIAGNOSIS — R531 Weakness: Secondary | ICD-10-CM | POA: Diagnosis not present

## 2022-08-29 DIAGNOSIS — I5022 Chronic systolic (congestive) heart failure: Secondary | ICD-10-CM | POA: Diagnosis not present

## 2022-08-29 DIAGNOSIS — R262 Difficulty in walking, not elsewhere classified: Secondary | ICD-10-CM | POA: Diagnosis not present

## 2022-08-29 DIAGNOSIS — I214 Non-ST elevation (NSTEMI) myocardial infarction: Secondary | ICD-10-CM | POA: Diagnosis not present

## 2022-08-29 DIAGNOSIS — N1831 Chronic kidney disease, stage 3a: Secondary | ICD-10-CM | POA: Diagnosis not present

## 2022-08-29 DIAGNOSIS — Z7401 Bed confinement status: Secondary | ICD-10-CM | POA: Diagnosis not present

## 2022-08-29 LAB — CBC
HCT: 36.9 % (ref 36.0–46.0)
Hemoglobin: 12.5 g/dL (ref 12.0–15.0)
MCH: 29.3 pg (ref 26.0–34.0)
MCHC: 33.9 g/dL (ref 30.0–36.0)
MCV: 86.6 fL (ref 80.0–100.0)
Platelets: 214 10*3/uL (ref 150–400)
RBC: 4.26 MIL/uL (ref 3.87–5.11)
RDW: 13.2 % (ref 11.5–15.5)
WBC: 6.6 10*3/uL (ref 4.0–10.5)
nRBC: 0 % (ref 0.0–0.2)

## 2022-08-29 MED ORDER — SPIRONOLACTONE 25 MG PO TABS: 12.5000 mg | ORAL_TABLET | Freq: Every day | ORAL | Status: DC

## 2022-08-29 MED ORDER — CARVEDILOL 6.25 MG PO TABS: 6.2500 mg | ORAL_TABLET | Freq: Two times a day (BID) | ORAL | Status: DC

## 2022-08-29 NOTE — Care Management Important Message (Signed)
Important Message  Patient Details  Name: Joy Daniels MRN: [id number] Date of Birth: 19-Mar-1941   Medicare Important Message Given:  Yes     Shelda Altes 08/29/2022, 12:44 PM

## 2022-08-29 NOTE — Discharge Summary (Addendum)
Discharge Summary    Patient ID: JOCELINE HINCHCLIFF MRN: [id number]; DOB: Jan 29, 1941  Admit date: 08/25/2022 Discharge date: 08/29/2022  PCP:  [facility], [place] Providers Cardiologist:  [place] [facility]  Discharge Diagnoses    Principal Problem:   Elevated troponin Active Problems:   Mixed hyperlipidemia   Essential hypertension, benign   COPD (chronic obstructive pulmonary disease) ([place])   Abnormal echocardiogram Diagnostic Studies/Procedures    Cath: 08/25/2022       1st Diag lesion is 50% stenosed.   Mid RCA lesion is 70% stenosed.   The left ventricular systolic function is normal.   LV end diastolic pressure is normal.   The left ventricular ejection fraction is 50-55% by visual estimate.   There is no aortic valve stenosis.   Small vessel disease.  No PCI targets.  Medical therapy.   Diagnostic Dominance: Left    08/22/22 TTE from [facility]   Patient has normal left ventricular chamber size. Ejection fraction 40-45%. Regional wall motion abnormalities noted. Right ventricle mildly enlarged and right ventricular global systolic function moderately reduced. Mild-to-moderate tricuspid valve regurgitation. Pressures 38 mmHg. _____________   History of Present Illness     TRAYONNA BACHMEIER is a 81 y.o. female with hx of hyperthyroidism, hypertension, COPD, DM, anemia, mantle cell lymphoma (2011 dx), colon cancer (s/p resection and primary anastomosis), COPD who was seen 08/25/2022 for LHC due to finding of new systolic CHF.   Ms. Gough presented to [facility] ED on 12/10 with symptoms of constipation x3 days and new onset abdominal pain/chest pain. Patient described poor oral intake over several days secondary to what she felt was constipation. On the day of presentation to the ED, she awoke with abdominal and chest pain as well as shortness of breath. Patient's daughter reported that her mother seem abnormally weak later that afternoon and patient was  brought to the ED. It was also noted that patient seemed to be having some memory issue, not normal for her. Prior to the 3 days of constipation, it was reported that patient felt like herself.  An additional notes from [place], it was reported that the patient was apparently found on the floor over the weekend but not after a fall or passing out.  Patient lives with her daughter and it was noted that the patient has not been eating well at home, only drinking chocolate milk shakes and eating donuts.  It was later clarified that the patient seemed to be having left chest pain with tenderness to palpation.   In the ED at [facility], labs notable for pro BNP 24200, Troponin (non-high sensitivity) 0.67, WBC 13.3, Creatinine 2.0. She was also noted to be hypoxic with O2 saturation in the 80s. Repeat troponin 0.62, 0.40. EKG without acute ischemic changes. Patient admitted by hospitalist. CT chest/abd/pelvis wo contrast notable for new pleural base nodule in LLL measuring 1.8 by 1.6cm with adjacent 0.8 by 0.7 cm subpleural nodularity. Metastatic disease not excluded. New peripheral airspace opacity also noted in posterior upper right lobe inflammatory vs neoplastic.  Coronary atherosclerotic disease also reported on CT.  Hospital notes indicate that the patient received IV Lasix but subsequently had a rather dramatic drop in blood pressure and was started on IV fluids.  Patient also initiated on IV heparin and given aspirin, Coreg.  Patient with decreased LV function, reportedly around 40 to 45% with some regional wall motion abnormality noted (full echo report not transmitted, unable to see specifics) RV dysfunction and  RV dilation also noted.  While the initial suspicion was heart failure given elevated BNP, it appeared to the renal hospitalist that patient was dehydrated and she was continued to be managed with IV fluids.  Further diuresis was discontinued.  Patient's physical exam continued to be mixed with small  pleural effusions on chest x-ray and crackles noted on lung auscultation.  Appeared she also received empiric Rocephin 1g x3 doses. Patient with improving creatinine following IV fluid administration.  On the second day of admission, patient actually became hypertensive following IV fluid and held home losartan 100 mg.  Rate increased to 6.25 mg twice daily.  On 12/13, patient was without cardiac symptoms or complaints.  She appeared to be hemodynamically stable.  However she did continue to have tenderness to palpation on left chest wall. Creatinine returned to near baseline at 0.9 and patient was initiated on Entresto. Cardiac catheterization was discussed with family and they agreed to transfer to [facility] for LHC.   Hospital Course     NSTEMI -- Troponin I 0.67 while at [facility]. Transferred to [facility] and underwent cardiac cath noted above with small vessel disease with 50% stenosis of first diagonal and 70% stenosis of mid RCA. Does not have PCI targets and medical therapy was recommended.  -- continue ASA, statin, coreg   HFmrEF -- Echo from [facility] 40-45%, LV gram 50-55% -- she was placed on IV lasix '20mg'$  BID, net - 2.5L, weight trending down. Significant volume improvement on exam.  -- continue coreg 6.'25mg'$  BID, spiro 12.'5mg'$  daily. With softer BPs will hold on additional therapy   HTN -- stable, soft at times -- continue coreg 6.'25mg'$  BID, spiro 12.'5mg'$  daily   HLD -- on pravastatin, Zetia   Hypoxia COPD? -- required on O2 throughout admission. Family did report a dx of COPD in years past -- she was able to maintain a stable O2 sat ion supplement O2 -- follow up with PCP regarding further management along with attempts to wean O2  Patient was seen by Dr. Irish Lack and deemed stable for discharge. DC to SNF. Follow up appt arranged in the office.   Did the patient have an acute coronary syndrome (MI, NSTEMI, STEMI, etc) this admission?:  Yes                               AHA/ACC Clinical  Performance & Quality Measures: Aspirin prescribed? - Yes ADP Receptor Inhibitor (Plavix/Clopidogrel, Brilinta/Ticagrelor or Effient/Prasugrel) prescribed (includes medically managed patients)? - No - no culprit lesion, or PCI done Beta Blocker prescribed? - Yes High Intensity Statin (Lipitor 40-'80mg'$  or Crestor 20-'40mg'$ ) prescribed? - No - intolerant  EF assessed during THIS hospitalization? - Yes For EF <40%, was ACEI/ARB prescribed? - Not Applicable (EF >/= 16%) For EF <40%, Aldosterone Antagonist (Spironolactone or Eplerenone) prescribed? - Not Applicable (EF >/= 10%) Cardiac Rehab Phase II ordered (including medically managed patients)? - No - no PCI          _____________  Discharge Vitals Blood pressure 116/63, pulse 74, temperature 97.7 F (36.5 C), temperature source Oral, resp. rate 20, height '5\' 1"'$  (1.549 m), weight 62.5 kg, SpO2 91 %.  Filed Weights   08/27/22 0626 08/28/22 0527 08/29/22 0459  Weight: 66.9 kg 65.6 kg 62.5 kg    Labs & Radiologic Studies    CBC Recent Labs    08/29/22 0256  WBC 6.6  HGB 12.5  HCT 36.9  MCV 86.6  PLT 720   Basic Metabolic Panel Recent Labs    08/27/22 0148 08/27/22 1459  NA 137 136  K 5.6* 4.6  CL 107 107  CO2 23 23  GLUCOSE 161* 110*  BUN 17 15  CREATININE 1.19* 1.16*  CALCIUM 9.9 9.6  MG 2.2  --    Liver Function Tests No results for input(s): "AST", "ALT", "ALKPHOS", "BILITOT", "PROT", "ALBUMIN" in the last 72 hours. No results for input(s): "LIPASE", "AMYLASE" in the last 72 hours. High Sensitivity Troponin:   No results for input(s): "TROPONINIHS" in the last 720 hours.  BNP Invalid input(s): "POCBNP" D-Dimer No results for input(s): "DDIMER" in the last 72 hours. Hemoglobin A1C No results for input(s): "HGBA1C" in the last 72 hours. Fasting Lipid Panel Recent Labs    08/27/22 0148  CHOL 88  HDL 29*  LDLCALC 45  TRIG 69  CHOLHDL 3.0   Thyroid Function Tests No results for input(s): "TSH", "T4TOTAL",  "T3FREE", "THYROIDAB" in the last 72 hours.  Invalid input(s): "FREET3" _____________  DG CHEST PORT 1 VIEW  Result Date: 08/27/2022 CLINICAL DATA:  Shortness of breath EXAM: PORTABLE CHEST 1 VIEW COMPARISON:  Chest x-ray 08/22/2022, CT 08/21/2022 FINDINGS: Right-sided central venous port tip over the SVC region allowing for patient rotation. Enlarged cardiomediastinal silhouette with possible left effusion. Central vascular congestion. Worsening airspace disease at the left greater than right lung base. Right hilar prominence likely augmented by portable technique and rotation, no obvious mass seen on the CT from earlier this month. IMPRESSION: 1. Worsening airspace disease at the left greater than right lung base, atelectasis versus pneumonia. 2. Cardiomegaly with central vascular congestion. Possible left effusion. 3. Right hilar prominence likely augmented by portable technique and rotation. Consider follow-up two-view chest when clinically feasible Electronically Signed   By: Donavan Foil M.D.   On: 08/27/2022 17:41   CARDIAC CATHETERIZATION  Result Date: 08/25/2022   1st Diag lesion is 50% stenosed.   Mid RCA lesion is 70% stenosed.   The left ventricular systolic function is normal.   LV end diastolic pressure is normal.   The left ventricular ejection fraction is 50-55% by visual estimate.   There is no aortic valve stenosis. Small vessel disease.  No PCI targets.  Medical therapy.   Disposition   Pt is being discharged home today in good condition.  Follow-up Plans & Appointments     Follow-up Information     Elgie Collard, PA-C Follow up on 09/02/2022.   Specialty: Cardiology Why: at 10:55am for your follow up appt Contact information: [employer] [place] [address] [phone]                Discharge Instructions     Call MD for:  redness, tenderness, or signs of infection (pain, swelling, redness, odor or green/yellow discharge around incision  site)   Complete by: As directed    Diet - low sodium heart healthy   Complete by: As directed    Discharge instructions   Complete by: As directed    Radial Site Care Refer to this sheet in the next few weeks. These instructions provide you with information on caring for yourself after your procedure. Your caregiver may also give you more specific instructions. Your treatment has been planned according to current medical practices, but problems sometimes occur. Call your caregiver if you have any problems or questions after your procedure. HOME CARE INSTRUCTIONS You may shower the day after the procedure. Remove  the bandage (dressing) and gently wash the site with plain soap and water. Gently pat the site dry.  Do not apply powder or lotion to the site.  Do not submerge the affected site in water for 3 to 5 days.  Inspect the site at least twice daily.  Do not flex or bend the affected arm for 24 hours.  No lifting over 5 pounds (2.3 kg) for 5 days after your procedure.  Do not drive home if you are discharged the same day of the procedure. Have someone else drive you.  You may drive 24 hours after the procedure unless otherwise instructed by your caregiver.  What to expect: Any bruising will usually fade within 1 to 2 weeks.  Blood that collects in the tissue (hematoma) may be painful to the touch. It should usually decrease in size and tenderness within 1 to 2 weeks.  SEEK IMMEDIATE MEDICAL CARE IF: You have unusual pain at the radial site.  You have redness, warmth, swelling, or pain at the radial site.  You have drainage (other than a small amount of blood on the dressing).  You have chills.  You have a fever or persistent symptoms for more than 72 hours.  You have a fever and your symptoms suddenly get worse.  Your arm becomes pale, cool, tingly, or numb.  You have heavy bleeding from the site. Hold pressure on the site.   Increase activity slowly   Complete by: As directed          Discharge Medications   Allergies as of 08/29/2022       Reactions   Codeine Other (See Comments)   unk   Latex Rash        Medication List     STOP taking these medications    losartan 100 MG tablet Commonly known as: COZAAR       TAKE these medications    aspirin 81 MG chewable tablet Chew 81 mg by mouth every evening.   calcitRIOL 0.25 MCG capsule Commonly known as: ROCALTROL Take by mouth.   carvedilol 6.25 MG tablet Commonly known as: COREG Take 1 tablet (6.25 mg total) by mouth 2 (two) times daily with a meal.   ezetimibe 10 MG tablet Commonly known as: ZETIA Take 10 mg by mouth every evening.   fesoterodine 4 MG Tb24 tablet Commonly known as: TOVIAZ Take 1 tablet (4 mg total) by mouth daily.   pravastatin 40 MG tablet Commonly known as: PRAVACHOL Take 40 mg by mouth every evening.   spironolactone 25 MG tablet Commonly known as: ALDACTONE Take 0.5 tablets (12.5 mg total) by mouth daily. Start taking on: August 30, 2022       Outstanding Labs/Studies   BMET at follow up appt  Duration of Discharge Encounter   Greater than 30 minutes including physician time.  Signed, Reino Bellis, NP 08/29/2022, 12:05 PM  I have examined the patient and reviewed assessment and plan and discussed with patient.  Agree with above as stated.     Patient not reporting shortness of breath.  She feels tired.  Oxygen saturations 95% on current supplemental oxygen.  In discussion with the family, the daughter did state that she had been diagnosed with COPD years ago, but no tests were done at the time.  She was just given a nebulizer.     She does not grossly appear volume overloaded.  LVEDP was not significantly elevated.  BNP was elevated.  There may be some component  of volume overload.  Likely combined systolic/diastolic heart failure.  No significant CAD to explain decreased LVEF.  Continue medical therapy.  Plan to have her discharged to SNF as  she had significant weakness and falls risk.  Hopeful for discharge later today.  She will need follow-up of her electrolytes.  Attempts at weaning oxygen would be reasonable as well.  She will follow-up with her primary care doctor to optimize medications for presumed COPD.   Larae Grooms

## 2022-08-29 NOTE — Progress Notes (Addendum)
Rounding Note    Patient Name: Joy Daniels Date of Encounter: 08/29/2022  [place] Cardiologist: None   Subjective   Sitting up in bed. No complaints. Still requiring O2  Inpatient Medications    Scheduled Meds:  aspirin EC  81 mg Oral Daily   atorvastatin  40 mg Oral Daily   carvedilol  6.25 mg Oral BID WC   enoxaparin (LOVENOX) injection  40 mg Subcutaneous Q24H   ezetimibe  10 mg Oral Daily   fesoterodine  4 mg Oral Daily   furosemide  20 mg Intravenous BID   loratadine  10 mg Oral Daily   pantoprazole  40 mg Oral Daily   sodium chloride flush  3 mL Intravenous Q12H   spironolactone  12.5 mg Oral Daily   Continuous Infusions:  sodium chloride     sodium chloride     PRN Meds: sodium chloride, sodium chloride, acetaminophen, nitroGLYCERIN, ondansetron (ZOFRAN) IV, sodium chloride flush   Vital Signs    Vitals:   08/29/22 0105 08/29/22 0428 08/29/22 0459 08/29/22 0751  BP: 120/65 108/66  116/63  Pulse: 75 74  74  Resp: 20 20 (!) 21 20  Temp: 97.9 F (36.6 C) 97.7 F (36.5 C)  97.7 F (36.5 C)  TempSrc: Oral Oral  Oral  SpO2: 91% 91%  91%  Weight:   62.5 kg   Height:        Intake/Output Summary (Last 24 hours) at 08/29/2022 0923 Last data filed at 08/29/2022 0500 Gross per 24 hour  Intake 150 ml  Output 2200 ml  Net -2050 ml      08/29/2022    4:59 AM 08/28/2022    5:27 AM 08/27/2022    6:26 AM  Last 3 Weights  Weight (lbs) 137 lb 12.6 oz 144 lb 10 oz 147 lb 7.8 oz  Weight (kg) 62.5 kg 65.6 kg 66.9 kg      Telemetry    Sinus Rhythm - Personally Reviewed  ECG    No new tracing  Physical Exam   GEN: No acute distress.   Neck: No JVD Cardiac: RRR, no murmurs, rubs, or gallops.  Respiratory: Clear to auscultation bilaterally. GI: Soft, nontender, non-distended  MS: No edema; No deformity. Neuro:  Nonfocal  Psych: Normal affect   Labs    High Sensitivity Troponin:  No results for input(s): "TROPONINIHS" in the  last 720 hours.   Chemistry Recent Labs  Lab 08/26/22 0217 08/27/22 0148 08/27/22 1459  NA 136 137 136  K 2.7* 5.6* 4.6  CL 104 107 107  CO2 18* 23 23  GLUCOSE 133* 161* 110*  BUN '14 17 15  '$ CREATININE 1.19* 1.19* 1.16*  CALCIUM 9.3 9.9 9.6  MG 1.3* 2.2  --   GFRNONAA 46* 46* 47*  ANIONGAP '14 7 6    '$ Lipids  Recent Labs  Lab 08/27/22 0148  CHOL 88  TRIG 69  HDL 29*  LDLCALC 45  CHOLHDL 3.0    Hematology Recent Labs  Lab 08/25/22 1958 08/26/22 0217 08/29/22 0256  WBC 5.5 5.5 6.6  RBC 4.27 4.57 4.26  HGB 12.4 13.3 12.5  HCT 37.1 39.7 36.9  MCV 86.9 86.9 86.6  MCH 29.0 29.1 29.3  MCHC 33.4 33.5 33.9  RDW 13.0 13.1 13.2  PLT 204 180 214   Thyroid No results for input(s): "TSH", "FREET4" in the last 168 hours.  BNP Recent Labs  Lab 08/26/22 1517  BNP 1,066.0*    DDimer No  results for input(s): "DDIMER" in the last 168 hours.   Radiology    DG CHEST PORT 1 VIEW  Result Date: 08/27/2022 CLINICAL DATA:  Shortness of breath EXAM: PORTABLE CHEST 1 VIEW COMPARISON:  Chest x-ray 08/22/2022, CT 08/21/2022 FINDINGS: Right-sided central venous port tip over the SVC region allowing for patient rotation. Enlarged cardiomediastinal silhouette with possible left effusion. Central vascular congestion. Worsening airspace disease at the left greater than right lung base. Right hilar prominence likely augmented by portable technique and rotation, no obvious mass seen on the CT from earlier this month. IMPRESSION: 1. Worsening airspace disease at the left greater than right lung base, atelectasis versus pneumonia. 2. Cardiomegaly with central vascular congestion. Possible left effusion. 3. Right hilar prominence likely augmented by portable technique and rotation. Consider follow-up two-view chest when clinically feasible Electronically Signed   By: Donavan Foil M.D.   On: 08/27/2022 17:41    Cardiac Studies   Cath: 08/25/2022      1st Diag lesion is 50% stenosed.   Mid RCA  lesion is 70% stenosed.   The left ventricular systolic function is normal.   LV end diastolic pressure is normal.   The left ventricular ejection fraction is 50-55% by visual estimate.   There is no aortic valve stenosis.   Small vessel disease.  No PCI targets.  Medical therapy.    08/22/22 TTE from [facility]   Patient has normal left ventricular chamber size. Ejection fraction 40-45%. Regional wall motion abnormalities noted. Right ventricle mildly enlarged and right ventricular global systolic function moderately reduced. Mild-to-moderate tricuspid valve regurgitation. Pressures 38 mmHg.  Patient Profile     81 y.o. female with PMH of HTN, DM 2, hypothyroidism, COPD, mantle cell lymphoma, colon cancer s/p resection and primary anastomosis who presented to [facility] on 08/21/2022 with symptoms of chest pain associated with SOB and weakness. Troponin IV 0.67>> 0.40. proBNP 24,200. Echo showed LVEF 40 to 45%. Cardiology was consulted and recommended transfer to [facility] for cardiac cath.   Assessment & Plan    NSTEMI -- underwent cardiac cath noted above with small vessel disease with 50% stenosis of first diagonal and 70% stenosis of mid RCA. Does not have PCI targets and medical therapy was recommended.  -- continue ASA, statin, coreg  HFmrEF -- Echo from [facility] 40-45%, LV gram 50-55% -- she was placed on IV lasix '20mg'$  BID yesterday, net - 2.5L, weight trending down. Continue IV lasix as she remains hypoxic on O2 -- continue coreg 6.'25mg'$  BID, spiro 12.'5mg'$  daily. With softer BPs will hold on additional therapy  HTN -- stable, soft at times -- continue coreg 6.'25mg'$  BID, spiro 12.'5mg'$  daily  HLD -- on atorvastatin '40mg'$  daily, Zetia   SW/CM following with plans for SNF placement, pending bed placement   For questions or updates, please contact [place] Please consult [email or web address] for contact info under        Signed, Reino Bellis, NP  08/29/2022,  9:23 AM    I have examined the patient and reviewed assessment and plan and discussed with patient.  Agree with above as stated.    Patient not reporting shortness of breath.  She feels tired.  Oxygen saturations 95% on current supplemental oxygen.  In discussion with the family, the daughter did state that she had been diagnosed with COPD years ago, but no tests were done at the time.  She was just given a nebulizer.    She does not grossly appear volume overloaded.  LVEDP was not significantly elevated.  BNP was elevated.  There may be some component of volume overload.  Likely combined systolic/diastolic heart failure.  No significant CAD to explain decreased LVEF.  Continue medical therapy.  Plan to have her discharged to SNF as she had significant weakness and falls risk.  Hopeful for discharge later today.  She will need follow-up of her electrolytes.  Attempts at weaning oxygen would be reasonable as well.  She will follow-up with her primary care doctor to optimize medications for presumed COPD.  Larae Grooms

## 2022-08-29 NOTE — Progress Notes (Signed)
Report called in to [facility] rehab. Report given to [facility], [place]. All questions and concerns addressed.

## 2022-08-29 NOTE — TOC Progression Note (Signed)
Transition of Care [facility]) - Progression Note    Patient Details  Name: Joy Daniels MRN: [id number] Date of Birth: 1940-11-05  Transition of Care [facility]) CM/SW [place], [place] Phone Number: 08/29/2022, 10:59 AM  Clinical Narrative:     Patients insurance authorization has been approved [facility] ID# [id number]. Insurance authorization approved from 12/18-12/20. CSW informed Olivia Mackie with [facility]. Olivia Mackie with [employer] confirmed patient can dc over today if medically ready. CSW informed MD. CSW will continue to follow and assist with patients dc planning needs.  Expected Discharge Plan: [place] Barriers to Discharge: Continued Medical Work up  Expected Discharge Plan and Services Expected Discharge Plan: [place] In-house Referral: Clinical Social Work     Living arrangements for the past 2 months: Single Family Home                                       Social Determinants of Health (SDOH) Interventions    Readmission Risk Interventions     No data to display

## 2022-08-29 NOTE — TOC Transition Note (Signed)
Transition of Care [facility]) - CM/SW Discharge Note   Patient Details  Name: Joy Daniels MRN: [id number] Date of Birth: [birth year]  Transition of Care [facility]) CM/SW Contact:  Milas Gain, [place] Phone Number: 08/29/2022, 12:27 PM   Clinical Narrative:     Patient will DC to: [facility] [place]   Anticipated DC date: 08/29/2022  Family notified: [name]   Transport by: Corey Harold  ?  Per MD patient ready for DC to [facility] . RN, patient, patient's family, and facility notified of DC. Discharge Summary sent to facility. RN given number for report tele# [phone] EX: [phone] RM# [phone]. DC packet on chart. Ambulance transport requested for patient.  CSW signing off.   Final next level of care: Skilled Nursing Facility Barriers to Discharge: No Barriers Identified   Patient Goals and CMS Choice Patient states their goals for this hospitalization and ongoing recovery are:: SNF CMS Medicare.gov Compare Post Acute Care list provided to:: Patient Represenative (must comment) (patient and patients daughter) Choice offered to / list presented to : Adult Children, Patient  Discharge Placement              Patient chooses bed at: [place], [place] Patient to be transferred to facility by: [place] Name of family member notified: [name] Patient and family notified of of transfer: 08/29/22  Discharge Plan and Services In-house Referral: Clinical Social Work                                   Social Determinants of Health (SDOH) Interventions     Readmission Risk Interventions     No data to display

## 2022-08-30 DIAGNOSIS — N1831 Chronic kidney disease, stage 3a: Secondary | ICD-10-CM | POA: Diagnosis not present

## 2022-08-30 DIAGNOSIS — R262 Difficulty in walking, not elsewhere classified: Secondary | ICD-10-CM | POA: Diagnosis not present

## 2022-08-30 DIAGNOSIS — I2510 Atherosclerotic heart disease of native coronary artery without angina pectoris: Secondary | ICD-10-CM | POA: Diagnosis not present

## 2022-08-30 DIAGNOSIS — I5022 Chronic systolic (congestive) heart failure: Secondary | ICD-10-CM | POA: Diagnosis not present

## 2022-09-02 ENCOUNTER — Ambulatory Visit: Payer: Medicare HMO | Admitting: Physician Assistant

## 2022-09-19 NOTE — Progress Notes (Signed)
Cardiology Office Note:    Date:  09/20/2022   ID:  Joy Daniels, DOB May 29, 1941, MRN [id number]  PCP:  Garwin Brothers, MD   [facility] Providers Cardiologist:  None     Referring MD: Garwin Brothers, MD   Chief Complaint: hospital follow-up  History of Present Illness:    Joy Daniels is a 82 y.o. female with a hx of hypothyroidism, hypertension, COPD, DM, anemia, mantle cell lymphoma (2011 Dx), colon cancer (s/p resection and primary anastomosis), and COPD.   She presented to [facility] ED on 08/21/2022 with symptoms of constipation x 3 days and new onset abdominal pain/chest pain. She described poor oral intake over the previous several days secondary to what she felt was constipation.  On the day she presented to ED, she awoke with abdominal and chest pain as well as shortness of breath. Daughter felt that she was not being herself and took her to ED. in ED BNP 24,200, troponin (non high sensitivity) 0.67, WBC 13.3, creatinine 2.0.  Noted to be hypoxic with O2 saturation in the 80s. Repeat troponin 0.62, 0.40.  EKG without acute ischemic changes. CT chest/abd/pelvis without contrast notable for new pleural-based nodule in LLL measuring 1.8 x 1.6 cm with adjacent 0.8 x 0.7 cm subpleural nodularity. Metastatic disease not excluded.  New peripheral airspace opacity also noted in posterior upper right lobe inflammatory versus neoplastic.  Coronary atherosclerotic disease noted on CT.  Hospital notes indicated IV Lasix given but subsequently she had dramatic drop in BP and was started on IV fluids.  She had decreased LV function reportedly around 40 to 45% with some regional wall motion abnormality noted RV dysfunction and RV dilation also noted.  While initial suspicion was heart failure given elevated BNP it appeared to the hospitalist that she was dehydrated and she was continued to be managed with IV fluids.  Her physical colon exam continued to be mixed with small pleural effusions on CXR and  crackles noted on lung auscultation.  She also received empiric Rocephin 1 g x 3 doses. Creatinine proved following IV fluid administration.  She continued to have tenderness to left chest wall, creatinine returned to near baseline at 0.9 and she was initiated on Entresto. Cardiac catheterization was discussed with family and they agreed to transfer to [facility] for LHC.  Left heart catheterization 08/25/22 revealed small vessel disease with 50% stenosis of first diagonal and 70% stenosis of mid RCA with no PCI targets.  Medical therapy recommended.  She had significant volume improvement on exam at time of discharge on carvedilol 6.25 mg BID, spironolactone 12.5 mg daily.  She required O2 throughout admission.  Family reported history of COPD in the past.  Went to [facility] after hospitalization.   Today, she is here for follow-up with her daughter, Joy Daniels with whom she lives. Daughter reports patient does not do anything at home, she can barely get her to sit up in bed. Spends the majority of the day in the bed. Wants to pushed in rolling walker. When asked about symptoms, she reports chest pain in the middle of her chest and down her left side to her ribs. States symptoms resolved, now they have returned. Has not used O2 since home, it was weaned off at rehab. She denies shortness of breath, lower extremity edema, presyncope, syncope, orthopnea, and PND. Patient and daughter are disagreeing about patient's willingness to be more active. She does not appear to have dementia. Did not give specific reason for not  wanting to be more active.   Past Medical History:  Diagnosis Date   Combined hyperlipidemia    COPD (chronic obstructive pulmonary disease) (HCC)    Dysgeusia    GERD (gastroesophageal reflux disease)    Hypercalcemia    Hyperparathyroidism , secondary, non-renal (HCC)    Hypertension    Hypothyroidism, acquired, autoimmune    Iron deficiency    Lymphoma, mantle cell, multiple sites ([place])     Nontoxic multinodular goiter    Obesity (BMI 30.0-34.9)    Other osteoporosis    Pallor    Thyroiditis, autoimmune    Toxic nodular goiter    Vitamin D deficiency disease     Past Surgical History:  Procedure Laterality Date   HAND SURGERY     LEFT HEART CATH AND CORONARY ANGIOGRAPHY N/A 08/25/2022   Procedure: LEFT HEART CATH AND CORONARY ANGIOGRAPHY;  Surgeon: Jettie Booze, MD;  Location: [place] [facility];  Service: Cardiovascular;  Laterality: N/A;    Current Medications: Current Meds  Medication Sig   aspirin 81 MG chewable tablet Chew 81 mg by mouth every evening.   calcitRIOL (ROCALTROL) 0.25 MCG capsule Take by mouth.   carvedilol (COREG) 6.25 MG tablet Take 1 tablet (6.25 mg total) by mouth 2 (two) times daily with a meal.   ezetimibe (ZETIA) 10 MG tablet Take 10 mg by mouth every evening.   pravastatin (PRAVACHOL) 40 MG tablet Take 40 mg by mouth every evening.   spironolactone (ALDACTONE) 25 MG tablet Take 0.5 tablets (12.5 mg total) by mouth daily.     Allergies:   Codeine and Latex   Social History   Socioeconomic History   Marital status: Married    Spouse name: Not on file   Number of children: Not on file   Years of education: Not on file   Highest education level: Not on file  Occupational History   Not on file  Tobacco Use   Smoking status: Never    Passive exposure: Yes   Smokeless tobacco: Never  Substance and Sexual Activity   Alcohol use: No   Drug use: No   Sexual activity: Not on file  Other Topics Concern   Not on file  Social History Narrative   Not on file   Social Determinants of Health   Financial Resource Strain: Not on file  Food Insecurity: No Food Insecurity (06/18/2021)   Hunger Vital Sign    Worried About Running Out of Food in the Last Year: Never true    Ran Out of Food in the Last Year: Never true  Transportation Needs: No Transportation Needs (06/18/2021)   PRAPARE - Hydrologist  (Medical): No    Lack of Transportation (Non-Medical): No  Physical Activity: Not on file  Stress: Not on file  Social Connections: Not on file     Family History: The patient's family history includes Cancer in her sister; Diabetes in her father; Heart disease in her sister.  ROS:   Please see the history of present illness.   + chest pain All other systems reviewed and are negative.  Labs/Other Studies Reviewed:    The following studies were reviewed today:    1st Diag lesion is 50% stenosed.   Mid RCA lesion is 70% stenosed.   The left ventricular systolic function is normal.   LV end diastolic pressure is normal.   The left ventricular ejection fraction is 50-55% by visual estimate.   There is no  aortic valve stenosis.   Small vessel disease.  No PCI targets.  Medical therapy.   Diagnostic Dominance: Left    08/22/22 TTE from [facility]   Patient has normal left ventricular chamber size. Ejection fraction 40-45%. Regional wall motion abnormalities noted. Right ventricle mildly enlarged and right ventricular global systolic function moderately reduced. Mild-to-moderate tricuspid valve regurgitation. Pressures 38 mmHg.  Recent Labs: 06/14/2022: TSH 3.950 08/26/2022: B Natriuretic Peptide 1,066.0 08/27/2022: BUN 15; Creatinine, Ser 1.16; Magnesium 2.2; Potassium 4.6; Sodium 136 08/29/2022: Hemoglobin 12.5; Platelets 214  Recent Lipid Panel    Component Value Date/Time   CHOL 88 08/27/2022 0148   TRIG 69 08/27/2022 0148   HDL 29 (L) 08/27/2022 0148   CHOLHDL 3.0 08/27/2022 0148   VLDL 14 08/27/2022 0148   LDLCALC 45 08/27/2022 0148     Risk Assessment/Calculations:       Physical Exam:    VS:  BP 120/76   Pulse 84   Ht '5\' 1"'$  (1.549 m)   Wt 133 lb (60.3 kg)   SpO2 93%   BMI 25.13 kg/m     Wt Readings from Last 3 Encounters:  09/20/22 133 lb (60.3 kg)  08/29/22 137 lb 12.6 oz (62.5 kg)  02/21/22 136 lb 8 oz (61.9 kg)     GEN: Well nourished, well  developed in no acute distress HEENT: Normal NECK: No JVD; No carotid bruits CARDIAC: RRR, no murmurs, rubs, gallops RESPIRATORY:  Clear to auscultation without rales, wheezing or rhonchi  ABDOMEN: Soft, non-tender, non-distended MUSCULOSKELETAL:  No edema; No deformity. 2+ pedal pulses, equal bilaterally SKIN: Warm and dry NEUROLOGIC:  Alert and oriented x 3 PSYCHIATRIC:  Normal affect   EKG:  EKG is not ordered today.     Diagnoses:    1. Essential hypertension, benign   2. Coronary artery disease involving native coronary artery of native heart without angina pectoris   3. Physical deconditioning   4. Hyperlipidemia LDL goal <70   5. NSTEMI (non-ST elevated myocardial infarction) ([place])   6. Aneurysm of descending thoracic aorta without rupture (HCC)   7. Chronic heart failure with preserved ejection fraction (HFpEF) (HCC)    Assessment and Plan:     CAD s/p NSTEMI: LHC 08/25/22 with 50% stenosis 1st diagonal, mRCA lesion 70% stenosis, no PCI targets, recommendation for medical therapy. She has some left-sided chest pain that is intermittent, not worsened with exertion. No associated shortness of breath, n/v, or diaphoresis. Symptoms seem to be more musculoskeletal. Is not very active. Encouragement given to increase activity and report worsening symptoms if they occur.   HFmrEF: Echo with LVEF 40 to 45% 08/22/22, LV gram 12/14 50-55%. Appears euvolemic on exam today. Denies dyspnea, orthopnea, PND, edema. Spends the majority of her time in bed. Encouraged increased activity. She is tolerating current GDMT low-dose carvedilol and spironolactone.   Ascending aortic dilatation: Stable dilatation of the ascending aorta measuring 4.1 cm on CT 02/20/22.   Hypertension: BP is well controlled. No medication changes today. Will check BMP today.   Hyperlipidemia LDL goal < 70: LDL 45 on 08/27/22. Intolerant of statins. Continue ezetimibe.   Physical deconditioning: Daughter is frustrated  by patient's unwillingness to be more active. Encouragement given to increase activity and resume activities that she previously enjoyed.     Disposition: 6 months in [place]  Medication Adjustments/Labs and Tests Ordered: Current medicines are reviewed at length with the patient today.  Concerns regarding medicines are outlined above.  Orders Placed This Encounter  Procedures  Basic Metabolic Panel (BMET)   No orders of the defined types were placed in this encounter.   Patient Instructions  Medication Instructions:   Your physician recommends that you continue on your current medications as directed. Please refer to the Current Medication list given to you today.   *If you need a refill on your cardiac medications before your next appointment, please call your pharmacy*   Lab Work:  TODAY!!!! BMET  If you have labs (blood work) drawn today and your tests are completely normal, you will receive your results only by: [place] (if you have MyChart) OR A paper copy in the mail If you have any lab test that is abnormal or we need to change your treatment, we will call you to review the results.   Testing/Procedures:   None ordered.   Follow-Up: At [facility], you and your health needs are our priority.  As part of our continuing mission to provide you with exceptional heart care, we have created designated Provider Care Teams.  These Care Teams include your primary Cardiologist (physician) and Advanced Practice Providers (APPs -  Physician Assistants and Nurse Practitioners) who all work together to provide you with the care you need, when you need it.  We recommend signing up for the patient portal called "MyChart".  Sign up information is provided on this After Visit Summary.  MyChart is used to connect with patients for Virtual Visits (Telemedicine).  Patients are able to view lab/test results, encounter notes, upcoming appointments, etc.  Non-urgent  messages can be sent to your provider as well.   To learn more about what you can do with MyChart, go to [email or web address].    Your next appointment:   5 month(s)  The format for your next appointment:   In Person  Provider:   Venia Carbon, NP     Important Information About Sugar         Signed, Emmaline Life, NP  09/20/2022 1:34 PM    [place]

## 2022-09-20 ENCOUNTER — Ambulatory Visit: Payer: Medicare HMO | Attending: Physician Assistant | Admitting: Nurse Practitioner

## 2022-09-20 ENCOUNTER — Encounter: Payer: Self-pay | Admitting: Nurse Practitioner

## 2022-09-20 VITALS — BP 120/76 | HR 84 | Ht 61.0 in | Wt 133.0 lb

## 2022-09-20 DIAGNOSIS — I2510 Atherosclerotic heart disease of native coronary artery without angina pectoris: Secondary | ICD-10-CM | POA: Diagnosis not present

## 2022-09-20 DIAGNOSIS — R5381 Other malaise: Secondary | ICD-10-CM

## 2022-09-20 DIAGNOSIS — E119 Type 2 diabetes mellitus without complications: Secondary | ICD-10-CM | POA: Diagnosis not present

## 2022-09-20 DIAGNOSIS — I10 Essential (primary) hypertension: Secondary | ICD-10-CM | POA: Diagnosis not present

## 2022-09-20 DIAGNOSIS — I7123 Aneurysm of the descending thoracic aorta, without rupture: Secondary | ICD-10-CM

## 2022-09-20 DIAGNOSIS — I214 Non-ST elevation (NSTEMI) myocardial infarction: Secondary | ICD-10-CM

## 2022-09-20 DIAGNOSIS — E785 Hyperlipidemia, unspecified: Secondary | ICD-10-CM

## 2022-09-20 DIAGNOSIS — I5032 Chronic diastolic (congestive) heart failure: Secondary | ICD-10-CM | POA: Diagnosis not present

## 2022-09-20 DIAGNOSIS — J449 Chronic obstructive pulmonary disease, unspecified: Secondary | ICD-10-CM | POA: Diagnosis not present

## 2022-09-20 NOTE — Patient Instructions (Signed)
Medication Instructions:   Your physician recommends that you continue on your current medications as directed. Please refer to the Current Medication list given to you today.   *If you need a refill on your cardiac medications before your next appointment, please call your pharmacy*   Lab Work:  TODAY!!!! BMET  If you have labs (blood work) drawn today and your tests are completely normal, you will receive your results only by: [place] (if you have MyChart) OR A paper copy in the mail If you have any lab test that is abnormal or we need to change your treatment, we will call you to review the results.   Testing/Procedures:   None ordered.   Follow-Up: At [facility], you and your health needs are our priority.  As part of our continuing mission to provide you with exceptional heart care, we have created designated Provider Care Teams.  These Care Teams include your primary Cardiologist (physician) and Advanced Practice Providers (APPs -  Physician Assistants and Nurse Practitioners) who all work together to provide you with the care you need, when you need it.  We recommend signing up for the patient portal called "MyChart".  Sign up information is provided on this After Visit Summary.  MyChart is used to connect with patients for Virtual Visits (Telemedicine).  Patients are able to view lab/test results, encounter notes, upcoming appointments, etc.  Non-urgent messages can be sent to your provider as well.   To learn more about what you can do with MyChart, go to [email or web address].    Your next appointment:   5 month(s)  The format for your next appointment:   In Person  Provider:   Venia Carbon, NP     Important Information About [place]

## 2022-09-21 LAB — BASIC METABOLIC PANEL
BUN/Creatinine Ratio: 18 (ref 12–28)
BUN: 22 mg/dL (ref 8–27)
CO2: 23 mmol/L (ref 20–29)
Calcium: 10.9 mg/dL — ABNORMAL HIGH (ref 8.7–10.3)
Chloride: 102 mmol/L (ref 96–106)
Creatinine, Ser: 1.22 mg/dL — ABNORMAL HIGH (ref 0.57–1.00)
Glucose: 112 mg/dL — ABNORMAL HIGH (ref 70–99)
Potassium: 4.5 mmol/L (ref 3.5–5.2)
Sodium: 140 mmol/L (ref 134–144)
eGFR: 44 mL/min/{1.73_m2} — ABNORMAL LOW (ref >59)

## 2022-10-03 ENCOUNTER — Other Ambulatory Visit: Payer: Self-pay

## 2022-10-10 ENCOUNTER — Inpatient Hospital Stay: Payer: Medicare HMO | Admitting: Internal Medicine

## 2022-10-17 ENCOUNTER — Inpatient Hospital Stay: Payer: Medicare HMO | Admitting: Internal Medicine

## 2022-10-19 ENCOUNTER — Encounter: Payer: Self-pay | Admitting: Internal Medicine

## 2022-10-19 ENCOUNTER — Ambulatory Visit: Payer: Medicare HMO | Admitting: Internal Medicine

## 2022-10-19 VITALS — BP 126/82 | HR 67 | Temp 97.3°F | Resp 18 | Ht 61.0 in | Wt 133.4 lb

## 2022-10-19 DIAGNOSIS — R911 Solitary pulmonary nodule: Secondary | ICD-10-CM

## 2022-10-19 DIAGNOSIS — I2510 Atherosclerotic heart disease of native coronary artery without angina pectoris: Secondary | ICD-10-CM | POA: Insufficient documentation

## 2022-10-19 HISTORY — DX: Atherosclerotic heart disease of native coronary artery without angina pectoris: I25.10

## 2022-10-19 HISTORY — DX: Solitary pulmonary nodule: R91.1

## 2022-10-19 MED ORDER — CARVEDILOL 6.25 MG PO TABS: 6.2500 mg | ORAL_TABLET | Freq: Two times a day (BID) | ORAL | 3 refills | Status: DC

## 2022-10-19 MED ORDER — CARVEDILOL 6.25 MG PO TABS: 6.2500 mg | ORAL_TABLET | Freq: Two times a day (BID) | ORAL | 0 refills | Status: DC

## 2022-10-19 MED ORDER — CALCITRIOL 0.25 MCG PO CAPS: 0.2500 ug | ORAL_CAPSULE | Freq: Every day | ORAL | 3 refills | Status: DC

## 2022-10-19 MED ORDER — PRAVASTATIN SODIUM 40 MG PO TABS: 40.0000 mg | ORAL_TABLET | Freq: Every evening | ORAL | 3 refills | Status: DC

## 2022-10-19 MED ORDER — SPIRONOLACTONE 25 MG PO TABS: 12.5000 mg | ORAL_TABLET | Freq: Every day | ORAL | 0 refills | Status: DC

## 2022-10-19 MED ORDER — EZETIMIBE 10 MG PO TABS: 10.0000 mg | ORAL_TABLET | Freq: Every evening | ORAL | 3 refills | Status: DC

## 2022-10-19 MED ORDER — CALCITRIOL 0.25 MCG PO CAPS: 0.2500 ug | ORAL_CAPSULE | Freq: Every day | ORAL | 0 refills | Status: DC

## 2022-10-19 MED ORDER — SPIRONOLACTONE 25 MG PO TABS: 12.5000 mg | ORAL_TABLET | Freq: Every day | ORAL | 3 refills | Status: DC

## 2022-10-19 NOTE — Progress Notes (Addendum)
Office Visit  Subjective   Patient ID: Joy Daniels   DOB: [birth year]   Age: 82 y.o.   MRN: [id number]   Chief Complaint Chief Complaint  Patient presents with   Follow-up    ED/NH  follow up     History of Present Illness Joy Daniels is a 82 yo female who comes in today for a hospital followup.  She presented to [facility] on 08/21/2022 with chest pain and SOB.  They felt she has a NSTEMI with elevated troponins but no NSTEMI on EKG.  Cardiology was consulted and she was sent to [facility] for evaluation.  While at [facility], she did have a CT of her chest/abdomen/pelvis that showed a new pleural base nodule in her left lower lung but no findings of active malignancy in her abd/pelvis.  There was stable peripheral scarring in the right upper lobe and right lower lobe with aortic atherosclerosis.  She had an ECHO done on 08/22/2022 at [facility] which a LVEF 40-45% without regional wall motion abnormalities with her right ventricle mildly enlarged and right ventricular global systolic function moderately reduced with mild to moderate TR.  She was transferred to [facility] where she underwent a left heart catherization on 08/25/2022 that showed a st Diag lesion 50% stenosed, mid RCA lesion 70% stenosed her LV systolic function was normal with LVEF of 50-55%.  She was noted to have small vessel disease with no PCI targets and cardiology recommended medical therapy.  She was discharged from [facility] on 08/30/2023 where she went to [facility].   The patient stayed at [facility] for 3 weeks.  The family does not want homehealth therapy as she does not want to participate.  She did see cardiology as an outpatient on 09/20/2022 where she was felt to have CAD s/p NSTEMI.  She has some left-sided chest pain that is intermittent, not worsened with exertion. There was no associated shortness of breath, n/v, or diaphoresis. Cardiology felt her symptoms seemed to be more musculoskeletal. Encouragement given to increase activity and report  worsening symptoms if they occur. They also discussed her HFmrEF: Echo with LVEF 40 to 45% 08/22/22, LV gram 12/14 50-55%. She appeared to be euvolemic at that time without dyspnea, orthopnea, PND, edema. Spends the majority of her time in bed. Encouraged increased activity. She is tolerating current GDMT low-dose carvedilol and spironolactone. Her daughter states she is getting up more on her own and is doing more.     Past Medical History Past Medical History:  Diagnosis Date   Combined hyperlipidemia    COPD (chronic obstructive pulmonary disease) (HCC)    Dysgeusia    GERD (gastroesophageal reflux disease)    History of colon cancer    History of lymphoma    Hypercalcemia    Hyperparathyroidism , secondary, non-renal (HCC)    Hypertension    Hypothyroidism, acquired, autoimmune    Iron deficiency    Lymphoma, mantle cell, multiple sites ([place])    Nontoxic multinodular goiter    Obesity (BMI 30.0-34.9)    Other osteoporosis    Pallor    Thyroiditis, autoimmune    Toxic nodular goiter    Vitamin D deficiency disease      Allergies Allergies  Allergen Reactions   Codeine Other (See Comments)    unk   Latex Rash     Medications  Current Outpatient Medications:    aspirin 81 MG chewable tablet, Chew 81 mg by mouth every evening., Disp: , Rfl:  calcitRIOL (ROCALTROL) 0.25 MCG capsule, Take 1 capsule (0.25 mcg total) by mouth daily. Morning, Disp: 30 capsule, Rfl: 0   carvedilol (COREG) 6.25 MG tablet, Take 1 tablet (6.25 mg total) by mouth 2 (two) times daily with a meal., Disp: 60 tablet, Rfl: 0   ezetimibe (ZETIA) 10 MG tablet, Take 1 tablet (10 mg total) by mouth every evening., Disp: 90 tablet, Rfl: 3   pravastatin (PRAVACHOL) 40 MG tablet, Take 1 tablet (40 mg total) by mouth every evening., Disp: 90 tablet, Rfl: 3   spironolactone (ALDACTONE) 25 MG tablet, Take 0.5 tablets (12.5 mg total) by mouth daily., Disp: 15 tablet, Rfl: 0   Review of Systems Review of  Systems  Constitutional:  Negative for chills and fever.  Eyes:  Negative for blurred vision.  Respiratory:  Negative for cough and shortness of breath.   Cardiovascular:  Negative for chest pain, palpitations and leg swelling.  Gastrointestinal:  Positive for constipation. Negative for abdominal pain and diarrhea.  Musculoskeletal:  Negative for myalgias.  Neurological:  Positive for dizziness. Negative for weakness and headaches.       Objective:    Vitals BP 126/82 (BP Location: Left Arm, Patient Position: Sitting, Cuff Size: Normal)   Pulse 67   Temp (!) 97.3 F (36.3 C) (Temporal)   Resp 18   Ht '5\' 1"'$  (1.549 m)   Wt 133 lb 6.4 oz (60.5 kg)   SpO2 98%   BMI 25.21 kg/m    Physical Examination Physical Exam Constitutional:      Appearance: Normal appearance. She is not ill-appearing.  Cardiovascular:     Rate and Rhythm: Normal rate and regular rhythm.     Pulses: Normal pulses.     Heart sounds: No murmur heard.    No friction rub. No gallop.  Pulmonary:     Effort: Pulmonary effort is normal. No respiratory distress.     Breath sounds: No wheezing, rhonchi or rales.  Abdominal:     General: Bowel sounds are normal. There is no distension.     Palpations: Abdomen is soft.     Tenderness: There is no abdominal tenderness.  Musculoskeletal:     Right lower leg: No edema.     Left lower leg: No edema.  Skin:    General: Skin is warm and dry.     Findings: No rash.  Neurological:     Mental Status: She is alert.        Assessment & Plan:   Coronary artery disease involving native coronary artery of native heart without angina pectoris She has CAD with a NSTEMI in 08/2022.  She has seen cardiology and we will continue risk factor modification with medical management.  She is on an ASA, BB, and statin.  Her meds were refilled.  Lung nodule I did review her notes from her past visit with Dr. Bobby Rumpf and  I did contact him on the phone in regards to this new  pulmonary nodules.  She had a CT scan of her chest in 01/2022 and had a PET scan prior to that.  Her CT scan showed no recurrent right pleural effusion or increasing right lung nodularity. The previously demonstrated pulmonary nodules are stable to improved from the most recent PET-CT of 6 months ago.  There was was no new enlarging pulmonary nodules or thoracic adenopathy.  However, this CT scan of her chest/abd/pelvis done with this recent hospitalization in 08/2022 shows this new pulmonary nodule.  Dr. Bobby Rumpf and I  recommend that if the patient wants a workup, to refer her to pulmonary to set her up to have a biopsy.    Return in about 3 months (around 01/17/2023).   Townsend Roger, MD

## 2022-10-19 NOTE — Assessment & Plan Note (Signed)
She has CAD with a NSTEMI in 08/2022.  She has seen cardiology and we will continue risk factor modification with medical management.  She is on an ASA, BB, and statin.  Her meds were refilled.

## 2022-10-19 NOTE — Addendum Note (Signed)
Addended by: Townsend Roger on: 10/19/2022 05:14 PM   Modules accepted: Orders

## 2022-10-19 NOTE — Assessment & Plan Note (Signed)
I did review her notes from her past visit with Dr. Bobby Rumpf and  I did contact him on the phone in regards to this new pulmonary nodules.  She had a CT scan of her chest in 01/2022 and had a PET scan prior to that.  Her CT scan showed no recurrent right pleural effusion or increasing right lung nodularity. The previously demonstrated pulmonary nodules are stable to improved from the most recent PET-CT of 6 months ago.  There was was no new enlarging pulmonary nodules or thoracic adenopathy.  However, this CT scan of her chest/abd/pelvis done with this recent hospitalization in 08/2022 shows this new pulmonary nodule.  Dr. Bobby Rumpf and I recommend that if the patient wants a workup, to refer her to pulmonary to set her up to have a biopsy.

## 2022-10-29 ENCOUNTER — Other Ambulatory Visit: Payer: Self-pay | Admitting: Internal Medicine

## 2022-11-10 ENCOUNTER — Other Ambulatory Visit: Payer: Self-pay | Admitting: Internal Medicine

## 2022-11-12 ENCOUNTER — Other Ambulatory Visit: Payer: Self-pay | Admitting: Internal Medicine

## 2022-11-15 ENCOUNTER — Other Ambulatory Visit: Payer: Self-pay

## 2022-11-15 DIAGNOSIS — R918 Other nonspecific abnormal finding of lung field: Secondary | ICD-10-CM | POA: Diagnosis not present

## 2022-11-15 MED ORDER — SPIRONOLACTONE 25 MG PO TABS: 12.5000 mg | ORAL_TABLET | Freq: Every day | ORAL | 1 refills | Status: DC

## 2022-11-21 DIAGNOSIS — R918 Other nonspecific abnormal finding of lung field: Secondary | ICD-10-CM | POA: Diagnosis not present

## 2022-11-28 DIAGNOSIS — R918 Other nonspecific abnormal finding of lung field: Secondary | ICD-10-CM | POA: Diagnosis not present

## 2023-01-06 ENCOUNTER — Telehealth: Payer: Self-pay

## 2023-01-06 NOTE — Progress Notes (Signed)
CMCS Notes  Scheduling: Sherlene Shams Conservation officer, historic buildings) spoke with pt or pt representative on 12/06/22. They scheduled pt's initial CMCS Televisit on 01/13/23.  Start: 01/06/23, 10:35 AM. Reviewed charts/consult notes/labs/vitals/medications/documents/TEs to complete chart prep for pt's initial CMCS Televisit. Used [employer] and [employer]. Pt is scheduled to speak with Lynann Bologna, PharmD on 01/13/23. Total time: 43 minutes (non-billable time). Anne Ng, LPN.  No-Show: Televisit on 01/13/23.  01/24/23: Spoke with Antionette Char (daughter, on HIPAA form). Made her aware of the missed appt. We rescheduled the Televisit to 02/24/23 at 1:30pm.  Junious Dresser stated, "This [phone]) is my cell phone number. I don't give out the home phone number, because [pt] might answer it. She answers it, but can't tell me that [someone] called. She [might say that] somebody called, but doesn't remember [the conversation]. It's not that she doesn't understand [what the caller is saying]. My brother and I both are her POA, but I do everything; he's hardly ever with her."  Per Junious Dresser, we may leave detailed VM messages that contain medical information. If we need to reach them urgently, we may also call Cher Nakai (pt's son) at [phone]. - Anne Ng, LPN.

## 2023-01-16 ENCOUNTER — Ambulatory Visit: Payer: Medicare HMO | Admitting: Internal Medicine

## 2023-02-22 NOTE — Progress Notes (Signed)
[facility]  [address] [place],  [place]  [address] [phone]  Clinic Day:  02/23/2023  Referring physician: Eloisa Northern, MD   HISTORY OF PRESENT ILLNESS:  The patient is an 82 y.o. female with stage IIA (T3 N0 M0) colon cancer, status post a right hemicolectomy in December 2014.  As she was cancer free for 5 years, she has been considered cured of this disease.  The patient also has a history of stage IVA mantle cell lymphoma, for which she underwent 6 cycles of bendamustine/Rituxan, followed by 2 years of maintenance Rituxan, which were completed in March 2014.  She comes in today for routine follow-up. Since her last visit, the patient has been doing fairly well.  She denies having any B symptoms or lymphadenopathy that concerns her for recurrent mantle cell lymphoma.  Her family is concerned as she does not ambulate much at all, primarily due to her fears of falling, which has not happened in multiple years.  PHYSICAL EXAM:  Blood pressure (!) 183/70, pulse 64, temperature 97.7 F (36.5 C), resp. rate 14, height 5\' 1"  (1.549 m), weight 137 lb 4.8 oz (62.3 kg), SpO2 96 %. Wt Readings from Last 3 Encounters:  02/23/23 137 lb 4.8 oz (62.3 kg)  10/19/22 133 lb 6.4 oz (60.5 kg)  09/20/22 133 lb (60.3 kg)   Body mass index is 25.94 kg/m. Performance status (ECOG): 1 - Symptomatic but completely ambulatory Physical Exam Constitutional:      Appearance: Normal appearance. She is not ill-appearing.  HENT:     Mouth/Throat:     Mouth: Mucous membranes are moist.     Pharynx: Oropharynx is clear. No oropharyngeal exudate or posterior oropharyngeal erythema.  Cardiovascular:     Rate and Rhythm: Normal rate and regular rhythm.     Heart sounds: No murmur heard.    No friction rub. No gallop.  Pulmonary:     Effort: Pulmonary effort is normal. No respiratory distress.     Breath sounds: Normal breath sounds. No decreased breath sounds, wheezing,  rhonchi or rales.  Abdominal:     General: Bowel sounds are normal. There is no distension.     Palpations: Abdomen is soft. There is no mass.     Tenderness: There is no abdominal tenderness.  Musculoskeletal:        General: No swelling.     Right lower leg: No edema.     Left lower leg: No edema.  Lymphadenopathy:     Cervical: No cervical adenopathy.     Upper Body:     Right upper body: No supraclavicular or axillary adenopathy.     Left upper body: No supraclavicular or axillary adenopathy.     Lower Body: No right inguinal adenopathy. No left inguinal adenopathy.  Skin:    General: Skin is warm.     Coloration: Skin is not jaundiced.     Findings: No lesion or rash.  Neurological:     General: No focal deficit present.     Mental Status: She is alert and oriented to person, place, and time. Mental status is at baseline.  Psychiatric:        Mood and Affect: Mood normal.        Behavior: Behavior normal.        Thought Content: Thought content normal.    LABS:    Latest Reference Range & Units 02/23/23 10:14  Sodium 135 - 145 mmol/L 140  Potassium 3.5 -  5.1 mmol/L 4.2  Chloride 98 - 111 mmol/L 107  CO2 22 - 32 mmol/L 26  Glucose 70 - 99 mg/dL 93  BUN 8 - 23 mg/dL 26 (H)  Creatinine 8.29 - 1.00 mg/dL 5.62 (H)  Calcium 8.9 - 10.3 mg/dL 13.0 (H)  Anion gap 5 - 15  7  Alkaline Phosphatase 38 - 126 U/L 49  Albumin 3.5 - 5.0 g/dL 4.1  AST 15 - 41 U/L 20  ALT 0 - 44 U/L 18  Total Protein 6.5 - 8.1 g/dL 7.7  Total Bilirubin 0.3 - 1.2 mg/dL 1.8 (H)  GFR, Est Non African American >60 mL/min 43 (L)  LDH 98 - 192 U/L 125  (H): Data is abnormally high (L): Data is abnormally low  ASSESSMENT & PLAN:  Assessment/Plan:  An 82 y.o. female with a remote history of both mantle cell lymphoma and colon cancer.  As mentioned previously, this patient has already been considered cured of her colon cancer.  As it pertains to her mantle cell lymphoma, based upon her labs and physical  exam today, the patient remains disease-free.  Most of this visit was spent today explaining to the patient that there is nothing which prevents her from ambulating.  My concern is that there may be some dementia, in combination with depression, that is preventing her from ambulating like she theoretically should.  There is nothing per my physical exam which suggests that she is physically unable to do this.  I made sure she and her family understood this.  As she is doing well from a mantle cell lymphoma perspective, I will see her back in 1 year for repeat clinical assessment.  The patient understands all the plans discussed today and is in agreement with them.  Kameisha Malicki Kirby Funk, MD

## 2023-02-23 ENCOUNTER — Inpatient Hospital Stay: Payer: Medicare HMO | Attending: Oncology | Admitting: Oncology

## 2023-02-23 ENCOUNTER — Inpatient Hospital Stay: Payer: Medicare HMO

## 2023-02-23 ENCOUNTER — Other Ambulatory Visit: Payer: Self-pay | Admitting: Oncology

## 2023-02-23 VITALS — BP 183/70 | HR 64 | Temp 97.7°F | Resp 14 | Ht 61.0 in | Wt 137.3 lb

## 2023-02-23 DIAGNOSIS — C8319 Mantle cell lymphoma, extranodal and solid organ sites: Secondary | ICD-10-CM | POA: Diagnosis not present

## 2023-02-23 DIAGNOSIS — C8318 Mantle cell lymphoma, lymph nodes of multiple sites: Secondary | ICD-10-CM

## 2023-02-23 DIAGNOSIS — D649 Anemia, unspecified: Secondary | ICD-10-CM | POA: Diagnosis not present

## 2023-02-23 DIAGNOSIS — Z85038 Personal history of other malignant neoplasm of large intestine: Secondary | ICD-10-CM | POA: Insufficient documentation

## 2023-02-23 DIAGNOSIS — Z8572 Personal history of non-Hodgkin lymphomas: Secondary | ICD-10-CM | POA: Insufficient documentation

## 2023-02-23 LAB — CMP (CANCER CENTER ONLY)
ALT: 18 U/L (ref 0–44)
AST: 20 U/L (ref 15–41)
Albumin: 4.1 g/dL (ref 3.5–5.0)
Alkaline Phosphatase: 49 U/L (ref 38–126)
Anion gap: 7 (ref 5–15)
BUN: 26 mg/dL — ABNORMAL HIGH (ref 8–23)
CO2: 26 mmol/L (ref 22–32)
Calcium: 10.7 mg/dL — ABNORMAL HIGH (ref 8.9–10.3)
Chloride: 107 mmol/L (ref 98–111)
Creatinine: 1.25 mg/dL — ABNORMAL HIGH (ref 0.44–1.00)
GFR, Estimated: 43 mL/min — ABNORMAL LOW (ref >60)
Glucose, Bld: 93 mg/dL (ref 70–99)
Potassium: 4.2 mmol/L (ref 3.5–5.1)
Sodium: 140 mmol/L (ref 135–145)
Total Bilirubin: 1.8 mg/dL — ABNORMAL HIGH (ref 0.3–1.2)
Total Protein: 7.7 g/dL (ref 6.5–8.1)

## 2023-02-23 LAB — CBC AND DIFFERENTIAL
HCT: 39 (ref 36–46)
Hemoglobin: 12.9 (ref 12.0–16.0)
Neutrophils Absolute: 2.88
Platelets: 184 10*3/uL (ref 150–400)
WBC: 6

## 2023-02-23 LAB — CBC: RBC: 4.19 (ref 3.87–5.11)

## 2023-02-23 LAB — LACTATE DEHYDROGENASE: LDH: 125 U/L (ref 98–192)

## 2023-02-24 ENCOUNTER — Telehealth: Payer: Self-pay

## 2023-02-24 NOTE — Telephone Encounter (Signed)
Dr Melvyn Neth:  Let pt's daughter know her hgb was normal at 12.9; she had a nml LDH of 125...Marland KitchenMarland KitchenMarland Kitchenno signs of lymphoma being back

## 2023-03-10 ENCOUNTER — Encounter: Payer: Self-pay | Admitting: Cardiology

## 2023-03-10 ENCOUNTER — Ambulatory Visit: Payer: Medicare HMO | Attending: Cardiology | Admitting: Cardiology

## 2023-03-10 VITALS — BP 120/60 | HR 70 | Ht 61.0 in | Wt 137.6 lb

## 2023-03-10 DIAGNOSIS — I10 Essential (primary) hypertension: Secondary | ICD-10-CM

## 2023-03-10 DIAGNOSIS — I2510 Atherosclerotic heart disease of native coronary artery without angina pectoris: Secondary | ICD-10-CM | POA: Diagnosis not present

## 2023-03-10 NOTE — Patient Instructions (Signed)
Medication Instructions:  Your physician recommends that you continue on your current medications as directed. Please refer to the Current Medication list given to you today.  *If you need a refill on your cardiac medications before your next appointment, please call your pharmacy*   Lab Work: NONE If you have labs (blood work) drawn today and your tests are completely normal, you will receive your results only by: MyChart Message (if you have MyChart) OR A paper copy in the mail If you have any lab test that is abnormal or we need to change your treatment, we will call you to review the results.   Testing/Procedures: NONE   Follow-Up: At [facility], you and your health needs are our priority.  As part of our continuing mission to provide you with exceptional heart care, we have created designated Provider Care Teams.  These Care Teams include your primary Cardiologist (physician) and Advanced Practice Providers (APPs -  Physician Assistants and Nurse Practitioners) who all work together to provide you with the care you need, when you need it.  We recommend signing up for the patient portal called "MyChart".  Sign up information is provided on this After Visit Summary.  MyChart is used to connect with patients for Virtual Visits (Telemedicine).  Patients are able to view lab/test results, encounter notes, upcoming appointments, etc.  Non-urgent messages can be sent to your provider as well.   To learn more about what you can do with MyChart, go to [email or web address].    Your next appointment:   6 month(s)  Provider:   Wallis Bamberg, NP Rosalita Levan)    Other Instructions

## 2023-03-10 NOTE — Progress Notes (Signed)
Cardiology Office Note:  .   Date:  03/10/2023  ID:  Joy Daniels, DOB 1941/08/13, MRN [id number] PCP: Crist Fat, MD  [facility] Providers Cardiologist:  None    History of Present Illness: .   Joy Daniels is a 82 y.o. female with past medical history of nonobstructive CAD per LHC in 2023, HFmrEF, hypothyroidism, hypertension, COPD, mantle cell lymphoma 2011, colon cancer s/p resection, anemia, DM2.  She presented to [facility] ED on 08/21/2022 with symptoms of constipation for 3 days and new abdominal pain/chest pain.  proBNP 24,200, troponin 0.67, WBCs 13, creatinine 2.0, she was noted to be hypoxic with saturations in the 80s.  Chest CT notable for new pleural-based nodule LLL.  EF at this time was 40 to 45%, positive RWMA.  She was eventually transferred to [facility] and underwent left heart catheterization which revealed small vessel disease with 50% stenosis of first diagonal and 70% stenosis of mid RCA, no PCI targets and medical therapy was recommended.  She was eventually discharged to [facility] for rehab.  She presents today accompanied by her daughter for follow-up of her CAD.  She states she has been doing well and she offers no complaints.  Her daughter has been involved with taking her back and forth to many doctors appointments and this has been hard for the patient, they would prefer a streamlined approach to her medical therapy moving forward. She denies chest pain, palpitations, dyspnea, pnd, orthopnea, n, v, dizziness, syncope, edema, weight gain, or early satiety.   ROS: negative  Studies Reviewed: .        Cardiac Studies & Procedures   CARDIAC CATHETERIZATION  CARDIAC CATHETERIZATION 08/25/2022  Narrative   1st Diag lesion is 50% stenosed.   Mid RCA lesion is 70% stenosed.   The left ventricular systolic function is normal.   LV end diastolic pressure is normal.   The left ventricular ejection fraction is 50-55% by visual estimate.   There  is no aortic valve stenosis.  Small vessel disease.  No PCI targets.  Medical therapy.  Findings Coronary Findings Diagnostic  Dominance: Left  Left Anterior Descending  First Diagonal Branch 1st Diag lesion is 50% stenosed.  Right Coronary Artery Vessel is small. Mid RCA lesion is 70% stenosed.  Intervention  No interventions have been documented.                Risk Assessment/Calculations:             Physical Exam:   VS:  BP 120/60 (BP Location: Left Arm, Patient Position: Sitting, Cuff Size: Normal)   Pulse 70   Ht 5\' 1"  (1.549 m)   Wt 137 lb 9.6 oz (62.4 kg)   SpO2 96%   BMI 26.00 kg/m    Wt Readings from Last 3 Encounters:  03/10/23 137 lb 9.6 oz (62.4 kg)  02/23/23 137 lb 4.8 oz (62.3 kg)  10/19/22 133 lb 6.4 oz (60.5 kg)    GEN: Well nourished, well developed in no acute distress NECK: No JVD; No carotid bruits CARDIAC: RRR, no murmurs, rubs, gallops RESPIRATORY:  Clear to auscultation without rales, wheezing or rhonchi  ABDOMEN: Soft, non-tender, non-distended EXTREMITIES:  No edema; No deformity   ASSESSMENT AND PLAN: .   CAD -nonobstructive per LHC in December 2023, Stable with no anginal symptoms. No indication for ischemic evaluation.  Continue aspirin 81 mg daily, continue Zetia 10 mg daily, continue Coreg 6.25 mg twice daily, continue Pravachol 40  mg daily. HFmrEF -NYHA class I, euvolemic.  Echo in December 2023 revealed EF 40 to 45%.  Continue Coreg 6.25 mg twice daily, continue Cozaar 100 mg daily, continue spironolactone 12.5 mg daily. Hypertension-blood pressure is well-controlled at 120/60, continue Cozaar 100 mg daily, continue Coreg 6.25 mg twice daily, continue spironolactone 12.5 mg daily. Hyperlipidemia-most recent LDL is well-controlled at 45, continue Pravachol 40 mg daily, continue Zetia 10 mg daily.       Dispo: Follow-up in 6 months, sooner if needed.  Signed, Flossie Dibble, NP

## 2023-08-02 DIAGNOSIS — M549 Dorsalgia, unspecified: Secondary | ICD-10-CM | POA: Diagnosis not present

## 2023-08-02 DIAGNOSIS — M545 Low back pain, unspecified: Secondary | ICD-10-CM | POA: Diagnosis not present

## 2023-08-02 DIAGNOSIS — R531 Weakness: Secondary | ICD-10-CM | POA: Diagnosis not present

## 2023-08-09 ENCOUNTER — Other Ambulatory Visit: Payer: Self-pay | Admitting: Internal Medicine

## 2023-08-17 ENCOUNTER — Other Ambulatory Visit: Payer: Self-pay | Admitting: Internal Medicine

## 2023-10-26 ENCOUNTER — Other Ambulatory Visit: Payer: Self-pay | Admitting: Internal Medicine

## 2023-11-08 ENCOUNTER — Other Ambulatory Visit: Payer: Self-pay | Admitting: Internal Medicine

## 2024-05-12 DIAGNOSIS — N390 Urinary tract infection, site not specified: Secondary | ICD-10-CM | POA: Diagnosis not present

## 2024-06-02 DIAGNOSIS — R1032 Left lower quadrant pain: Secondary | ICD-10-CM | POA: Diagnosis not present

## 2024-06-02 DIAGNOSIS — Z79899 Other long term (current) drug therapy: Secondary | ICD-10-CM | POA: Diagnosis not present

## 2024-06-02 DIAGNOSIS — I10 Essential (primary) hypertension: Secondary | ICD-10-CM | POA: Diagnosis not present

## 2024-06-02 DIAGNOSIS — R42 Dizziness and giddiness: Secondary | ICD-10-CM | POA: Diagnosis not present

## 2024-06-02 DIAGNOSIS — E119 Type 2 diabetes mellitus without complications: Secondary | ICD-10-CM | POA: Diagnosis not present

## 2024-06-02 DIAGNOSIS — E0590 Thyrotoxicosis, unspecified without thyrotoxic crisis or storm: Secondary | ICD-10-CM | POA: Diagnosis not present

## 2024-06-02 DIAGNOSIS — R079 Chest pain, unspecified: Secondary | ICD-10-CM | POA: Diagnosis not present

## 2024-06-02 DIAGNOSIS — J449 Chronic obstructive pulmonary disease, unspecified: Secondary | ICD-10-CM | POA: Diagnosis not present

## 2024-06-02 DIAGNOSIS — H7091 Unspecified mastoiditis, right ear: Secondary | ICD-10-CM | POA: Diagnosis not present

## 2024-06-02 DIAGNOSIS — R112 Nausea with vomiting, unspecified: Secondary | ICD-10-CM | POA: Diagnosis not present

## 2024-06-10 DIAGNOSIS — I10 Essential (primary) hypertension: Secondary | ICD-10-CM | POA: Diagnosis not present

## 2024-06-10 DIAGNOSIS — H7011 Chronic mastoiditis, right ear: Secondary | ICD-10-CM | POA: Diagnosis not present

## 2024-06-10 DIAGNOSIS — H6122 Impacted cerumen, left ear: Secondary | ICD-10-CM | POA: Diagnosis not present

## 2024-06-11 ENCOUNTER — Encounter: Payer: Self-pay | Admitting: Internal Medicine

## 2024-06-11 ENCOUNTER — Ambulatory Visit: Admitting: Internal Medicine

## 2024-06-11 VITALS — BP 116/70 | HR 94 | Temp 98.1°F | Resp 18 | Ht 61.0 in | Wt 134.0 lb

## 2024-06-11 DIAGNOSIS — R911 Solitary pulmonary nodule: Secondary | ICD-10-CM | POA: Insufficient documentation

## 2024-06-11 DIAGNOSIS — I5022 Chronic systolic (congestive) heart failure: Secondary | ICD-10-CM | POA: Diagnosis not present

## 2024-06-11 DIAGNOSIS — E210 Primary hyperparathyroidism: Secondary | ICD-10-CM

## 2024-06-11 DIAGNOSIS — E782 Mixed hyperlipidemia: Secondary | ICD-10-CM

## 2024-06-11 DIAGNOSIS — I700 Atherosclerosis of aorta: Secondary | ICD-10-CM | POA: Diagnosis not present

## 2024-06-11 DIAGNOSIS — R410 Disorientation, unspecified: Secondary | ICD-10-CM | POA: Insufficient documentation

## 2024-06-11 DIAGNOSIS — I2510 Atherosclerotic heart disease of native coronary artery without angina pectoris: Secondary | ICD-10-CM

## 2024-06-11 DIAGNOSIS — E063 Autoimmune thyroiditis: Secondary | ICD-10-CM

## 2024-06-11 DIAGNOSIS — Z8572 Personal history of non-Hodgkin lymphomas: Secondary | ICD-10-CM | POA: Insufficient documentation

## 2024-06-11 DIAGNOSIS — K5900 Constipation, unspecified: Secondary | ICD-10-CM | POA: Diagnosis not present

## 2024-06-11 DIAGNOSIS — Z6825 Body mass index (BMI) 25.0-25.9, adult: Secondary | ICD-10-CM | POA: Insufficient documentation

## 2024-06-11 DIAGNOSIS — I10 Essential (primary) hypertension: Secondary | ICD-10-CM | POA: Diagnosis not present

## 2024-06-11 DIAGNOSIS — K219 Gastro-esophageal reflux disease without esophagitis: Secondary | ICD-10-CM

## 2024-06-11 DIAGNOSIS — Z0000 Encounter for general adult medical examination without abnormal findings: Secondary | ICD-10-CM

## 2024-06-11 DIAGNOSIS — Z85038 Personal history of other malignant neoplasm of large intestine: Secondary | ICD-10-CM | POA: Insufficient documentation

## 2024-06-11 DIAGNOSIS — R739 Hyperglycemia, unspecified: Secondary | ICD-10-CM

## 2024-06-11 DIAGNOSIS — Z923 Personal history of irradiation: Secondary | ICD-10-CM

## 2024-06-11 HISTORY — DX: Atherosclerosis of aorta: I70.0

## 2024-06-11 HISTORY — DX: Disorientation, unspecified: R41.0

## 2024-06-11 HISTORY — DX: Chronic systolic (congestive) heart failure: I50.22

## 2024-06-11 HISTORY — DX: Body mass index (BMI) 25.0-25.9, adult: Z68.25

## 2024-06-11 HISTORY — DX: Solitary pulmonary nodule: R91.1

## 2024-06-11 HISTORY — DX: Essential (primary) hypertension: I10

## 2024-06-11 HISTORY — DX: Constipation, unspecified: K59.00

## 2024-06-11 LAB — LAB REPORT - SCANNED
Free T4: 1.09 ng/dL
TSH: 2.75 (ref 0.41–5.90)

## 2024-06-11 MED ORDER — FESOTERODINE FUMARATE ER 4 MG PO TB24: 4.0000 mg | ORAL_TABLET | Freq: Every day | ORAL | 3 refills | Status: AC

## 2024-06-11 MED ORDER — SPIRONOLACTONE 25 MG PO TABS: 12.5000 mg | ORAL_TABLET | Freq: Every day | ORAL | 1 refills | Status: AC

## 2024-06-11 MED ORDER — PRAVASTATIN SODIUM 40 MG PO TABS: 40.0000 mg | ORAL_TABLET | Freq: Every day | ORAL | 3 refills | Status: AC

## 2024-06-11 MED ORDER — EZETIMIBE 10 MG PO TABS: 10.0000 mg | ORAL_TABLET | Freq: Every evening | ORAL | 3 refills | Status: AC

## 2024-06-11 MED ORDER — POLYETHYLENE GLYCOL 3350 17 GM/SCOOP PO POWD: 17.0000 g | Freq: Every day | ORAL | 0 refills | Status: DC

## 2024-06-11 MED ORDER — CALCITRIOL 0.25 MCG PO CAPS: 0.2500 ug | ORAL_CAPSULE | Freq: Every day | ORAL | 0 refills | Status: DC

## 2024-06-11 MED ORDER — LOSARTAN POTASSIUM 100 MG PO TABS: 100.0000 mg | ORAL_TABLET | Freq: Every day | ORAL | 3 refills | Status: DC

## 2024-06-11 MED ORDER — SENNA 8.6 MG PO TABS: 2.0000 | ORAL_TABLET | Freq: Every day | ORAL | 0 refills | Status: AC

## 2024-06-11 MED ORDER — CARVEDILOL 6.25 MG PO TABS: 6.2500 mg | ORAL_TABLET | Freq: Two times a day (BID) | ORAL | 1 refills | Status: AC

## 2024-06-11 NOTE — Assessment & Plan Note (Signed)
 She is cured in regards to her cancer.

## 2024-06-11 NOTE — Assessment & Plan Note (Signed)
 She seems euvolemic at this time.  We will continue on spironolactone  and her ARB.

## 2024-06-11 NOTE — Progress Notes (Signed)
 Preventive Screening-Counseling & Management     Joy Daniels is a 83 y.o. female who presents for Medicare Annual/Subsequent preventive examination.  Joy Daniels is a 83 yo female who returns today for an annual wellness examination.  Her last eye exam was done in 07/2023 and her daughter states her vision is doing well.  She does have a history of colon cancer where she is s/p right hemicolectomy in 08/2013.  Her last colonoscopy was done by Dr. Larene on 12/19/2019 and this showed 2 colon polyps, internal hemorrhoids and normal anastomosis of her right hemicolectomy.  The patient does have a history of reflux but they only use omeprazole prn. Her last digital screening mammogram was done on 03/20/2018 and this was normal.  She does not want anymore mammograms.  She does not exercise regularly.  She does not smoke.  The patient no longer gets yearly flu vaccines.  She has had both pneumonia vaccines.  She has had the shingrix vaccines per her daughter.  The patient has had 2 COVID-19 vaccines.  There is no depression or anxiety.  The patient remains on an ASA 81mg  daily.    Went to ER on 05/12/2024 for urinary incontinence and confusion where they felt she had a UTI and sent her home on keflex.  She did improve back to her baseline.  However, she went back to the ER on 06/02/2024 with dizziness, nausea and vomiting.  They did a CT abd/pelvis which showed no acute abnormality.  They also did a CT scan of her head on 06/02/2024 and that showed acute mastoiditis.  She was sent home on augmentin at that time.  Today, her daughter states she is having confusion and memory loss over the last 2 weeks.  They admit to me that the patient has not had a BM in the last 2 weeks.  She has constipation with straining.  There is no fevers, chills, dysuria, hematuria, or increased urinary frequency.   Her daughter states that over the last 2 weeks she has not been getting up and was walking on her own prior to 2  weeks ago.  She is now using a rolling walker.  The patient also has a history of CAD where she was hospitalized in 08/21/2022 with chest pain and SOB.  They felt she has a NSTEMI with elevated troponins but no NSTEMI on EKG.  Cardiology was consulted and she was sent to [facility] for evaluation.  While at [facility], she did have a CT of her chest/abdomen/pelvis that showed a new pleural base nodule in her left lower lung but no findings of active malignancy in her abd/pelvis.  There was stable peripheral scarring in the right upper lobe and right lower lobe with aortic atherosclerosis.  She had an ECHO done on 08/22/2022 at [facility] which a LVEF 40-45% without regional wall motion abnormalities with her right ventricle mildly enlarged and right ventricular global systolic function moderately reduced with mild to moderate TR.  She was transferred to [facility] where she underwent a left heart catherization on 08/25/2022 that showed a 1st Diag lesion 50% stenosed, mid RCA lesion 70% stenosed her LV systolic function was normal with LVEF of 50-55%.  She was noted to have small vessel disease with no PCI targets and cardiology recommended medical therapy.  She was discharged from [facility] on 08/30/2023 where she went to [facility].   The patient stayed at [facility] for 3 weeks.  The family does not want homehealth  therapy as she does not want to participate.  She did see cardiology as an outpatient on 09/20/2022 where she was felt to have CAD s/p NSTEMI.  She has some left-sided chest pain that is intermittent, not worsened with exertion. There was no associated shortness of breath, n/v, or diaphoresis. Cardiology felt her symptoms seemed to be more musculoskeletal. Encouragement given to increase activity and report worsening symptoms if they occur. They also discussed her HFmrEF: Echo with LVEF 40 to 45% 08/22/22, LV gram 12/14 50-55%. She appeared to be euvolemic at that time without dyspnea, orthopnea, PND, edema. Spends the  majority of her time in bed. Encouraged increased activity. She is tolerating current GDMT low-dose carvedilol  and spironolactone .  She last saw cardiology on 03/09/2024 which they felt her CAD was stable and she was not having angina.  They felt her systolic CHF was NYHA class I and she was euvolemic.    The patient had a lung nodule as described above where I saw her in 10/2022.  I had reviewed her notes from her past visit with Dr. Ezzard and  I did contact him on the phone in regards to this new pulmonary nodules.  She had a CT scan of her chest in 01/2022 and had a PET scan prior to that.  Her CT scan showed no recurrent right pleural effusion or increasing right lung nodularity. The previously demonstrated pulmonary nodules were stable to improved from the most recent PET-CT of 6 months prior.  There was was no new enlarging pulmonary nodules or thoracic adenopathy.  However, this CT scan of her chest/abd/pelvis done with this recent hospitalization in 08/2022 shows this new pulmonary nodule.  Dr. Ezzard and I recommend that if the patient wanted a workup,she could be referred to pulmonary to set her up to have a biopsy.  She has seen Dr. Beulah in followup and they did not want to do a biopsy and just followup with this.  There was mention of possible COPD but I do not see any spirometry reports in her chart.  She is not on any inhalers.  The patient also has a hsitory of stage IIA (T3 N0 M0) colon cancer, status post a right hemicolectomy in 08/2013.  He felt that as she was cancer free for 5 years, she has been considered cured of this disease.  The patient also has a history of stage IVA mantle cell lymphoma, for which she underwent 6 cycles of bendamustine/Rituxan, followed by 2 years of maintenance Rituxan, which were completed in March 2014.  She denied having any B symptoms or lymphadenopathy that concerns her for recurrent mantle cell lymphoma.  Her last visit with Oncology was on 02/2023 and again  they considered her colon cancer cured.    The patient is a 83 year old Caucasian/White female who presents for a followup evaluation of hypertension. The patient has not been checking her blood pressure at home. The patient's current medications include: losartan 100mg  daily, carvedilol  6.25mg  BID, and spironolactone  12.5mg  daily.  The patient has been tolerating her medications well. The patient denies any headaches, blurred vision, double vision, dizziness, chest pain, palptitations, shortness of breath, generalized weakness or edema.  She reports there have been no other symptoms noted.   Joy Daniels returns today for routine followup on her cholesterol. Overall, she states she is doing well and is without any complaints or problems at this time. She specifically denies chest pain, abdominal pain, nausea, diarrhea, and myalgias. She remains on dietary  management as well as the following cholesterol lowering medications ezetimibe  10 mg daily and pravastatin  40mg  at bedtime.  She last ate less than 4-6 hours ago so we will have to hold on lab draws today.   She also has a history of primary hyperparathyroidism which was diagnosed back in 2022.  She was having hypercalcemia and on review of her records, she had seen [facility] general surgery who planned to do a left superior parathyroidectomy in scheduled in 08/2021.  However, she never did go for this as she was admitted with her heart problems.    There is a mention of autoimmune hypothyroidism in her chart.  Her daughter tells me that she did have an radioactive iodine ablation done in 1990.        Are there smokers in your home (other than you)? Husband smokes outside  Risk Factors Current exercise habits: as above.  Dietary issues discussed: none   Depression Screen (Note: if answer to either of the following is Yes, a more complete depression screening is indicated)   Over the past two weeks, have you felt down, depressed or hopeless?  Yes  Over the past two weeks, have you felt little interest or pleasure in doing things? Yes  Have you lost interest or pleasure in daily life? Yes  Do you often feel hopeless? No  Do you cry easily over simple problems? No  Activities of Daily Living In your present state of health, do you have any difficulty performing the following activities?:  Driving? Yes Managing money?  Yes Feeding yourself? No Getting from bed to chair? No Climbing a flight of stairs? Yes Preparing food and eating?: No Bathing or showering? No Getting dressed: No Getting to the toilet? No Using the toilet:No Moving around from place to place: No In the past year have you fallen or had a near fall?:No     Hearing Difficulties: No Do you often ask people to speak up or repeat themselves? No Do you experience ringing or noises in your ears? No Do you have difficulty understanding soft or whispered voices? No   Do you feel that you have a problem with memory? Yes  Do you often misplace items? No  Do you feel safe at home?  Yes  Cognitive Testing   She scored a 12/30 today on MMSE- howeveer she is confused and this is new over the last 2 weeks.   Fall Risk Prevention  Any stairs in or around the home? Yes  If so, are there any without handrails? Yes  Home free of loose throw rugs in walkways, pet beds, electrical cords, etc? Yes  Adequate lighting in your home to reduce risk of falls? Yes  Use of a cane, walker or w/c? Yes    Time Up and Go  Was the test performed? No .      Advanced Directives have been discussed with the patient? Yes   List the Names of Other Physician/Practitioners you currently use: Patient Care Team: Fleeta Valeria Mayo, MD as PCP - General (Internal Medicine) Bernie Lamar PARAS, MD as PCP - Cardiology (Cardiology)    Past Medical History:  Diagnosis Date   Combined hyperlipidemia    COPD (chronic obstructive pulmonary disease) (HCC)    Dysgeusia    GERD  (gastroesophageal reflux disease)    History of colon cancer    History of lymphoma    Hypercalcemia    Hyperparathyroidism , secondary, non-renal    Hypertension    Hypothyroidism,  acquired, autoimmune    Iron deficiency    Lymphoma, mantle cell, multiple sites (HCC)    Nontoxic multinodular goiter    Obesity (BMI 30.0-34.9)    Other osteoporosis    Pallor    Thyroiditis, autoimmune    Toxic nodular goiter    Vitamin D  deficiency disease     Past Surgical History:  Procedure Laterality Date   HAND SURGERY     LEFT HEART CATH AND CORONARY ANGIOGRAPHY N/A 08/25/2022   Procedure: LEFT HEART CATH AND CORONARY ANGIOGRAPHY;  Surgeon: Dann Candyce RAMAN, MD;  Location: [facility] INVASIVE CV LAB;  Service: Cardiovascular;  Laterality: N/A;      Current Medications  Current Outpatient Medications  Medication Sig Dispense Refill   aspirin  81 MG chewable tablet Chew 81 mg by mouth every evening.     calcitRIOL  (ROCALTROL ) 0.25 MCG capsule Take 1 capsule (0.25 mcg total) by mouth daily. Morning 30 capsule 0   carvedilol  (COREG ) 6.25 MG tablet TAKE 1 TABLET BY MOUTH 2 TIMES DAILY WITH A MEAL. 180 tablet 1   ezetimibe  (ZETIA ) 10 MG tablet Take 1 tablet (10 mg total) by mouth every evening. 90 tablet 3   losartan (COZAAR) 100 MG tablet TAKE 1 TABLET EVERY DAY 90 tablet 3   pravastatin  (PRAVACHOL ) 40 MG tablet TAKE 1 TABLET AT BEDTIME 90 tablet 3   fesoterodine  (TOVIAZ ) 4 MG TB24 tablet Take 4 mg by mouth daily. (Patient not taking: Reported on 06/11/2024)     spironolactone  (ALDACTONE ) 25 MG tablet Take 0.5 tablets (12.5 mg total) by mouth daily. (Patient not taking: Reported on 06/11/2024) 45 tablet 1   No current facility-administered medications for this visit.    Allergies Codeine and Latex   Social History Social History   Tobacco Use   Smoking status: Never    Passive exposure: Yes   Smokeless tobacco: Never  Substance Use Topics   Alcohol  use: No     Review of Systems Review  of Systems  Constitutional:  Negative for chills, fever, malaise/fatigue and weight loss.  Eyes:  Negative for blurred vision and double vision.  Respiratory:  Negative for cough, hemoptysis, shortness of breath and wheezing.   Cardiovascular:  Negative for chest pain, palpitations and leg swelling.  Gastrointestinal:  Positive for constipation. Negative for abdominal pain, blood in stool, diarrhea, heartburn, melena, nausea and vomiting.  Genitourinary:  Negative for frequency and hematuria.  Musculoskeletal:  Negative for myalgias.  Skin:  Negative for itching and rash.  Neurological:  Negative for dizziness, weakness and headaches.  Endo/Heme/Allergies:  Negative for polydipsia.     Physical Exam:      Body mass index is 25.32 kg/m. BP 116/70   Pulse 94   Temp 98.1 F (36.7 C)   Resp 18   Ht 5' 1 (1.549 m)   Wt 134 lb (60.8 kg)   SpO2 96%   BMI 25.32 kg/m   Physical Exam Constitutional:      Appearance: Normal appearance. She is not ill-appearing.  HENT:     Head: Normocephalic and atraumatic.     Right Ear: Tympanic membrane, ear canal and external ear normal.     Left Ear: Tympanic membrane, ear canal and external ear normal.     Nose: Nose normal. No congestion or rhinorrhea.     Mouth/Throat:     Mouth: Mucous membranes are moist.     Pharynx: Oropharynx is clear. No posterior oropharyngeal erythema.  Eyes:     General: No scleral  icterus.    Conjunctiva/sclera: Conjunctivae normal.     Pupils: Pupils are equal, round, and reactive to light.  Neck:     Thyroid : No thyromegaly.     Vascular: No carotid bruit.  Cardiovascular:     Rate and Rhythm: Normal rate and regular rhythm.     Pulses: Normal pulses.     Heart sounds: Normal heart sounds. No murmur heard.    No friction rub. No gallop.  Pulmonary:     Effort: Pulmonary effort is normal. No respiratory distress.     Breath sounds: Normal breath sounds. No wheezing, rhonchi or rales.  Abdominal:      General: Abdomen is flat. Bowel sounds are normal. There is no distension.     Palpations: Abdomen is soft.     Tenderness: There is no abdominal tenderness.  Musculoskeletal:     Cervical back: Normal range of motion. No tenderness.     Right lower leg: No edema.     Left lower leg: No edema.     Comments: No clubbing or cyanosis  Lymphadenopathy:     Cervical: No cervical adenopathy.  Skin:    General: Skin is warm and dry.     Findings: No rash.  Neurological:     General: No focal deficit present.     Mental Status: She is alert and oriented to person, place, and time.     Comments: CN II-XII grossly intact  Psychiatric:        Mood and Affect: Mood normal.        Behavior: Behavior normal.      Assessment:      Coronary artery disease involving native coronary artery of native heart without angina pectoris  Aortic atherosclerosis  Chronic systolic (congestive) heart failure (HCC)  Constipation, unspecified constipation type  Confusion  Pulmonary nodule  History of colon cancer  History of lymphoma  Essential hypertension  Mixed hyperlipidemia  Primary hyperparathyroidism  History of radioactive iodine thyroid  ablation  Gastroesophageal reflux disease, unspecified whether esophagitis present  BMI 25.0-25.9,adult  Lung nodule  Hypothyroidism, acquired, autoimmune    Plan:     During the course of the visit the patient was educated and counseled about appropriate screening and preventive services including:   Pneumococcal vaccine  Influenza vaccine Screening mammography Colorectal cancer screening Advanced directives: discussed  Diet review for nutrition referral? Yes ____  Not Indicated _X___   Patient Instructions (the written plan) was given to the patient.  Aortic atherosclerosis We will continue on her pravastatin  and zetia .    Chronic systolic (congestive) heart failure (HCC) She seems euvolemic at this time.  We will continue on  spironolactone  and her ARB.  Coronary artery disease involving native coronary artery of native heart without angina pectoris She denies any angina at this time.  WE will contionue on her current meds including her BB, ARB, statin and her ASA.  Essential hypertension Her BP is controlled.  WE will continue her current BP meds.  Lung nodule She has been seen by pulmonary and the family has decided not to do anything.  GERD (gastroesophageal reflux disease) Continue omeprazole as needed.  Hypothyroidism, acquired, autoimmune We will check her TFT's today.  Primary hyperparathyroidism I will check her PTH and calcium  levels today.  Confusion I wonder if her new confusion is related to her constipation.  We will obtain a KUB of her abdomen and obtain labs to see if she is having any problems.  BMI 25.0-25.9,adult I want  her to eat healthy and keep active.  Constipation We will give her a fleets enema, obtain a KUB and start her on a bowel regimen.  History of colon cancer She is cured in regards to her cancer.  History of lymphoma We will obtain some labs including a CBC.  Her lymphoma has been in remission.  History of radioactive iodine thyroid  ablation Plan as above.   Prevention Health maintenacne discussed.  She has aged out of colonoscopy.  She does not want anymore mammograms.  We will obtain some yearly labs.   Medicare Attestation I have personally reviewed: The patient's medical and social history Their use of alcohol , tobacco or illicit drugs Their current medications and supplements The patient's functional ability including ADLs,fall risks, home safety risks, cognitive, and hearing and visual impairment Diet and physical activities Evidence for depression or mood disorders  The patient's weight, height, and BMI have been recorded in the chart.  I have made referrals, counseling, and provided education to the patient based on review of the above and I have  provided the patient with a written personalized care plan for preventive services.     Selinda Fleeta Finger, MD   06/11/2024

## 2024-06-11 NOTE — Assessment & Plan Note (Signed)
 She denies any angina at this time.  WE will contionue on her current meds including her BB, ARB, statin and her ASA.

## 2024-06-11 NOTE — Assessment & Plan Note (Signed)
 Her BP is controlled.  WE will continue her current BP meds.

## 2024-06-11 NOTE — Assessment & Plan Note (Signed)
 Plan as above.

## 2024-06-11 NOTE — Assessment & Plan Note (Signed)
 She has been seen by pulmonary and the family has decided not to do anything.

## 2024-06-11 NOTE — Assessment & Plan Note (Signed)
We will check her TFT's today.

## 2024-06-11 NOTE — Assessment & Plan Note (Signed)
 I want her to eat healthy and keep active.

## 2024-06-11 NOTE — Assessment & Plan Note (Signed)
 We will obtain some labs including a CBC.  Her lymphoma has been in remission.

## 2024-06-11 NOTE — Assessment & Plan Note (Signed)
 I wonder if her new confusion is related to her constipation.  We will obtain a KUB of her abdomen and obtain labs to see if she is having any problems.

## 2024-06-11 NOTE — Assessment & Plan Note (Signed)
I will check her PTH and calcium levels today

## 2024-06-11 NOTE — Assessment & Plan Note (Signed)
 We will give her a fleets enema, obtain a KUB and start her on a bowel regimen.

## 2024-06-11 NOTE — Assessment & Plan Note (Signed)
 We will continue on her pravastatin  and zetia .

## 2024-06-11 NOTE — Assessment & Plan Note (Signed)
 Continue omeprazole as needed

## 2024-06-12 ENCOUNTER — Other Ambulatory Visit: Payer: Self-pay

## 2024-06-12 MED ORDER — POLYETHYLENE GLYCOL 3350 17 GM/SCOOP PO POWD: 17.0000 g | Freq: Every day | ORAL | 0 refills | Status: AC

## 2024-06-13 ENCOUNTER — Encounter: Payer: Self-pay | Admitting: Internal Medicine

## 2024-06-19 ENCOUNTER — Encounter: Payer: Self-pay | Admitting: Internal Medicine

## 2024-07-01 ENCOUNTER — Other Ambulatory Visit: Payer: Self-pay

## 2024-07-03 ENCOUNTER — Other Ambulatory Visit: Payer: Self-pay | Admitting: Internal Medicine

## 2024-07-05 ENCOUNTER — Ambulatory Visit

## 2024-07-05 VITALS — BP 102/62 | HR 83 | Ht 61.6 in | Wt 133.0 lb

## 2024-07-05 DIAGNOSIS — I2510 Atherosclerotic heart disease of native coronary artery without angina pectoris: Secondary | ICD-10-CM | POA: Diagnosis not present

## 2024-07-05 DIAGNOSIS — E782 Mixed hyperlipidemia: Secondary | ICD-10-CM | POA: Diagnosis not present

## 2024-07-05 DIAGNOSIS — I10 Essential (primary) hypertension: Secondary | ICD-10-CM

## 2024-07-05 DIAGNOSIS — I77810 Thoracic aortic ectasia: Secondary | ICD-10-CM | POA: Diagnosis not present

## 2024-07-05 DIAGNOSIS — I5022 Chronic systolic (congestive) heart failure: Secondary | ICD-10-CM

## 2024-07-05 MED ORDER — LOSARTAN POTASSIUM 50 MG PO TABS: 50.0000 mg | ORAL_TABLET | Freq: Every day | ORAL | 3 refills | Status: AC

## 2024-07-05 NOTE — Assessment & Plan Note (Signed)
 No recent lipid panel available.  Prior lipid panel reviewed 08/27/2022 total cholesterol 88, HDL 29, LDL 45, triglycerides 69. Continue current medications pravastatin  40 mg once daily and Zetia  10 mg once daily.   Consider fasting lipid panel at follow-up visit with PCP with regular labs.

## 2024-07-05 NOTE — Assessment & Plan Note (Signed)
 Moderate nonobstructive disease on cardiac cath December 2023 at [facility].  No PCI targets. Continued on medical therapy. Remains on aspirin  81 mg once daily. Continue lipid-lowering therapy with pravastatin  40 mg once daily and Zetia  10 mg once daily.

## 2024-07-05 NOTE — Patient Instructions (Signed)
 Medication Instructions:    DECREASE: Losartan to 50mg  daily   Lab Work: None Ordered If you have labs (blood work) drawn today and your tests are completely normal, you will receive your results only by: MyChart Message (if you have MyChart) OR A paper copy in the mail If you have any lab test that is abnormal or we need to change your treatment, we will call you to review the results.   Testing/Procedures: Your physician has requested that you have an echocardiogram. Echocardiography is a painless test that uses sound waves to create images of your heart. It provides your doctor with information about the size and shape of your heart and how well your heart's chambers and valves are working. This procedure takes approximately one hour. There are no restrictions for this procedure. Please do NOT wear cologne, perfume, aftershave, or lotions (deodorant is allowed). Please arrive 15 minutes prior to your appointment time.  Please note: We ask at that you not bring children with you during ultrasound (echo/ vascular) testing. Due to room size and safety concerns, children are not allowed in the ultrasound rooms during exams. Our front office staff cannot provide observation of children in our lobby area while testing is being conducted. An adult accompanying a patient to their appointment will only be allowed in the ultrasound room at the discretion of the ultrasound technician under special circumstances. We apologize for any inconvenience.    Follow-Up: At [facility], you and your health needs are our priority.  As part of our continuing mission to provide you with exceptional heart care, we have created designated Provider Care Teams.  These Care Teams include your primary Cardiologist (physician) and Advanced Practice Providers (APPs -  Physician Assistants and Nurse Practitioners) who all work together to provide you with the care you need, when you need it.  We recommend signing up  for the patient portal called MyChart.  Sign up information is provided on this After Visit Summary.  MyChart is used to connect with patients for Virtual Visits (Telemedicine).  Patients are able to view lab/test results, encounter notes, upcoming appointments, etc.  Non-urgent messages can be sent to your provider as well.   To learn more about what you can do with MyChart, go to [email or web address].    Your next appointment:   3 month(s)  The format for your next appointment:   In Person  Provider:   Lamar Fitch, MD    Other Instructions NA

## 2024-07-05 NOTE — Assessment & Plan Note (Signed)
 Well-controlled. In view of her overall low blood pressures and high risk for falls lowered the losartan dose to 50 mg once daily.  Also on carvedilol  6.25 mg twice daily and spironolactone  12.5 mg once daily.  If repeat echocardiogram shows preserved or normalized LV function we will further titrate down medications by cutting down carvedilol  to 3.125 mg twice daily and discontinuing spironolactone .

## 2024-07-05 NOTE — Assessment & Plan Note (Signed)
 Appears stated and euvolemic. Frail elderly lady with limited functional status.  Currently not on any loop diuretic. Continue with salt restriction less than 2 g/day.  From guideline directed medical therapy standpoint currently on: Carvedilol  6.25 mg twice daily, continue the same. Currently on losartan 100 mg, lowered the dose to 50 mg once daily. On spironolactone  12.5 mg once daily, continue the same. Appears to be a poor candidate for SGLT2 inhibitors given limited mobility and high risk for UTIs.  Reassess LV function with echocardiogram.

## 2024-07-05 NOTE — Progress Notes (Signed)
 Cardiology Consultation:    Date:  07/05/2024   ID:  Joy Daniels, DOB 1941-06-19, MRN [id number]  PCP:  Fleeta Valeria Mayo, MD  Cardiologist:  Alean SAUNDERS Taquanna Borras, MD   Referring MD: Fleeta Valeria Mayo, MD   No chief complaint on file.    ASSESSMENT AND PLAN:   Ms. Joy Daniels 83 year old woman with history of CHF reduced LVEF diagnosed December 2023 at [facility] EF 40 to 45%, CAD workup with cardiac cath at [facility] December 2023 noted moderate nonobstructive disease and LV gram 50 to 55%.  Also has history of aortic atherosclerosis, hypertension, hyperlipidemia, mantle cell lymphoma, hyperparathyroidism, hypothyroidism with prior radioactive thyroid  ablation history, colon cancer s/p hemicolectomy December 2014, GERD, ascending aorta dilatation 4.1 cm [CT chest May 2023] frail deconditioned, high fall risk.  Here for follow-up visit, her last visit with cardiology was in June 2024.  Daughter helps manage her medications and health care appointments and has found it difficult to keep up with appointments and is now reestablishing care.  Problem List Items Addressed This Visit     Mixed hyperlipidemia   No recent lipid panel available.  Prior lipid panel reviewed 08/27/2022 total cholesterol 88, HDL 29, LDL 45, triglycerides 69. Continue current medications pravastatin  40 mg once daily and Zetia  10 mg once daily.   Consider fasting lipid panel at follow-up visit with PCP with regular labs.      Relevant Medications   losartan (COZAAR) 50 MG tablet   Mild ascending aorta dilatation   Mild ascending aorta dilatation 4.1 cm by CT chest May 2023. Reassess size based on echocardiogram being ordered.      Relevant Medications   losartan (COZAAR) 50 MG tablet   Coronary artery disease involving native coronary artery of native heart without angina pectoris - Primary   Moderate nonobstructive disease on cardiac cath December 2023 at [facility].  No PCI  targets. Continued on medical therapy. Remains on aspirin  81 mg once daily. Continue lipid-lowering therapy with pravastatin  40 mg once daily and Zetia  10 mg once daily.       Relevant Medications   losartan (COZAAR) 50 MG tablet   Other Relevant Orders   EKG 12-Lead (Completed)   ECHOCARDIOGRAM COMPLETE   Chronic systolic (congestive) heart failure (HCC)   Appears stated and euvolemic. Frail elderly lady with limited functional status.  Currently not on any loop diuretic. Continue with salt restriction less than 2 g/day.  From guideline directed medical therapy standpoint currently on: Carvedilol  6.25 mg twice daily, continue the same. Currently on losartan 100 mg, lowered the dose to 50 mg once daily. On spironolactone  12.5 mg once daily, continue the same. Appears to be a poor candidate for SGLT2 inhibitors given limited mobility and high risk for UTIs.  Reassess LV function with echocardiogram.      Relevant Medications   losartan (COZAAR) 50 MG tablet   Essential hypertension   Well-controlled. In view of her overall low blood pressures and high risk for falls lowered the losartan dose to 50 mg once daily.  Also on carvedilol  6.25 mg twice daily and spironolactone  12.5 mg once daily.  If repeat echocardiogram shows preserved or normalized LV function we will further titrate down medications by cutting down carvedilol  to 3.125 mg twice daily and discontinuing spironolactone .       Relevant Medications   losartan (COZAAR) 50 MG tablet   Return to clinic in 3 months.   History of Present Illness:  Joy Daniels is a 83 y.o. female who is being seen today for follow-up visit. PCP is Fleeta Finger, Selinda, MD. Last visit with us  in the office was 03/10/2023 with Delon Hoover, NP-C.  Pleasant woman here for the visit today accompanied by her daughter and son.  Currently lives with her daughter at home.  She is mostly sedentary at home and uses a walker to ambulate in  the house.  Couple weeks ago she sustained a mechanical fall bruising of her face which is gradually recovering.  Has history of CHF [LVEF 40 to 45% December 2023 at [facility]], nonobstructive CAD [cardiac cath [facility] December 2023 no PCI targets and LV gram EF felt to be 50 to 55%], aortic atherosclerosis, hypertension, hyperlipidemia, mantle cell lymphoma, hyperparathyroidism, hypothyroidism s/p prior radioactive thyroid  ablation, GERD, colon cancer s/p hemicolectomy 08/2013.  She denies any symptoms of chest pain or palpitations.  General weakness present and ambulation has been difficult. Denies any blood in urine or stools. Denies any orthopnea or paroxysmal nocturnal dyspnea. No pedal edema. Blood pressures as per daughter at home have been relatively on the lower side.  EKG in clinic today shows sinus rhythm with frequent PACs, incomplete RBBB morphology with nonspecific repolarization changes and lateral precordial leads.  Lateral precordial leads ST depression and T wave changes new in comparison to prior EKG from June 2024.   Blood work from 06/11/2024 reviewed WBC 7.5, hemoglobin 14, hematocrit 42.1, platelets 167 Thyroid  panel TSH normal 2.75, free T4 normal 1.09 and free T3 normal 2.94. BUN 23, creatinine 1.1, eGFR 50, CKD stage III. Normal transaminases and alkaline phosphatase Total bilirubin mildly elevated 1.5 Calcium  11.2, elevated known history of hyperparathyroidism, follow-up as per PCP.    Past Medical History:  Diagnosis Date   Abnormal echocardiogram 08/25/2022   Aortic atherosclerosis 06/11/2024   BMI 25.0-25.9,adult 06/11/2024   CHEST PAIN UNSPECIFIED 02/11/2010   Qualifier: Diagnosis of   By: Delford, MD, CODY Maude Dunnings        Chronic systolic (congestive) heart failure (HCC) 06/11/2024   Colon cancer (HCC) 12/09/2013   Confusion 06/11/2024   Constipation 06/11/2024   Coronary artery disease involving native coronary artery of native  heart without angina pectoris 10/19/2022   Essential hypertension 06/11/2024   GERD (gastroesophageal reflux disease)    History of colon cancer    History of lymphoma    History of radioactive iodine thyroid  ablation 06/24/2021   Hypercalcemia    Hyperparathyroidism , secondary, non-renal    Hypocalcemia 06/11/2014   Hypothyroidism, acquired, autoimmune    Iron deficiency    Lung nodule 10/19/2022   Lymphoma, mantle cell, multiple sites (HCC)    Mixed hyperlipidemia 02/11/2010   Qualifier: Diagnosis of   By: Delford, MD, CODY Maude Dunnings        Nontoxic multinodular goiter    NSTEMI (non-ST elevated myocardial infarction) (HCC) 08/25/2022   Obesity (BMI 30.0-34.9)    Other osteoporosis    Pallor    Pleural effusion, right 07/07/2021   Primary hyperparathyroidism 06/24/2021   Pulmonary nodule 06/11/2024   Thoracic aortic aneurysm (TAA) 10/12/2010   Qualifier: Diagnosis of   By: Pietro, MD, CODY Redell Dimes        Thyroiditis, autoimmune    Toxic nodular goiter    Vitamin D  deficiency disease     Past Surgical History:  Procedure Laterality Date   HAND SURGERY     LEFT HEART CATH AND CORONARY ANGIOGRAPHY N/A 08/25/2022   Procedure:  LEFT HEART CATH AND CORONARY ANGIOGRAPHY;  Surgeon: Dann Candyce RAMAN, MD;  Location: [facility] INVASIVE CV LAB;  Service: Cardiovascular;  Laterality: N/A;    Current Medications: Current Meds  Medication Sig   calcitRIOL  (ROCALTROL ) 0.25 MCG capsule TAKE 1 CAPSULE EVERY DAY MORNING   carvedilol  (COREG ) 6.25 MG tablet Take 1 tablet (6.25 mg total) by mouth 2 (two) times daily with a meal.   ezetimibe  (ZETIA ) 10 MG tablet Take 1 tablet (10 mg total) by mouth every evening.   fesoterodine  (TOVIAZ ) 4 MG TB24 tablet Take 1 tablet (4 mg total) by mouth daily.   losartan (COZAAR) 50 MG tablet Take 1 tablet (50 mg total) by mouth daily.   polyethylene glycol powder (MIRALAX) 17 GM/SCOOP powder Take 17 g by mouth daily. Dissolve 1 capful (17g) in  4-8 ounces of liquid and take by mouth daily.   pravastatin  (PRAVACHOL ) 40 MG tablet Take 1 tablet (40 mg total) by mouth at bedtime.   senna (SENOKOT) 8.6 MG TABS tablet Take 2 tablets (17.2 mg total) by mouth daily.   spironolactone  (ALDACTONE ) 25 MG tablet Take 0.5 tablets (12.5 mg total) by mouth daily.   [DISCONTINUED] losartan (COZAAR) 100 MG tablet Take 1 tablet (100 mg total) by mouth daily.     Allergies:   Codeine and Latex   Social History   Socioeconomic History   Marital status: Married    Spouse name: Not on file   Number of children: Not on file   Years of education: Not on file   Highest education level: Not on file  Occupational History   Not on file  Tobacco Use   Smoking status: Never    Passive exposure: Yes   Smokeless tobacco: Never  Vaping Use   Vaping status: Never Used  Substance and Sexual Activity   Alcohol  use: No   Drug use: No   Sexual activity: Not Currently  Other Topics Concern   Not on file  Social History Narrative   Not on file   Social Drivers of Health   Financial Resource Strain: Not on file  Food Insecurity: No Food Insecurity (06/18/2021)   Hunger Vital Sign    Worried About Running Out of Food in the Last Year: Never true    Ran Out of Food in the Last Year: Never true  Transportation Needs: No Transportation Needs (06/18/2021)   PRAPARE - Administrator, Civil Service (Medical): No    Lack of Transportation (Non-Medical): No  Physical Activity: Not on file  Stress: Not on file  Social Connections: Not on file     Family History: The patient's family history includes Cancer in her sister; Diabetes in her father; Heart disease in her sister. ROS:   Please see the history of present illness.    All 14 point review of systems negative except as described per history of present illness.  EKGs/Labs/Other Studies Reviewed:    The following studies were reviewed today:   EKG:  EKG  Interpretation Date/Time:  Friday July 05 2024 11:24:34 EDT Ventricular Rate:  83 PR Interval:  146 QRS Duration:  112 QT Interval:  398 QTC Calculation: 467 R Axis:   -21  Text Interpretation: Sinus rhythm with Premature supraventricular complexes Incomplete left bundle branch block Left ventricular hypertrophy with repolarization abnormality Abnormal ECG When compared with ECG of 10-Mar-2023 14:15, Premature supraventricular complexes are now Present Inverted T waves have replaced nonspecific T wave abnormality in Inferior leads QT has lengthened Confirmed  by Liborio Hai reddy [phone]) on 07/05/2024 12:01:37 PM    Recent Labs: 06/11/2024: TSH 2.75  Recent Lipid Panel    Component Value Date/Time   CHOL 88 08/27/2022 0148   TRIG 69 08/27/2022 0148   HDL 29 (L) 08/27/2022 0148   CHOLHDL 3.0 08/27/2022 0148   VLDL 14 08/27/2022 0148   LDLCALC 45 08/27/2022 0148    Physical Exam:    VS:  BP 102/62   Pulse 83   Ht 5' 1.6 (1.565 m)   Wt 133 lb (60.3 kg)   SpO2 94%   BMI 24.64 kg/m     Wt Readings from Last 3 Encounters:  07/05/24 133 lb (60.3 kg)  06/11/24 134 lb (60.8 kg)  03/10/23 137 lb 9.6 oz (62.4 kg)     GENERAL:  Well nourished, well developed in no acute distress NECK: No JVD; No carotid bruits CARDIAC: RRR, S1 and S2 present, no murmurs, no rubs, no gallops CHEST:  Clear to auscultation without rales, wheezing or rhonchi  Extremities: No pitting pedal edema. Pulses bilaterally symmetric with radial 2+ and dorsalis pedis 2+ NEUROLOGIC:  Alert and oriented x 3  Medication Adjustments/Labs and Tests Ordered: Current medicines are reviewed at length with the patient today.  Concerns regarding medicines are outlined above.  Orders Placed This Encounter  Procedures   EKG 12-Lead   ECHOCARDIOGRAM COMPLETE   Meds ordered this encounter  Medications   losartan (COZAAR) 50 MG tablet    Sig: Take 1 tablet (50 mg total) by mouth daily.    Dispense:  90  tablet    Refill:  3    Signed, Rae Sutcliffe reddy Davionne Dowty, MD, MPH, [facility]. 07/05/2024 12:48 PM    [facility]

## 2024-07-05 NOTE — Assessment & Plan Note (Signed)
 Mild ascending aorta dilatation 4.1 cm by CT chest May 2023. Reassess size based on echocardiogram being ordered.

## 2024-07-09 ENCOUNTER — Ambulatory Visit: Admitting: Internal Medicine

## 2024-07-09 ENCOUNTER — Encounter: Payer: Self-pay | Admitting: Internal Medicine

## 2024-07-09 VITALS — BP 140/72 | HR 76 | Temp 97.4°F | Resp 16 | Ht 61.0 in | Wt 133.0 lb

## 2024-07-09 DIAGNOSIS — E213 Hyperparathyroidism, unspecified: Secondary | ICD-10-CM | POA: Diagnosis not present

## 2024-07-09 HISTORY — DX: Hyperparathyroidism, unspecified: E21.3

## 2024-07-09 NOTE — Assessment & Plan Note (Signed)
 I am still going to do a repeat CMP and PTH.  She has a NM parathyroid  scan in 04/2021 and she needs to have this adenoma removed.  We will refer her to general surgery.

## 2024-07-09 NOTE — Progress Notes (Signed)
 Office Visit  Subjective   Patient ID: Joy Daniels   DOB: 1941-05-16   Age: 83 y.o.   MRN: [id number]   Chief Complaint Chief Complaint  Patient presents with   Follow-up    4 Week follow up     History of Present Illness Joy Daniels is a 83 yo female who returns today for followup on her confusion.  I saw her for a yearly exam on 06/11/2024 where she was having confusion that started 2 weeks prior.  She went to ER on 05/12/2024 for urinary incontinence and confusion where they felt she had a UTI and sent her home on keflex.  She did improve back to her baseline.  However, she went back to the ER on 06/02/2024 with dizziness, nausea and vomiting.  They did a CT abd/pelvis which showed no acute abnormality.  They also did a CT scan of her head on 06/02/2024 and that showed acute mastoiditis.  She was sent home on augmentin at that time.  Today, her daughter states she is having confusion and memory loss over the last 2 weeks.  They admit to me that the patient has not had a BM in the last 2 weeks.  She has constipation with straining.  There is no fevers, chills, dysuria, hematuria, or increased urinary frequency.   Her daughter states that over the last 2 weeks she has not been getting up and was walking on her own prior to 2 weeks prior to all this.  I sent her for labs and a KUB.  Her KUB on 06/11/2024 whowed a moderate colonic burden.  The family gave her miralax and she did have multiple BM's.  I also noted this past year that her calcium  levels were elevated.  I wondered whether she was having primary hyperparathyroidism but on review of previous physician notes, she had a NM parathyroid  scan done on 04/2021 that was poositive SPECT CT exam for presence of a LEFT superior parathyroid  adenoma.  Her daughter states they did arrange for surgery but the patient had problems with her heart at that time and the parathyroid  was put on the back burner.  Today, her confusioin did improve some but she has  not gone back to baseline as of yet.      Past Medical History Past Medical History:  Diagnosis Date   Abnormal echocardiogram 08/25/2022   Aortic atherosclerosis 06/11/2024   BMI 25.0-25.9,adult 06/11/2024   CHEST PAIN UNSPECIFIED 02/11/2010   Qualifier: Diagnosis of   By: Delford, MD, CODY Maude Dunnings        Chronic systolic (congestive) heart failure (HCC) 06/11/2024   Colon cancer (HCC) 12/09/2013   Confusion 06/11/2024   Constipation 06/11/2024   Coronary artery disease involving native coronary artery of native heart without angina pectoris 10/19/2022   Essential hypertension 06/11/2024   GERD (gastroesophageal reflux disease)    History of colon cancer    History of lymphoma    History of radioactive iodine thyroid  ablation 06/24/2021   Hypercalcemia    Hyperparathyroidism , secondary, non-renal    Hypocalcemia 06/11/2014   Hypothyroidism, acquired, autoimmune    Iron deficiency    Lung nodule 10/19/2022   Lymphoma, mantle cell, multiple sites (HCC)    Mixed hyperlipidemia 02/11/2010   Qualifier: Diagnosis of   By: Delford, MD, CODY Maude Dunnings        Nontoxic multinodular goiter    NSTEMI (non-ST elevated myocardial infarction) (HCC) 08/25/2022   Obesity (BMI 30.0-34.9)  Other osteoporosis    Pallor    Pleural effusion, right 07/07/2021   Primary hyperparathyroidism 06/24/2021   Pulmonary nodule 06/11/2024   Thoracic aortic aneurysm (TAA) 10/12/2010   Qualifier: Diagnosis of   By: Pietro, MD, CODY Redell Dimes        Thyroiditis, autoimmune    Toxic nodular goiter    Vitamin D  deficiency disease      Allergies Allergies  Allergen Reactions   Codeine Other (See Comments)    unk   Latex Rash     Medications  Current Outpatient Medications:    aspirin  81 MG chewable tablet, Chew 81 mg by mouth every evening., Disp: , Rfl:    calcitRIOL  (ROCALTROL ) 0.25 MCG capsule, TAKE 1 CAPSULE EVERY DAY MORNING, Disp: 30 capsule, Rfl: 11   carvedilol   (COREG ) 6.25 MG tablet, Take 1 tablet (6.25 mg total) by mouth 2 (two) times daily with a meal., Disp: 180 tablet, Rfl: 1   ezetimibe  (ZETIA ) 10 MG tablet, Take 1 tablet (10 mg total) by mouth every evening., Disp: 90 tablet, Rfl: 3   fesoterodine  (TOVIAZ ) 4 MG TB24 tablet, Take 1 tablet (4 mg total) by mouth daily., Disp: 90 tablet, Rfl: 3   losartan (COZAAR) 50 MG tablet, Take 1 tablet (50 mg total) by mouth daily., Disp: 90 tablet, Rfl: 3   polyethylene glycol powder (MIRALAX) 17 GM/SCOOP powder, Take 17 g by mouth daily. Dissolve 1 capful (17g) in 4-8 ounces of liquid and take by mouth daily., Disp: 238 g, Rfl: 0   pravastatin  (PRAVACHOL ) 40 MG tablet, Take 1 tablet (40 mg total) by mouth at bedtime., Disp: 90 tablet, Rfl: 3   senna (SENOKOT) 8.6 MG TABS tablet, Take 2 tablets (17.2 mg total) by mouth daily., Disp: 120 tablet, Rfl: 0   spironolactone  (ALDACTONE ) 25 MG tablet, Take 0.5 tablets (12.5 mg total) by mouth daily., Disp: 45 tablet, Rfl: 1   Review of Systems Review of Systems  Constitutional:  Negative for chills and fever.  Respiratory:  Negative for shortness of breath.   Cardiovascular:  Negative for chest pain, palpitations and leg swelling.  Gastrointestinal:  Negative for abdominal pain, constipation, diarrhea, nausea and vomiting.  Musculoskeletal:  Negative for myalgias.  Neurological:  Positive for dizziness. Negative for weakness and headaches.       Objective:    Vitals BP (!) 140/72   Pulse 76   Temp (!) 97.4 F (36.3 C) (Temporal)   Resp 16   Ht 5' 1 (1.549 m)   Wt 133 lb (60.3 kg)   SpO2 97%   BMI 25.13 kg/m    Physical Examination Physical Exam Constitutional:      Appearance: Normal appearance. She is not ill-appearing.  HENT:     Head:     Comments: I feel a nodule the size of a pea in her left superior aspect lobe of her thyroid . Cardiovascular:     Rate and Rhythm: Normal rate and regular rhythm.     Pulses: Normal pulses.     Heart  sounds: No murmur heard.    No friction rub. No gallop.  Pulmonary:     Effort: Pulmonary effort is normal. No respiratory distress.     Breath sounds: No wheezing, rhonchi or rales.  Abdominal:     General: Bowel sounds are normal. There is no distension.     Palpations: Abdomen is soft.     Tenderness: There is no abdominal tenderness.  Musculoskeletal:     Right lower  leg: No edema.     Left lower leg: No edema.  Skin:    General: Skin is warm and dry.     Findings: No rash.  Neurological:     Mental Status: She is alert.        Assessment & Plan:   Hyperparathyroidism I am still going to do a repeat CMP and PTH.  She has a NM parathyroid  scan in 04/2021 and she needs to have this adenoma removed.  We will refer her to general surgery.    Return in about 3 months (around 10/09/2024).   Selinda Fleeta Finger, MD

## 2024-07-10 LAB — PTH, INTACT AND CALCIUM: PTH: 58 pg/mL (ref 15–65)

## 2024-07-10 LAB — CMP14 + ANION GAP
ALT: 12 IU/L (ref 0–32)
AST: 16 IU/L (ref 0–40)
Albumin: 4.3 g/dL (ref 3.7–4.7)
Alkaline Phosphatase: 83 IU/L (ref 48–129)
Anion Gap: 14 mmol/L (ref 10.0–18.0)
BUN/Creatinine Ratio: 13 (ref 12–28)
BUN: 14 mg/dL (ref 8–27)
Bilirubin Total: 1.3 mg/dL — ABNORMAL HIGH (ref 0.0–1.2)
CO2: 26 mmol/L (ref 20–29)
Calcium: 11.6 mg/dL — ABNORMAL HIGH (ref 8.7–10.3)
Chloride: 102 mmol/L (ref 96–106)
Creatinine, Ser: 1.12 mg/dL — ABNORMAL HIGH (ref 0.57–1.00)
Globulin, Total: 2.8 g/dL (ref 1.5–4.5)
Glucose: 103 mg/dL — ABNORMAL HIGH (ref 70–99)
Potassium: 4.1 mmol/L (ref 3.5–5.2)
Sodium: 142 mmol/L (ref 134–144)
Total Protein: 7.1 g/dL (ref 6.0–8.5)
eGFR: 48 mL/min/1.73 — ABNORMAL LOW (ref >59)

## 2024-07-15 DIAGNOSIS — K5900 Constipation, unspecified: Secondary | ICD-10-CM | POA: Diagnosis not present

## 2024-07-15 DIAGNOSIS — M810 Age-related osteoporosis without current pathological fracture: Secondary | ICD-10-CM | POA: Diagnosis not present

## 2024-07-15 DIAGNOSIS — I2510 Atherosclerotic heart disease of native coronary artery without angina pectoris: Secondary | ICD-10-CM | POA: Diagnosis not present

## 2024-07-15 DIAGNOSIS — E069 Thyroiditis, unspecified: Secondary | ICD-10-CM | POA: Diagnosis not present

## 2024-07-15 DIAGNOSIS — I700 Atherosclerosis of aorta: Secondary | ICD-10-CM | POA: Diagnosis not present

## 2024-07-15 DIAGNOSIS — I509 Heart failure, unspecified: Secondary | ICD-10-CM | POA: Diagnosis not present

## 2024-07-15 DIAGNOSIS — J449 Chronic obstructive pulmonary disease, unspecified: Secondary | ICD-10-CM | POA: Diagnosis not present

## 2024-07-15 DIAGNOSIS — G3184 Mild cognitive impairment, so stated: Secondary | ICD-10-CM | POA: Diagnosis not present

## 2024-07-15 DIAGNOSIS — I429 Cardiomyopathy, unspecified: Secondary | ICD-10-CM | POA: Diagnosis not present

## 2024-07-15 DIAGNOSIS — Z87440 Personal history of urinary (tract) infections: Secondary | ICD-10-CM | POA: Diagnosis not present

## 2024-07-15 DIAGNOSIS — Z9989 Dependence on other enabling machines and devices: Secondary | ICD-10-CM | POA: Diagnosis not present

## 2024-07-15 DIAGNOSIS — E0590 Thyrotoxicosis, unspecified without thyrotoxic crisis or storm: Secondary | ICD-10-CM | POA: Diagnosis not present

## 2024-07-15 DIAGNOSIS — R2689 Other abnormalities of gait and mobility: Secondary | ICD-10-CM | POA: Diagnosis not present

## 2024-07-15 DIAGNOSIS — Z885 Allergy status to narcotic agent status: Secondary | ICD-10-CM | POA: Diagnosis not present

## 2024-07-15 DIAGNOSIS — I719 Aortic aneurysm of unspecified site, without rupture: Secondary | ICD-10-CM | POA: Diagnosis not present

## 2024-07-15 DIAGNOSIS — K219 Gastro-esophageal reflux disease without esophagitis: Secondary | ICD-10-CM | POA: Diagnosis not present

## 2024-07-15 DIAGNOSIS — F325 Major depressive disorder, single episode, in full remission: Secondary | ICD-10-CM | POA: Diagnosis not present

## 2024-07-15 DIAGNOSIS — N189 Chronic kidney disease, unspecified: Secondary | ICD-10-CM | POA: Diagnosis not present

## 2024-07-15 DIAGNOSIS — E785 Hyperlipidemia, unspecified: Secondary | ICD-10-CM | POA: Diagnosis not present

## 2024-07-15 DIAGNOSIS — I130 Hypertensive heart and chronic kidney disease with heart failure and stage 1 through stage 4 chronic kidney disease, or unspecified chronic kidney disease: Secondary | ICD-10-CM | POA: Diagnosis not present

## 2024-07-15 DIAGNOSIS — E213 Hyperparathyroidism, unspecified: Secondary | ICD-10-CM | POA: Diagnosis not present

## 2024-07-22 DIAGNOSIS — D351 Benign neoplasm of parathyroid gland: Secondary | ICD-10-CM | POA: Diagnosis not present

## 2024-07-22 DIAGNOSIS — E210 Primary hyperparathyroidism: Secondary | ICD-10-CM | POA: Diagnosis not present

## 2024-08-06 ENCOUNTER — Ambulatory Visit

## 2024-08-06 DIAGNOSIS — I2510 Atherosclerotic heart disease of native coronary artery without angina pectoris: Secondary | ICD-10-CM

## 2024-08-06 LAB — ECHOCARDIOGRAM COMPLETE
AV Vena cont: 0.2 cm
Area-P 1/2: 3.93 cm2
P 1/2 time: 460 ms
S' Lateral: 2.5 cm

## 2024-08-07 ENCOUNTER — Ambulatory Visit: Payer: Self-pay

## 2024-08-07 NOTE — Progress Notes (Signed)
 Patient called.  Unable to reach patient.  Per Dr. Fleeta Finger She has hyperparathyroidism.  Is she going to see the surgeon regarding her high calcium  level? Joy Daniels

## 2024-08-22 DIAGNOSIS — E213 Hyperparathyroidism, unspecified: Secondary | ICD-10-CM | POA: Diagnosis not present

## 2024-08-22 DIAGNOSIS — E210 Primary hyperparathyroidism: Secondary | ICD-10-CM | POA: Diagnosis not present

## 2024-08-22 DIAGNOSIS — D351 Benign neoplasm of parathyroid gland: Secondary | ICD-10-CM | POA: Diagnosis not present

## 2024-08-26 NOTE — Progress Notes (Signed)
 Patient called.  Patient aware.  Per Dr. Fleeta Finger She has hyperparathyroidism.  Is she going to see the surgeon regarding her high calcium  level? Joy Daniels

## 2024-09-11 NOTE — Progress Notes (Signed)
 Patient called.  Patient aware.  I have spoke with Jori that patients daughter and informed her, Per Dr. Fleeta Finger She has hyperparathyroidism.  Is she going to see the surgeon regarding her high calcium  level? SABRA  Jori the patients daughter stated the patient is having the surgery on January 5th.

## 2024-10-04 ENCOUNTER — Ambulatory Visit

## 2024-10-09 ENCOUNTER — Ambulatory Visit: Admitting: Internal Medicine
